# Patient Record
Sex: Male | Born: 1980 | Race: White | Hispanic: No | Smoking: Never smoker
Health system: Southern US, Community
[De-identification: ages and names within clinical notes are randomized; demographics above are authoritative.]

## PROBLEM LIST (undated history)

## (undated) DIAGNOSIS — F319 Bipolar disorder, unspecified: Secondary | ICD-10-CM

## (undated) DIAGNOSIS — C9000 Multiple myeloma not having achieved remission: Secondary | ICD-10-CM

## (undated) DIAGNOSIS — F32A Depression, unspecified: Secondary | ICD-10-CM

## (undated) DIAGNOSIS — I10 Essential (primary) hypertension: Secondary | ICD-10-CM

## (undated) DIAGNOSIS — R7989 Other specified abnormal findings of blood chemistry: Secondary | ICD-10-CM

## (undated) DIAGNOSIS — N289 Disorder of kidney and ureter, unspecified: Secondary | ICD-10-CM

## (undated) DIAGNOSIS — I639 Cerebral infarction, unspecified: Secondary | ICD-10-CM

## (undated) DIAGNOSIS — G8929 Other chronic pain: Secondary | ICD-10-CM

## (undated) DIAGNOSIS — F4310 Post-traumatic stress disorder, unspecified: Secondary | ICD-10-CM

## (undated) DIAGNOSIS — F329 Major depressive disorder, single episode, unspecified: Secondary | ICD-10-CM

## (undated) DIAGNOSIS — I38 Endocarditis, valve unspecified: Secondary | ICD-10-CM

## (undated) HISTORY — PX: LEG SURGERY: SHX1003

## (undated) HISTORY — PX: HERNIA REPAIR: SHX51

## (undated) HISTORY — PX: OTHER SURGICAL HISTORY: SHX169

---

## 2005-03-24 ENCOUNTER — Emergency Department (HOSPITAL_COMMUNITY): Admission: EM | Admit: 2005-03-24 | Discharge: 2005-03-25 | Payer: Self-pay | Admitting: Emergency Medicine

## 2006-01-28 ENCOUNTER — Emergency Department (HOSPITAL_COMMUNITY): Admission: EM | Admit: 2006-01-28 | Discharge: 2006-01-29 | Payer: Self-pay | Admitting: Emergency Medicine

## 2008-02-07 ENCOUNTER — Observation Stay (HOSPITAL_COMMUNITY): Admission: EM | Admit: 2008-02-07 | Discharge: 2008-02-07 | Payer: Self-pay | Admitting: Emergency Medicine

## 2008-04-04 ENCOUNTER — Emergency Department (HOSPITAL_COMMUNITY): Admission: EM | Admit: 2008-04-04 | Discharge: 2008-04-05 | Payer: Self-pay | Admitting: Emergency Medicine

## 2009-07-30 IMAGING — CT CT HEAD W/O CM
1 of 3 series · 13 of 30 positions shown, 17 images · non-contrast
Comparison: None

CLINICAL DATA: Headache, hypertension

CT HEAD WITHOUT CONTRAST
TECHNIQUE: Contiguous axial images were obtained from the base of
the skull through the vertex without contrast.

[Series 2: head_seq 4.5 h37s st · axial · 0.47mm/px · z∈[+933,+1068]mm · 13 of 36 slices shown, 17 images]
[im 3/36  brain]
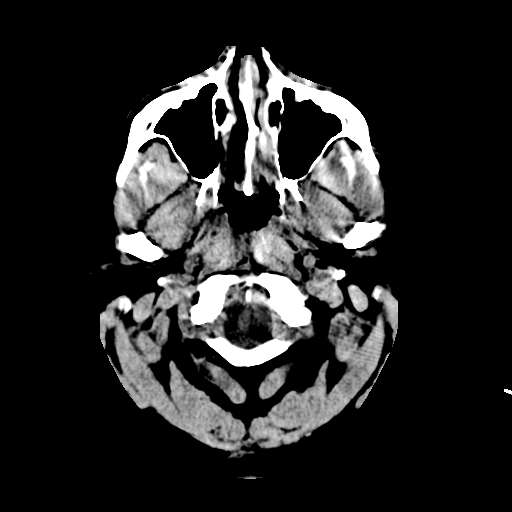
[im 3/36  bone]
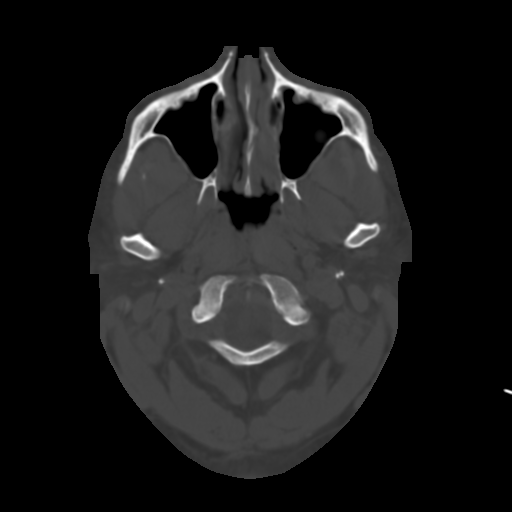
[im 6/36  brain]
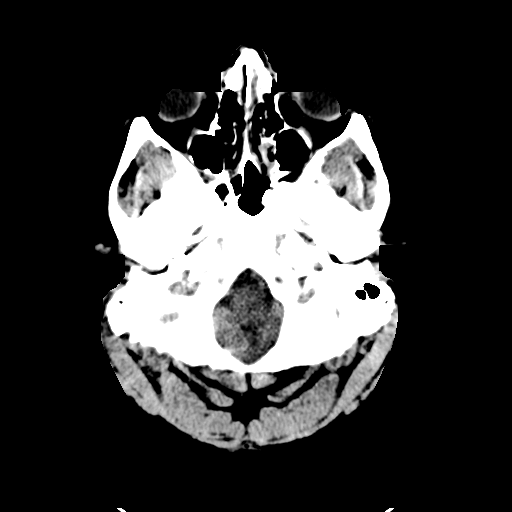
[im 8/36  brain]
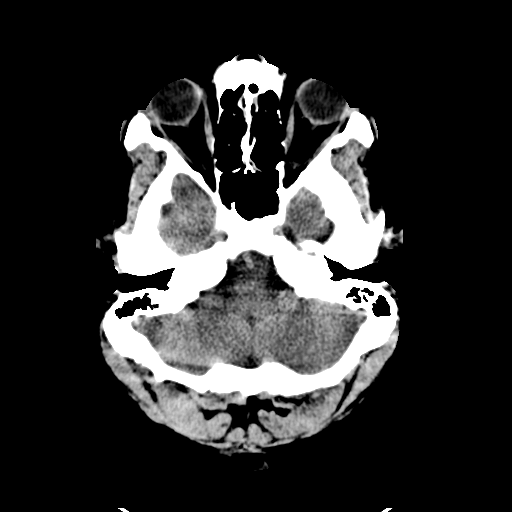
[im 11/36  brain]
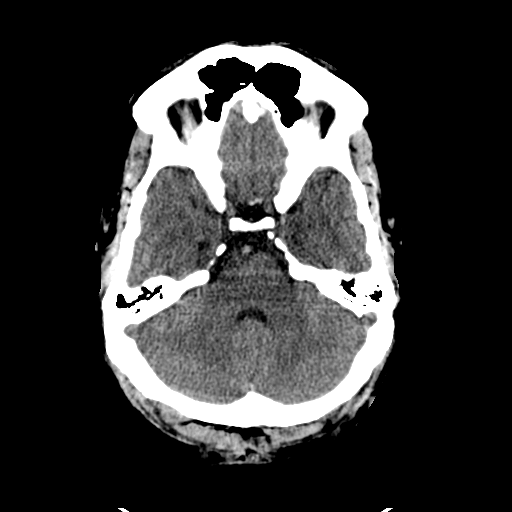
[im 13/36  brain]
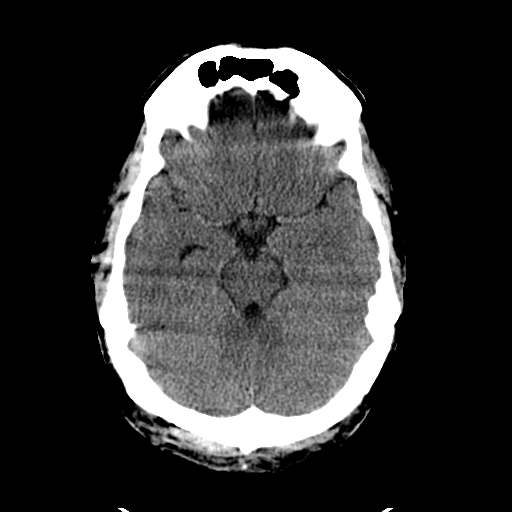
[im 13/36  bone]
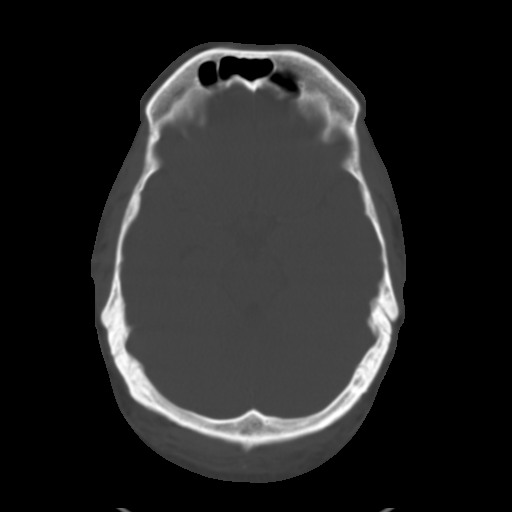
[im 16/36  brain]
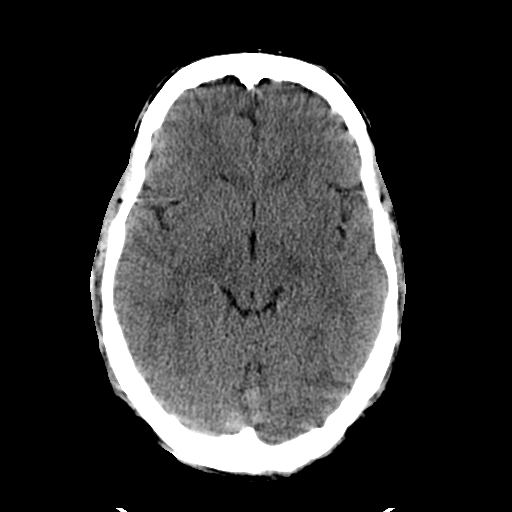
[im 18/36  brain]
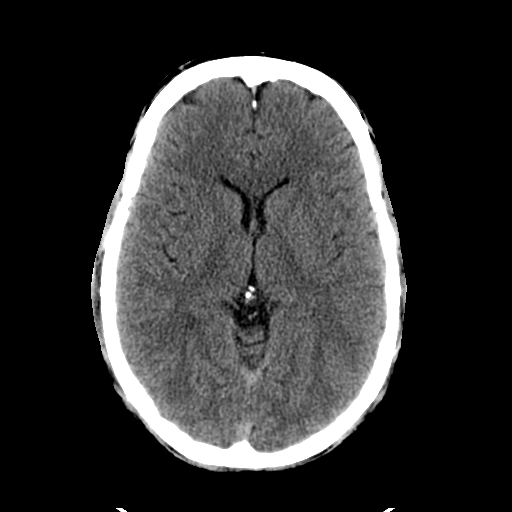
[im 21/36  brain]
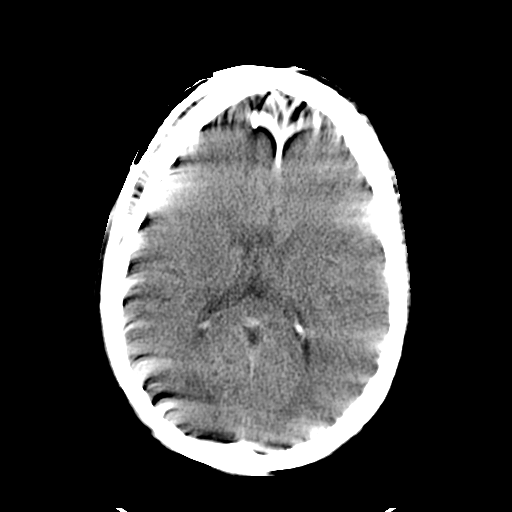
[im 23/36  brain]
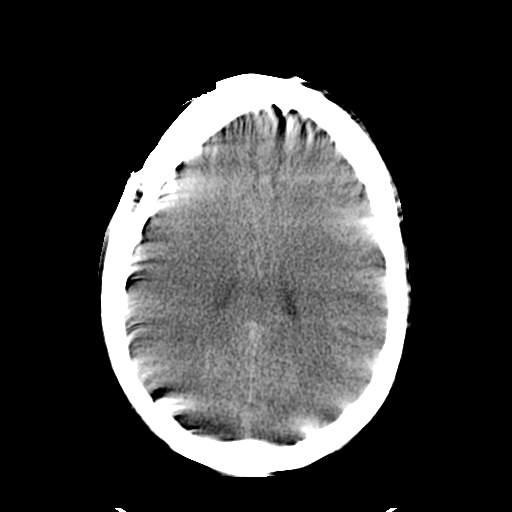
[im 23/36  bone]
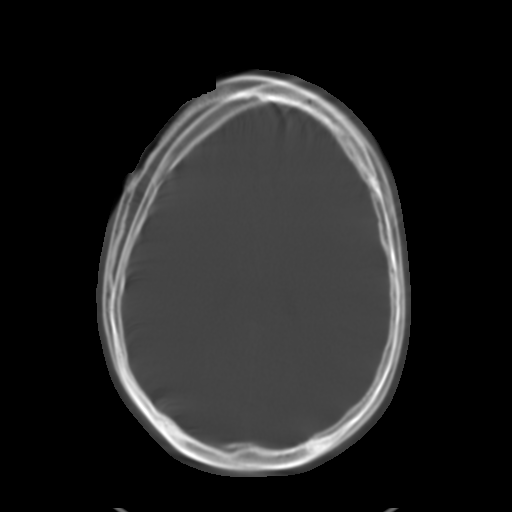
[im 26/36  brain]
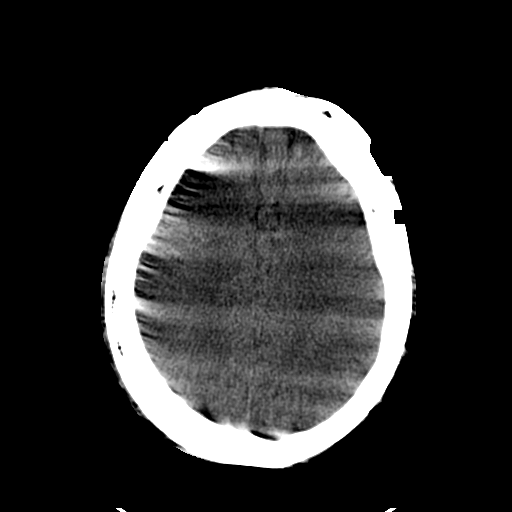
[im 28/36  brain]
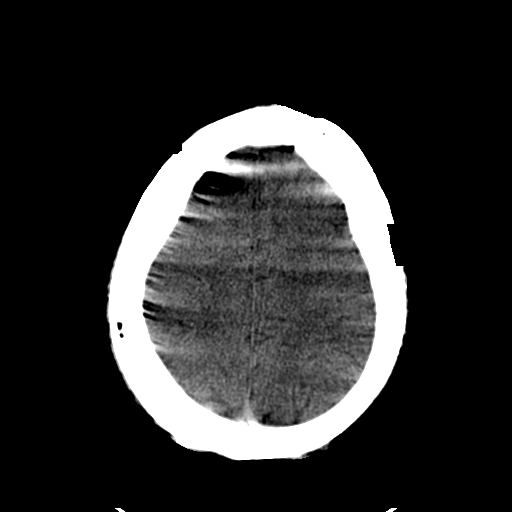
[im 31/36  brain]
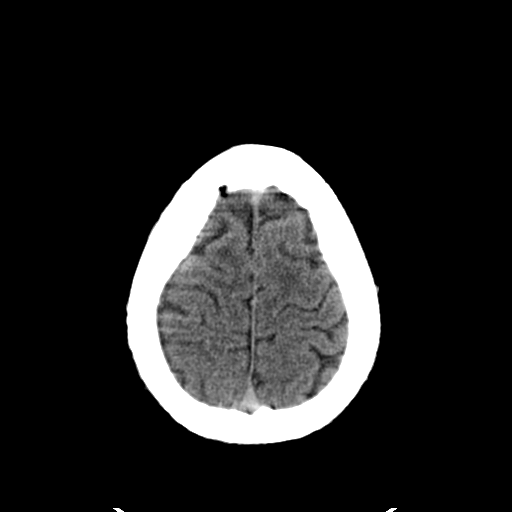
[im 33/36  brain]
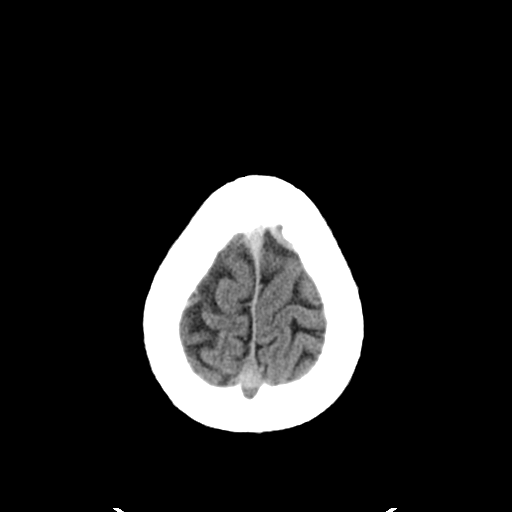
[im 33/36  bone]
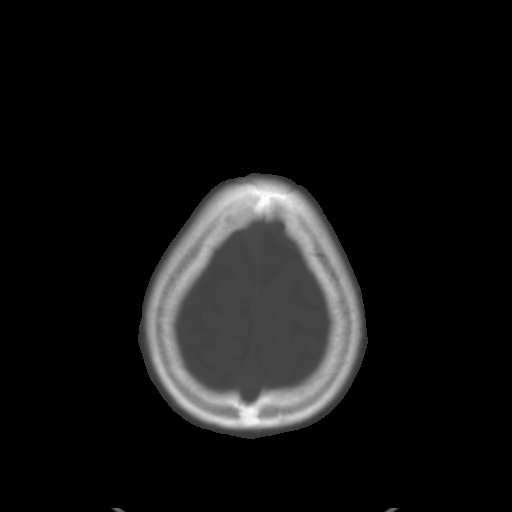

[13 of 30 positions shown; findings below may reference images not displayed]

FINDINGS: Motion artifacts, for which repeat imaging was performed.
Normal ventricular morphology.
No midline shift or mass effect.
Normal appearance of brain parenchyma.
No intracranial hemorrhage, mass lesion, or acute infarct.
Mucosal thickening throughout ethmoid air cells.
Small mucosal retention cyst in left maxillary sinus.
Bones unremarkable.
IMPRESSION: No acute intracranial abnormalities.

## 2009-11-08 ENCOUNTER — Encounter: Admission: RE | Admit: 2009-11-08 | Discharge: 2009-11-08 | Payer: Self-pay | Admitting: Psychiatry

## 2010-05-23 ENCOUNTER — Emergency Department (HOSPITAL_COMMUNITY): Admission: EM | Admit: 2010-05-23 | Discharge: 2010-05-23 | Payer: Self-pay | Admitting: Emergency Medicine

## 2010-06-24 ENCOUNTER — Emergency Department (HOSPITAL_COMMUNITY)
Admission: EM | Admit: 2010-06-24 | Discharge: 2010-06-24 | Payer: Self-pay | Source: Home / Self Care | Admitting: Emergency Medicine

## 2010-09-26 LAB — DIFFERENTIAL
Basophils Absolute: 0.1 10*3/uL (ref 0.0–0.1)
Basophils Absolute: 0.1 10*3/uL (ref 0.0–0.1)
Basophils Relative: 1 % (ref 0–1)
Basophils Relative: 1 % (ref 0–1)
Eosinophils Absolute: 0.3 10*3/uL (ref 0.0–0.7)
Eosinophils Absolute: 0.4 10*3/uL (ref 0.0–0.7)
Eosinophils Relative: 3 % (ref 0–5)
Eosinophils Relative: 4 % (ref 0–5)
Lymphocytes Relative: 29 % (ref 12–46)
Lymphocytes Relative: 34 % (ref 12–46)
Lymphs Abs: 2.8 10*3/uL (ref 0.7–4.0)
Lymphs Abs: 3.5 10*3/uL (ref 0.7–4.0)
Monocytes Absolute: 0.7 10*3/uL (ref 0.1–1.0)
Monocytes Absolute: 1.3 10*3/uL — ABNORMAL HIGH (ref 0.1–1.0)
Monocytes Relative: 10 % (ref 3–12)
Monocytes Relative: 9 % (ref 3–12)
Neutro Abs: 4.3 10*3/uL (ref 1.7–7.7)
Neutro Abs: 6.7 10*3/uL (ref 1.7–7.7)
Neutrophils Relative %: 53 % (ref 43–77)
Neutrophils Relative %: 56 % (ref 43–77)

## 2010-09-26 LAB — COMPREHENSIVE METABOLIC PANEL
ALT: 20 U/L (ref 0–53)
ALT: 56 U/L — ABNORMAL HIGH (ref 0–53)
AST: 14 U/L (ref 0–37)
AST: 33 U/L (ref 0–37)
Albumin: 3.5 g/dL (ref 3.5–5.2)
Albumin: 4.2 g/dL (ref 3.5–5.2)
Alkaline Phosphatase: 49 U/L (ref 39–117)
Alkaline Phosphatase: 67 U/L (ref 39–117)
BUN: 10 mg/dL (ref 6–23)
BUN: 9 mg/dL (ref 6–23)
CO2: 29 meq/L (ref 19–32)
CO2: 31 mEq/L (ref 19–32)
Calcium: 10 mg/dL (ref 8.4–10.5)
Calcium: 9.5 mg/dL (ref 8.4–10.5)
Chloride: 101 meq/L (ref 96–112)
Chloride: 105 meq/L (ref 96–112)
Creatinine, Ser: 0.87 mg/dL (ref 0.4–1.5)
Creatinine, Ser: 0.89 mg/dL (ref 0.4–1.5)
GFR calc Af Amer: >60 mL/min (ref >60)
GFR calc Af Amer: >60 mL/min (ref >60)
GFR calc non Af Amer: >60 mL/min (ref >60)
GFR calc non Af Amer: >60 mL/min (ref >60)
Glucose, Bld: 107 mg/dL — ABNORMAL HIGH (ref 70–99)
Glucose, Bld: 82 mg/dL (ref 70–99)
Potassium: 3.6 meq/L (ref 3.5–5.1)
Potassium: 3.7 meq/L (ref 3.5–5.1)
Sodium: 141 mEq/L (ref 135–145)
Sodium: 142 mEq/L (ref 135–145)
Total Bilirubin: 0.8 mg/dL (ref 0.3–1.2)
Total Bilirubin: 1 mg/dL (ref 0.3–1.2)
Total Protein: 6.4 g/dL (ref 6.0–8.3)
Total Protein: 7.4 g/dL (ref 6.0–8.3)

## 2010-09-26 LAB — CBC
HCT: 39.7 % (ref 39.0–52.0)
HCT: 41 % (ref 39.0–52.0)
Hemoglobin: 13.9 g/dL (ref 13.0–17.0)
Hemoglobin: 14.5 g/dL (ref 13.0–17.0)
MCH: 30.4 pg (ref 26.0–34.0)
MCH: 30.5 pg (ref 26.0–34.0)
MCHC: 35.1 g/dL (ref 30.0–36.0)
MCHC: 35.4 g/dL (ref 30.0–36.0)
MCV: 86 fL (ref 78.0–100.0)
MCV: 86.9 fL (ref 78.0–100.0)
Platelets: 285 10*3/uL (ref 150–400)
Platelets: 288 10*3/uL (ref 150–400)
RBC: 4.57 MIL/uL (ref 4.22–5.81)
RBC: 4.77 MIL/uL (ref 4.22–5.81)
RDW: 13.5 % (ref 11.5–15.5)
RDW: 14.1 % (ref 11.5–15.5)
WBC: 12 10*3/uL — ABNORMAL HIGH (ref 4.0–10.5)
WBC: 8.2 10*3/uL (ref 4.0–10.5)

## 2010-09-26 LAB — URINALYSIS, ROUTINE W REFLEX MICROSCOPIC
Bilirubin Urine: NEGATIVE
Glucose, UA: NEGATIVE mg/dL
Hgb urine dipstick: NEGATIVE
Ketones, ur: NEGATIVE mg/dL
Nitrite: NEGATIVE
Protein, ur: NEGATIVE mg/dL
Specific Gravity, Urine: 1.02 (ref 1.005–1.030)
Urobilinogen, UA: 0.2 mg/dL (ref 0.0–1.0)
pH: 5.5 (ref 5.0–8.0)

## 2010-09-26 LAB — RAPID URINE DRUG SCREEN, HOSP PERFORMED
Amphetamines: NOT DETECTED
Barbiturates: NOT DETECTED
Benzodiazepines: POSITIVE — AB
Cocaine: NOT DETECTED
Opiates: POSITIVE — AB
Tetrahydrocannabinol: NOT DETECTED

## 2010-09-26 LAB — URINE CULTURE
Colony Count: 15000
Culture  Setup Time: 201111090114

## 2010-09-26 LAB — ETHANOL: Alcohol, Ethyl (B): <5 mg/dL (ref 0–10)

## 2010-09-26 LAB — LIPASE, BLOOD: Lipase: 20 U/L (ref 11–59)

## 2010-09-26 LAB — ACETAMINOPHEN LEVEL: Acetaminophen (Tylenol), Serum: <10 ug/mL — ABNORMAL LOW (ref 10–30)

## 2010-11-28 NOTE — H&P (Signed)
Ronald Hampton, Ronald Hampton NO.:  [id number]   MEDICAL RECORD NO.:  [id number]          PATIENT TYPE:  EMS   LOCATION:  MAJO                         FACILITY:  [facility]   PHYSICIAN:  Lucita Ferrara, MD         DATE OF BIRTH:  19-Mar-1981   DATE OF ADMISSION:  02/06/2008  DATE OF DISCHARGE:                              HISTORY & PHYSICAL   HISTORY OF PRESENT ILLNESS:  The patient is a 30 year old unassigned  patient with severe abdominal pain.  Abdominal pain started suddenly and  acutely located in his left lower abdomen inguinal area, worse upon  cough and increase in intrathoracic pressure, 8/10 in intensity.  The  condition is aggravated by palpation, relieved by rest and sitting down  still.  The patient has had associated diarrhea, nausea and vomiting.  The patient has had no dysuria, flank pain, hematuria or testicular  pain.  There is no known trauma.   PAST MEDICAL HISTORY:  1. Hypertension.  2. Depression.   PAST SURGICAL HISTORY:  None.   SOCIAL HISTORY:  Denies drugs alcohol or tobacco.   ALLERGIES:  NO KNOWN DRUG ALLERGIES.   MEDICATIONS:  1. Lamictal.  2. Ambien.   PHYSICAL EXAMINATION:  GENERAL:  The patient is currently in better  comfort secondary to pain medications for the severe pain.  VITAL SIGNS:  Blood pressure is 134/92, pulse Ranging from 99-125,  respirations 20, temperature 97.40, pulse ox 97%.  CARDIOVASCULAR:  S1,  S2, regular rate and rhythm.  LUNGS:  Clear to auscultation bilaterally.  No rhonchi, rales or  wheezes.  Abdomen is soft.  There is left lower quadrant tenderness.  Tenderness is severe.  There is no mass.  There is no obvious herniation  that is felt.  Normal bowel sounds.  There is no guarding.  GENITOURINARY:  The scrotum is normal bilaterally.  There is no obvious  masses that are felt.   EMERGENCY ROOM COURSE:  A CT scan of the abdomen and testicular  ultrasound were ordered and results discussed with Dr. Lindie Spruce from  Surgery who actually officially came and saw the CT scan.  There is no  evidence of hernia per him, and the patient did not feel he had any  surgical issues at this time.  In review of the results, ultrasound of  the scrotum shows a small cyst in the right epididymis, questionable  epididymal cyst versus spermatocele.  CT scan without contrast media  shows no acute upper abdominal abnormalities.  There is fatty  infiltration.  CT scan of the pelvis shows bilateral spondylolysis.  No  acute intrapelvic abnormality.  Urinalysis is negative.  CBC is  pending..   ASSESSMENT/PLAN:  A 30 year old otherwise healthy male with severe left  lower quadrant inguinal abdominal pain, nausea and vomiting that is now  subsided.  Symptoms are none-surgical per Dr. Lindie Spruce in the emergency  room.  CT scan of the abdomen and pelvis essentially negative.  Ultrasound of the test shows no testicular torsion.  There is positive  spermatocele versus epididymal cyst.   Other stable medical problems including hypertension  and depression.   PLAN:  Will go ahead and admit the patient to medical surgical floor.  Will get a CBC.  Pain control with IV Dilaudid.  Urology consultation in  the morning.  The patient will likely be able to get discharged home on  pain medications in the next 1-2 days.      Lucita Ferrara, MD  Electronically Signed     RR/MEDQ  D:  02/07/2008  T:  02/07/2008  Job:  [id number]

## 2010-11-28 NOTE — Discharge Summary (Signed)
Ronald Hampton, Ronald Hampton NO.:  [id number]   MEDICAL RECORD NO.:  [id number]          PATIENT TYPE:  INP   LOCATION:  5120                         FACILITY:  [facility]   PHYSICIAN:  Altha Harm, MDDATE OF BIRTH:  [birth year]   DATE OF ADMISSION:  02/06/2008  DATE OF DISCHARGE:  02/07/2008                               DISCHARGE SUMMARY   DISCHARGE DISPOSITION:  Home.   FINAL DISCHARGE DIAGNOSES:  1. Right inguinal pain, query hernia.  2. Right epididymal cyst.  3. Chronic right lower quadrant pain.  4. Insomnia.  5. History of uncontrolled hypertension.   DISCHARGE MEDICATIONS:  1. Vicodin 1 tab p.o. q 6hrs as needed.  2. Lamictal 200 mg p.o. b.i.d.  3. Lexapro 20 mg p.o. daily.  4. Aspirin CR 1 tab p.o. nightly p.r.n.   CONSULTANTS:  1. Dr. Lynne Logan.  2. Dr. Nesi--urology.   PROCEDURES:  None.   DIAGNOSTIC STUDIES:  1. CT of the abdomen and pelvis without contrast, which shows the      following:  No acute abdominal abnormalities, fatty infiltrates to      the liver.  2. Bilateral spondylosis of L5, no acute intrapelvic abnormalities.  3. Ultrasound of the scrotum which shows small cyst in the right      epididymis.  Question epididymal cyst versus dermatocele.      Otherwise unremarkable scrotal ultrasound.   CODE STATUS:  Full code.   ALLERGIES:  NO KNOWN DRUG ALLERGIES.   CHIEF COMPLAINT:  Left lower quadrant inguinal pain.   HISTORY OF PRESENT ILLNESS:  Please see the H and P dictated by Dr.  Flonnie Overman for details of the HPI.   HOSPITAL COURSE:  This a patient who presented to the emergency room for  the second time in 1 week with complaints with left inguinal pain.  The  patient was evaluated in the emergency room by Dr. Lindie Spruce, surgery, and  per Dr. Flonnie Overman who admitted the patient last night.  Dr. Dixon Boos opinion  was that the patient did not require surgery at this time.   He, however, did recommend the patient be seen by urology for  evaluation  of the cyst found on ultrasound.  The patient apparently was having  significant pain and was admitted for pain control.  During this  hospitalization, the patient was seen and evaluated by Dr. Brunilda Payor,  urology, who felt that there was no intervention needed at this time and  I recommended that the patient follow back up with the surgeon.  The  patient had had no abnormalities in his labs.  He has had no problems  with urination.  He has had no problems with bowel movement.  He have  been functioning and able to ambulate without any difficulty.  The  patient has had no difficulty with eating, with drinking, and was  tolerating his food well.  Currently the patient has no acute issue  requiring hospitalization.  The patient has been advised of this primary  care physician and to go back to the surgeon for his hernia for clinical  follow up and possibility of arranging  his surgery electively.   CONDITION ON DISCHARGE:  The patient is afebrile.   VITAL SIGNS:  Temperature 98.8.  Heart rate 71.  Blood pressure 131/78.  Respiratory rate 50.  O2 sats 100% on room air.  LUNGS:  Clear to auscultation.  He has got no wheezes or rhonchi.  He  has got a normal S1 and S2, no murmurs, rubs, or gallops noted.  ABDOMEN:  Soft, nontender, nondistended.  He has no left inguinal  tenderness.  NEUROLOGIC:  He has no focal neurological deficits.  MUSCULOSKELETAL:  From a musculoskeletal stand point the patient is  doing apparently functionally.   DIETARY RESTRICTION:  None.   PHYSICAL RESTRICTIONS:  The patient has been advised against lifting.   FOLLOWUP:  The patient is to follow up with his primary care physician  for 3-5 days and specifically for referral back to his surgeon to follow  him up with his hernia.      Altha Harm, MD  Electronically Signed     MAM/MEDQ  D:  02/07/2008  T:  02/07/2008  Job:  [id number]

## 2010-11-28 NOTE — Consult Note (Signed)
Ronald Hampton, Ronald Hampton             ACCOUNT NO.:  [id number]   MEDICAL RECORD NO.:  [id number]          PATIENT TYPE:  INP   LOCATION:  5120                         FACILITY:  [facility]   PHYSICIAN:  Lindaann Slough, M.D.  DATE OF BIRTH:  07-Nov-1980   DATE OF CONSULTATION:  02/07/2008  DATE OF DISCHARGE:  02/07/2008                                 CONSULTATION   REASON FOR CONSULTATION:  Left groin pain and right testicular cysts.   The patient is a 30 year old male who was admitted last night with  history of severe left lower quadrant pain associated with nausea and  vomiting.  The patient states that he has been having left groin pain on  and off for several months.  After cutting grass yesterday, he had  severe left lower quadrant pain associated with nausea and vomiting.  He  came to the emergency room.  A CT scan showed no acute abdominal or  pelvic abnormalities.  Scrotal ultrasound showed no evidence of torsion,  normal testes, and right epididymal cysts, and the largest one measures  8 x 7 x 6 mm.  He was treated with analgesics; however, he still  complains of pain, and he now has pain in both testicles and left lower  quadrant, and he is requesting more pain medication.  He also complains  of decreased stream, straining on urination, and nocturia x1, although  at first, he had stated that he did not have any voiding symptoms.  He  has been having diarrhea for 3 weeks.  He had bilateral inguinal hernia  repairs in the past.   PAST MEDICAL HISTORY:  Positive for depression.   PAST SURGICAL HISTORY:  Bilateral inguinal hernia repairs.   MEDICATIONS:  Lamictal and Lexapro.   SOCIAL HISTORY:  Single, does not have any children.  Does not smoke.  Drinks occasionally.   ALLERGIES:  No known drug allergies.   FAMILY HISTORY:  His paternal grandfather and his father have history of  prostate cancer.  Maternal grandfather has history of heart disease  and  died of a heart  attack.   REVIEW OF SYSTEMS:  As noted in the HPI and it is otherwise negative.   PHYSICAL EXAMINATION:  This is a well-developed 30 year old male who is  complaining of severe left lower quadrant pain.  Temperature is 97.7,  blood pressure is 143/84, pulse 101, respirations 19.  He is alert and  oriented.  Skin is warm and dry.  Abdomen is soft and nondistended, but  tender in the left lower quadrant.  There is no rebound tenderness.  There is no hepatomegaly and no splenomegaly.  Kidneys are not palpable.  Bladder is not distended.  He has no inguinal adenopathy.   Penis is circumcised.  The meatus is normal.  Scrotum is normal in  appearance.  Both testicles feel normal.  There is no testicular mass.  I cannot feel the epididymal cyst on the right side, and there is no  evidence of epididymitis on the right, nor on the left side, and both  cords are normal.   Scrotal ultrasound shows normal testes,  good flow to both testicles, and  right epididymal cyst.   IMPRESSION:  Right epididymal cyst by ultrasound, left groin pain.   SUGGESTION:  I doubt that his symptoms are related to the epididymal  cyst.  He does not need surgical management of this cyst at this time.  We can follow him as an outpatient.      Lindaann Slough, M.D.  Electronically Signed     MN/MEDQ  D:  02/07/2008  T:  02/07/2008  Job:  [id number]

## 2011-04-13 LAB — COMPREHENSIVE METABOLIC PANEL
ALT: 49
AST: 28
Albumin: 4.5
Alkaline Phosphatase: 57
BUN: 7
CO2: 23
Calcium: 9.3
Chloride: 106
Creatinine, Ser: 0.91
GFR calc Af Amer: >60
GFR calc non Af Amer: >60
Glucose, Bld: 110 — ABNORMAL HIGH
Potassium: 3.7
Sodium: 138
Total Bilirubin: 1.2
Total Protein: 6.9

## 2011-04-13 LAB — CBC
HCT: 43.6
Hemoglobin: 14.9
MCHC: 34.2
MCV: 88.8
Platelets: 267
RBC: 4.91
RDW: 14.5
WBC: 9.8

## 2011-04-13 LAB — URINALYSIS, ROUTINE W REFLEX MICROSCOPIC
Bilirubin Urine: NEGATIVE
Glucose, UA: NEGATIVE
Hgb urine dipstick: NEGATIVE
Ketones, ur: NEGATIVE
Nitrite: NEGATIVE
Protein, ur: NEGATIVE
Specific Gravity, Urine: 1.026
Urobilinogen, UA: 0.2
pH: 5

## 2011-04-13 LAB — DIFFERENTIAL
Basophils Absolute: 0.1
Basophils Relative: 1
Eosinophils Absolute: 0.2
Eosinophils Relative: 2
Lymphocytes Relative: 30
Lymphs Abs: 2.9
Monocytes Absolute: 0.8
Monocytes Relative: 9
Neutro Abs: 5.7
Neutrophils Relative %: 59

## 2011-04-13 LAB — LACTIC ACID, PLASMA: Lactic Acid, Venous: 1.4

## 2011-04-16 LAB — URINALYSIS, ROUTINE W REFLEX MICROSCOPIC
Bilirubin Urine: NEGATIVE
Glucose, UA: 100 — AB
Hgb urine dipstick: NEGATIVE
Leukocytes, UA: NEGATIVE
Nitrite: NEGATIVE
Protein, ur: 100 — AB
Specific Gravity, Urine: 1.035 — ABNORMAL HIGH
Urobilinogen, UA: 1
pH: 5.5

## 2011-04-16 LAB — POCT I-STAT, CHEM 8
BUN: 13
Calcium, Ion: 1.17
Chloride: 102
Creatinine, Ser: 0.9
Glucose, Bld: 203 — ABNORMAL HIGH
HCT: 49
Hemoglobin: 16.7
Potassium: 3.9
Sodium: 135
TCO2: 25

## 2011-04-16 LAB — CBC
HCT: 47
Hemoglobin: 16.3
MCHC: 34.6
MCV: 88.5
Platelets: 291
RBC: 5.31
RDW: 13.7
WBC: 13.3 — ABNORMAL HIGH

## 2011-04-16 LAB — DIFFERENTIAL
Basophils Absolute: 0.1
Basophils Relative: 1
Eosinophils Absolute: 0
Eosinophils Relative: 0
Lymphocytes Relative: 13
Lymphs Abs: 1.7
Monocytes Absolute: 1
Monocytes Relative: 7
Neutro Abs: 10.5 — ABNORMAL HIGH
Neutrophils Relative %: 79 — ABNORMAL HIGH

## 2011-04-16 LAB — URINE MICROSCOPIC-ADD ON

## 2012-11-16 ENCOUNTER — Encounter (HOSPITAL_COMMUNITY): Payer: Self-pay | Admitting: *Deleted

## 2012-11-16 ENCOUNTER — Emergency Department (HOSPITAL_COMMUNITY)
Admission: EM | Admit: 2012-11-16 | Discharge: 2012-11-17 | Disposition: A | Discharge status: Other Acute Inpatient Hospital | Payer: Self-pay | Attending: Emergency Medicine | Admitting: Emergency Medicine

## 2012-11-16 ENCOUNTER — Emergency Department (HOSPITAL_COMMUNITY): Payer: Federal, State, Local not specified - Other

## 2012-11-16 DIAGNOSIS — M79609 Pain in unspecified limb: Secondary | ICD-10-CM | POA: Insufficient documentation

## 2012-11-16 DIAGNOSIS — R45851 Suicidal ideations: Secondary | ICD-10-CM

## 2012-11-16 DIAGNOSIS — F411 Generalized anxiety disorder: Secondary | ICD-10-CM | POA: Insufficient documentation

## 2012-11-16 DIAGNOSIS — F3289 Other specified depressive episodes: Secondary | ICD-10-CM | POA: Insufficient documentation

## 2012-11-16 DIAGNOSIS — M79672 Pain in left foot: Secondary | ICD-10-CM

## 2012-11-16 DIAGNOSIS — R7401 Elevation of levels of liver transaminase levels: Secondary | ICD-10-CM | POA: Insufficient documentation

## 2012-11-16 DIAGNOSIS — Z008 Encounter for other general examination: Secondary | ICD-10-CM | POA: Insufficient documentation

## 2012-11-16 DIAGNOSIS — R7402 Elevation of levels of lactic acid dehydrogenase (LDH): Secondary | ICD-10-CM | POA: Insufficient documentation

## 2012-11-16 DIAGNOSIS — F32A Depression, unspecified: Secondary | ICD-10-CM

## 2012-11-16 LAB — RAPID URINE DRUG SCREEN, HOSP PERFORMED
Amphetamines: NOT DETECTED
Barbiturates: NOT DETECTED
Benzodiazepines: NOT DETECTED
Cocaine: NOT DETECTED
Opiates: POSITIVE — AB
Tetrahydrocannabinol: NOT DETECTED

## 2012-11-16 LAB — CBC WITH DIFFERENTIAL/PLATELET
Basophils Absolute: 0.1 10*3/uL (ref 0.0–0.1)
Basophils Relative: 1 % (ref 0–1)
Eosinophils Absolute: 0.2 10*3/uL (ref 0.0–0.7)
Eosinophils Relative: 2 % (ref 0–5)
HCT: 41.2 % (ref 39.0–52.0)
Hemoglobin: 14.9 g/dL (ref 13.0–17.0)
Lymphocytes Relative: 27 % (ref 12–46)
Lymphs Abs: 2.8 10*3/uL (ref 0.7–4.0)
MCH: 29 pg (ref 26.0–34.0)
MCHC: 36.2 g/dL — ABNORMAL HIGH (ref 30.0–36.0)
MCV: 80.2 fL (ref 78.0–100.0)
Monocytes Absolute: 0.8 10*3/uL (ref 0.1–1.0)
Monocytes Relative: 7 % (ref 3–12)
Neutro Abs: 6.6 10*3/uL (ref 1.7–7.7)
Neutrophils Relative %: 64 % (ref 43–77)
Platelets: 238 10*3/uL (ref 150–400)
RBC: 5.14 MIL/uL (ref 4.22–5.81)
RDW: 14.3 % (ref 11.5–15.5)
WBC: 10.5 10*3/uL (ref 4.0–10.5)

## 2012-11-16 LAB — COMPREHENSIVE METABOLIC PANEL
ALT: 249 U/L — ABNORMAL HIGH (ref 0–53)
AST: 118 U/L — ABNORMAL HIGH (ref 0–37)
Albumin: 4 g/dL (ref 3.5–5.2)
Alkaline Phosphatase: 94 U/L (ref 39–117)
BUN: 9 mg/dL (ref 6–23)
CO2: 25 meq/L (ref 19–32)
Calcium: 9.7 mg/dL (ref 8.4–10.5)
Chloride: 103 meq/L (ref 96–112)
Creatinine, Ser: 0.7 mg/dL (ref 0.50–1.35)
GFR calc Af Amer: >90 mL/min (ref >90)
GFR calc non Af Amer: >90 mL/min (ref >90)
Glucose, Bld: 156 mg/dL — ABNORMAL HIGH (ref 70–99)
Potassium: 3.4 meq/L — ABNORMAL LOW (ref 3.5–5.1)
Sodium: 138 meq/L (ref 135–145)
Total Bilirubin: 0.9 mg/dL (ref 0.3–1.2)
Total Protein: 7.7 g/dL (ref 6.0–8.3)

## 2012-11-16 LAB — ETHANOL: Alcohol, Ethyl (B): <11 mg/dL (ref 0–11)

## 2012-11-16 MED ADMIN — Hydrocodone-Acetaminophen Tab 5-325 MG: 1 | ORAL | NDC 00406036523

## 2012-11-16 MED FILL — Hydrocodone-Acetaminophen Tab 5-325 MG: 1.0000 | ORAL | Qty: 1 | Status: AC

## 2012-11-17 ENCOUNTER — Encounter (HOSPITAL_COMMUNITY): Payer: Self-pay | Admitting: *Deleted

## 2012-11-17 ENCOUNTER — Inpatient Hospital Stay (HOSPITAL_COMMUNITY)
Admission: AD | Admit: 2012-11-17 | Discharge: 2012-11-27 | DRG: 885 | Disposition: A | Discharge status: Home / Self Care | Payer: Federal, State, Local not specified - Other | Source: Ambulatory Visit | Attending: Psychiatry | Admitting: Psychiatry

## 2012-11-17 ENCOUNTER — Encounter (HOSPITAL_COMMUNITY): Payer: Self-pay

## 2012-11-17 DIAGNOSIS — F329 Major depressive disorder, single episode, unspecified: Secondary | ICD-10-CM

## 2012-11-17 DIAGNOSIS — R45851 Suicidal ideations: Secondary | ICD-10-CM

## 2012-11-17 DIAGNOSIS — F333 Major depressive disorder, recurrent, severe with psychotic symptoms: Principal | ICD-10-CM

## 2012-11-17 DIAGNOSIS — Z79899 Other long term (current) drug therapy: Secondary | ICD-10-CM

## 2012-11-17 MED ADMIN — Ibuprofen Tab 200 MG: 400 mg | ORAL | NDC 63739013401

## 2012-11-17 MED ADMIN — Zolpidem Tartrate Tab 10 MG: 10 mg | ORAL | NDC 51079072401

## 2012-11-17 MED ADMIN — Ondansetron HCl Tab 4 MG: 4 mg | ORAL | NDC 51079052401

## 2012-11-17 MED ADMIN — Oxycodone HCl Tab 5 MG: 5 mg | ORAL | NDC 68084035411

## 2012-11-17 MED ADMIN — Alprazolam Tab 0.5 MG: 0.5 mg | ORAL | NDC 00904585961

## 2012-11-17 MED ADMIN — Nicotine TD Patch 24HR 21 MG/24HR: 21 mg | TRANSDERMAL | NDC 00067512607

## 2012-11-17 MED FILL — Clonidine HCl Tab 0.1 MG: 0.1000 mg | ORAL | Qty: 1 | Status: AC

## 2012-11-17 MED FILL — Quetiapine Fumarate Tab 50 MG: 50.0000 mg | ORAL | Qty: 1 | Status: AC

## 2012-11-17 MED FILL — Quetiapine Fumarate Tab 50 MG: 50.0000 mg | ORAL | Qty: 1 | Status: CN

## 2012-11-17 MED FILL — Naproxen Tab 500 MG: 500.0000 mg | ORAL | Qty: 1 | Status: AC

## 2012-11-17 MED FILL — Ibuprofen Tab 200 MG: 400.0000 mg | ORAL | Qty: 2 | Status: AC

## 2012-11-17 MED FILL — Methocarbamol Tab 500 MG: 500.0000 mg | ORAL | Qty: 1 | Status: AC

## 2012-11-17 MED FILL — Methocarbamol Tab 500 MG: 500.0000 mg | ORAL | Qty: 1 | Status: CN

## 2012-11-17 MED FILL — Oxycodone HCl Tab 5 MG: 5.0000 mg | ORAL | Qty: 1 | Status: AC

## 2012-11-17 MED FILL — Nicotine TD Patch 24HR 14 MG/24HR: 14.0000 mg | TRANSDERMAL | Qty: 1 | Status: AC

## 2012-11-17 MED FILL — Nicotine TD Patch 24HR 14 MG/24HR: 14.0000 mg | TRANSDERMAL | Qty: 1 | Status: CN

## 2012-11-17 MED FILL — Alprazolam Tab 0.5 MG: 0.5000 mg | ORAL | Qty: 1 | Status: AC

## 2012-11-17 MED FILL — Zolpidem Tartrate Tab 5 MG: 10.0000 mg | ORAL | Qty: 1 | Status: AC

## 2012-11-17 MED FILL — Clonidine TD Patch Weekly 0.1 MG/24HR: 0.1000 mg | TRANSDERMAL | Qty: 1 | Status: AC

## 2012-11-17 MED FILL — Ondansetron HCl Tab 4 MG: 4.0000 mg | ORAL | Qty: 1 | Status: AC

## 2012-11-17 MED FILL — Trazodone HCl Tab 50 MG: 50.0000 mg | ORAL | Qty: 1 | Status: AC

## 2012-11-17 MED FILL — Oxycodone w/ Acetaminophen Tab 5-325 MG: 1.0000 | ORAL | Qty: 2 | Status: AC

## 2012-11-18 DIAGNOSIS — F4310 Post-traumatic stress disorder, unspecified: Secondary | ICD-10-CM

## 2012-11-18 MED ADMIN — Naproxen Tab 500 MG: 500 mg | ORAL | NDC 68084012711

## 2012-11-18 MED ADMIN — Naproxen Tab 500 MG: 500 mg | ORAL | NDC 53746019001

## 2012-11-18 MED ADMIN — Clonidine HCl Tab 0.1 MG: 0.1 mg | ORAL | NDC 51079029901

## 2012-11-18 MED ADMIN — Sertraline HCl Tab 25 MG: 25 mg | ORAL | NDC 51079014920

## 2012-11-18 MED ADMIN — Oxycodone w/ Acetaminophen Tab 5-325 MG: 2 | ORAL | NDC 00406051223

## 2012-11-18 MED ADMIN — Hydroxyzine HCl Tab 25 MG: 25 mg | ORAL | NDC 63739048610

## 2012-11-18 MED ADMIN — Quetiapine Fumarate Tab 50 MG: 50 mg | ORAL | NDC 68084053111

## 2012-11-18 MED ADMIN — Prazosin HCl Cap 1 MG: 1 mg | ORAL | NDC 51079063001

## 2012-11-18 MED ADMIN — Clonidine HCl TD Patch Weekly 0.1 MG/24HR: 0.1 mg | TRANSDERMAL | NDC 00555100916

## 2012-11-18 MED FILL — Sertraline HCl Tab 25 MG: 25.0000 mg | ORAL | Qty: 1 | Status: AC

## 2012-11-18 MED FILL — Prazosin HCl Cap 1 MG: 1.0000 mg | ORAL | Qty: 1 | Status: AC

## 2012-11-18 MED FILL — Prazosin HCl Cap 1 MG: 1.0000 mg | ORAL | Qty: 1 | Status: CN

## 2012-11-18 MED FILL — Clonidine HCl Tab 0.1 MG: 0.1000 mg | ORAL | Qty: 1 | Status: AC

## 2012-11-18 MED FILL — Clonidine HCl Tab 0.1 MG: 0.1000 mg | ORAL | Qty: 1 | Status: CN

## 2012-11-18 MED FILL — Oxycodone w/ Acetaminophen Tab 5-325 MG: 1.0000 | ORAL | Qty: 2 | Status: AC

## 2012-11-19 DIAGNOSIS — F332 Major depressive disorder, recurrent severe without psychotic features: Secondary | ICD-10-CM

## 2012-11-19 DIAGNOSIS — F333 Major depressive disorder, recurrent, severe with psychotic symptoms: Secondary | ICD-10-CM | POA: Diagnosis present

## 2012-11-19 DIAGNOSIS — F411 Generalized anxiety disorder: Secondary | ICD-10-CM

## 2012-11-19 MED ADMIN — Prazosin HCl Cap 1 MG: 1 mg | ORAL | NDC 51079063001

## 2012-11-19 MED ADMIN — Oxycodone w/ Acetaminophen Tab 5-325 MG: 1 | ORAL | NDC 00054865024

## 2012-11-19 MED ADMIN — Naproxen Tab 500 MG: 500 mg | ORAL | NDC 68084012711

## 2012-11-19 MED ADMIN — Naproxen Tab 500 MG: 500 mg | ORAL | NDC 53746019001

## 2012-11-19 MED ADMIN — Clonidine HCl Tab 0.1 MG: 0.1 mg | ORAL | NDC 00904565661

## 2012-11-19 MED ADMIN — Clonidine HCl Tab 0.1 MG: 0.1 mg | ORAL | NDC 51079029901

## 2012-11-19 MED ADMIN — Quetiapine Fumarate Tab 100 MG: 100 mg | ORAL | NDC 68084053111

## 2012-11-19 MED ADMIN — Quetiapine Fumarate Tab 100 MG: 100 mg | ORAL | NDC 68084053211

## 2012-11-19 MED ADMIN — Sertraline HCl Tab 25 MG: 25 mg | ORAL | NDC 51079014920

## 2012-11-19 MED ADMIN — Oxycodone w/ Acetaminophen Tab 5-325 MG: 2 | ORAL | NDC 00054865024

## 2012-11-19 MED ADMIN — Hydroxyzine HCl Tab 25 MG: 25 mg | ORAL | NDC 63739048610

## 2012-11-19 MED ADMIN — Methocarbamol Tab 500 MG: 500 mg | ORAL | NDC 63739016610

## 2012-11-19 MED FILL — Prazosin HCl Cap 1 MG: 1.0000 mg | ORAL | Qty: 1 | Status: AC

## 2012-11-19 MED FILL — Prazosin HCl Cap 1 MG: 1.0000 mg | ORAL | Qty: 1 | Status: CN

## 2012-11-19 MED FILL — Multiple Vitamin Tab: 1.0000 | ORAL | Qty: 1 | Status: AC

## 2012-11-19 MED FILL — Quetiapine Fumarate Tab 100 MG: 100.0000 mg | ORAL | Qty: 1 | Status: AC

## 2012-11-19 MED FILL — Quetiapine Fumarate Tab 100 MG: 100.0000 mg | ORAL | Qty: 1 | Status: CN

## 2012-11-19 MED FILL — Oxycodone w/ Acetaminophen Tab 5-325 MG: 1.0000 | ORAL | Qty: 1 | Status: AC

## 2012-11-19 NOTE — Progress Notes (Signed)
[facility] MD Progress Note  11/19/2012 4:12 PM Ronald Hampton  MRN:  [id number] Subjective:  10/10 depression with no suicidal thoughts, 8/10 anxiety.  Patient requested an increase in his Seroquel for sleep since he could not have his Ambien.  He also asked for his ten o'clock medications to be moved to 8:30 to help him sleep better.  Frequently asking for PRN medications, he did state the Naprosyn is helping his pain in his left foot, xray did not identify any issues.  Marvie is going outside at this time and socializing on the unit appropriately.  Diagnosis:  Axis I: Anxiety Disorder NOS and Major Depression, Recurrent severe Axis II: Deferred Axis III: History reviewed. No pertinent past medical history. Axis IV: other psychosocial or environmental problems, problems related to social environment and problems with primary support group Axis V: 41-50 serious symptoms  ADL's:  Intact  Sleep: Fair  Appetite:  Fair  Suicidal Ideation:  Denies Homicidal Ideation:  Denies  Psychiatric Specialty Exam: Review of Systems  Constitutional: Negative.   HENT: Negative.   Eyes: Negative.   Respiratory: Negative.   Cardiovascular: Negative.   Gastrointestinal: Negative.   Genitourinary: Negative.   Musculoskeletal:       Left foot pain  Skin: Negative.   Neurological: Negative.   Endo/Heme/Allergies: Negative.   Psychiatric/Behavioral: Positive for depression. The patient is nervous/anxious.     Blood pressure 135/89, pulse 80, temperature 97.4 F (36.3 C), temperature source Oral, resp. rate 18, height 5\' 11"  (1.803 m).There is no weight on file to calculate BMI.  General Appearance: Casual  Eye Contact::  Fair  Speech:  Normal Rate  Volume:  Normal  Mood:  Anxious and Depressed  Affect:  Congruent  Thought Process:  Coherent  Orientation:  Full (Time, Place, and Person)  Thought Content:  WDL  Suicidal Thoughts:  No  Homicidal Thoughts:  No  Memory:  Immediate;   Fair Recent;    Fair Remote;   Fair  Judgement:  Fair  Insight:  Fair  Psychomotor Activity:  Decreased  Concentration:  Fair  Recall:  Fair  Akathisia:  No  Handed:  Right  AIMS (if indicated):     Assets:  Communication Skills Resilience  Sleep:  Number of Hours: 6.5   Current Medications: Current Facility-Administered Medications  Medication Dose Route Frequency Provider Last Rate Last Dose  . alum & mag hydroxide-simeth (MAALOX/MYLANTA) 200-200-20 MG/5ML suspension 30 mL  30 mL Oral Q4H PRN Kerry Hough, PA-C      . cloNIDine (CATAPRES) tablet 0.1 mg  0.1 mg Oral TID Kerry Hough, PA-C   0.1 mg at 11/19/12 1151  . dicyclomine (BENTYL) tablet 20 mg  20 mg Oral Q6H PRN Kerry Hough, PA-C      . hydrOXYzine (ATARAX/VISTARIL) tablet 25 mg  25 mg Oral Q6H PRN Kerry Hough, PA-C   25 mg at 11/19/12 0829  . loperamide (IMODIUM) capsule 2-4 mg  2-4 mg Oral PRN Kerry Hough, PA-C      . magnesium hydroxide (MILK OF MAGNESIA) suspension 30 mL  30 mL Oral Daily PRN Kerry Hough, PA-C      . methocarbamol (ROBAXIN) tablet 500 mg  500 mg Oral Q8H PRN Kerry Hough, PA-C   500 mg at 11/19/12 1549  . multivitamin with minerals tablet 1 tablet  1 tablet Oral Daily Jeoffrey Massed, RD      . naproxen (NAPROSYN) tablet 500 mg  500 mg Oral BID  PRN Kerry Hough, PA-C   500 mg at 11/19/12 1318  . ondansetron (ZOFRAN-ODT) disintegrating tablet 4 mg  4 mg Oral Q6H PRN Kerry Hough, PA-C      . oxyCODONE-acetaminophen (PERCOCET/ROXICET) 5-325 MG per tablet 1 tablet  1 tablet Oral BID PRN Nanine Means, NP      . prazosin (MINIPRESS) capsule 1 mg  1 mg Oral QHS Nanine Means, NP      . QUEtiapine (SEROQUEL) tablet 100 mg  100 mg Oral QHS,MR X 1 Nanine Means, NP      . sertraline (ZOLOFT) tablet 25 mg  25 mg Oral Daily Fransisca Kaufmann, NP   25 mg at 11/19/12 0750    Lab Results: No results found for this or any previous visit (from the past 48 hour(s)).  Physical Findings: AIMS: Facial and Oral  Movements Muscles of Facial Expression: None, normal Lips and Perioral Area: None, normal Jaw: None, normal Tongue: None, normal,Extremity Movements Upper (arms, wrists, hands, fingers): None, normal Lower (legs, knees, ankles, toes): None, normal, Trunk Movements Neck, shoulders, hips: None, normal, Overall Severity Severity of abnormal movements (highest score from questions above): None, normal Incapacitation due to abnormal movements: None, normal Patient's awareness of abnormal movements (rate only patient's report): No Awareness, Dental Status Current problems with teeth and/or dentures?: No Does patient usually wear dentures?: No  CIWA:  CIWA-Ar Total: 0 COWS:     Treatment Plan Summary: Daily contact with patient to assess and evaluate symptoms and progress in treatment Medication management  Plan:  Review of chart, vital signs, medications, and notes. 1-Individual and group therapy 2-Medication management for depression and anxiety:  Medications reviewed with the patient and his Seroquel at bedtime increased for sleep and his other bedtime medications moved to 8:30 per his request. 3-Coping skills for depression, anxiety, and pain 4-Continue crisis stabilization and management 5-Address health issues--monitoring vital signs, elevated blood pressures at times--clonidine TID in place, will continue to monitor 6-Treatment plan in progress to prevent relapse of depression and anxiety  Medical Decision Making Problem Points:  Established problem, stable/improving (1) and Review of psycho-social stressors (1) Data Points:  Review of new medications or change in dosage (2)  I certify that inpatient services furnished can reasonably be expected to improve the patient's condition.   Nanine Means, PMH-NP 11/19/2012, 4:12 PM

## 2012-11-20 MED ADMIN — Prazosin HCl Cap 1 MG: 1 mg | ORAL | NDC 51079063001

## 2012-11-20 MED ADMIN — Oxycodone w/ Acetaminophen Tab 5-325 MG: 1 | ORAL | NDC 00054865024

## 2012-11-20 MED ADMIN — Naproxen Tab 500 MG: 500 mg | ORAL | NDC 68084012711

## 2012-11-20 MED ADMIN — Naproxen Tab 500 MG: 500 mg | ORAL | NDC 53746019001

## 2012-11-20 MED ADMIN — Clonidine HCl Tab 0.1 MG: 0.1 mg | ORAL | NDC 00904565661

## 2012-11-20 MED ADMIN — Quetiapine Fumarate Tab 100 MG: 100 mg | ORAL | NDC 00904627961

## 2012-11-20 MED ADMIN — Sertraline HCl Tab 25 MG: 25 mg | ORAL | NDC 68084018011

## 2012-11-20 MED ADMIN — Hydroxyzine HCl Tab 25 MG: 25 mg | ORAL | NDC 63739048610

## 2012-11-20 MED ADMIN — Ondansetron Orally Disintegrating Tab 4 MG: 4 mg | ORAL | NDC 68462015713

## 2012-11-20 MED ADMIN — Methocarbamol Tab 500 MG: 500 mg | ORAL | NDC 63739016610

## 2012-11-20 MED FILL — Clonidine HCl Tab 0.1 MG: 0.1000 mg | ORAL | Qty: 1 | Status: AC

## 2012-11-20 MED FILL — Quetiapine Fumarate Tab 100 MG: 100.0000 mg | ORAL | Qty: 1 | Status: AC

## 2012-11-20 MED FILL — Quetiapine Fumarate Tab 100 MG: 100.0000 mg | ORAL | Qty: 1 | Status: CN

## 2012-11-20 MED FILL — Sertraline HCl Tab 50 MG: 50.0000 mg | ORAL | Qty: 1 | Status: AC

## 2012-11-20 MED FILL — Sertraline HCl Tab 50 MG: 50.0000 mg | ORAL | Qty: 1 | Status: CN

## 2012-11-21 MED ADMIN — Potassium Chloride Microencapsulated Crys ER Tab 10 mEq: 10 meq | ORAL | NDC 00245004189

## 2012-11-21 MED ADMIN — Prazosin HCl Cap 1 MG: 1 mg | ORAL | NDC 51079063001

## 2012-11-21 MED ADMIN — Oxycodone w/ Acetaminophen Tab 5-325 MG: 1 | ORAL | NDC 00054865024

## 2012-11-21 MED ADMIN — Lorazepam Tab 1 MG: 1 mg | ORAL | NDC 00904598161

## 2012-11-21 MED ADMIN — Naproxen Tab 500 MG: 500 mg | ORAL | NDC 68084012711

## 2012-11-21 MED ADMIN — Clonidine HCl Tab 0.1 MG: 0.1 mg | ORAL | NDC 00904565661

## 2012-11-21 MED ADMIN — Quetiapine Fumarate Tab 100 MG: 100 mg | ORAL | NDC 00904627961

## 2012-11-21 MED ADMIN — Sertraline HCl Tab 50 MG: 50 mg | ORAL | NDC 68084018111

## 2012-11-21 MED ADMIN — Hydroxyzine HCl Tab 25 MG: 25 mg | ORAL | NDC 63739048610

## 2012-11-21 MED ADMIN — Hydrochlorothiazide Tab 25 MG: 25 mg | ORAL | NDC 63739012810

## 2012-11-21 MED ADMIN — Methocarbamol Tab 500 MG: 500 mg | ORAL | NDC 63739016610

## 2012-11-21 MED FILL — Lorazepam Tab 1 MG: 1.0000 mg | ORAL | Qty: 1 | Status: AC

## 2012-11-21 MED FILL — Hydrochlorothiazide Tab 25 MG: 25.0000 mg | ORAL | Qty: 1 | Status: AC

## 2012-11-21 MED FILL — Potassium Chloride Microencapsulated Crys ER Tab 10 mEq: 10.0000 meq | ORAL | Qty: 1 | Status: AC

## 2012-11-21 MED FILL — Clonidine HCl Tab 0.1 MG: 0.1000 mg | ORAL | Qty: 1 | Status: AC

## 2012-11-22 MED ADMIN — Potassium Chloride Microencapsulated Crys ER Tab 10 mEq: 10 meq | ORAL | NDC 00245004189

## 2012-11-22 MED ADMIN — THERA M PLUS PO TABS: 1 | ORAL | NDC 00904549261

## 2012-11-22 MED ADMIN — Prazosin HCl Cap 1 MG: 1 mg | ORAL | NDC 51079063001

## 2012-11-22 MED ADMIN — Clonidine HCl Tab 0.2 MG: 0.2 mg | ORAL | NDC 62584033911

## 2012-11-22 MED ADMIN — Lorazepam Tab 1 MG: 1 mg | ORAL | NDC 00904598161

## 2012-11-22 MED ADMIN — Naproxen Tab 500 MG: 500 mg | ORAL | NDC 68084012711

## 2012-11-22 MED ADMIN — Clonidine HCl Tab 0.1 MG: 0.1 mg | ORAL | NDC 00904565661

## 2012-11-22 MED ADMIN — Sertraline HCl Tab 50 MG: 50 mg | ORAL | NDC 68084018111

## 2012-11-22 MED ADMIN — Hydroxyzine HCl Tab 25 MG: 25 mg | ORAL | NDC 63739048610

## 2012-11-22 MED ADMIN — Hydrochlorothiazide Tab 25 MG: 25 mg | ORAL | NDC 63739012810

## 2012-11-22 MED ADMIN — Quetiapine Fumarate Tab 25 MG: 150 mg | ORAL | NDC 68084053211

## 2012-11-22 MED ADMIN — Methocarbamol Tab 500 MG: 500 mg | ORAL | NDC 63739016610

## 2012-11-22 MED ADMIN — Quetiapine Fumarate Tab 100 MG: 100 mg | ORAL | NDC 00904627961

## 2012-11-22 MED FILL — Quetiapine Fumarate Tab 50 MG: 150.0000 mg | ORAL | Qty: 1 | Status: AC

## 2012-11-22 MED FILL — Clonidine HCl Tab 0.1 MG: 0.2000 mg | ORAL | Qty: 2 | Status: AC

## 2012-11-23 MED ADMIN — Potassium Chloride Microencapsulated Crys ER Tab 10 mEq: 10 meq | ORAL | NDC 00245004189

## 2012-11-23 MED ADMIN — THERA M PLUS PO TABS: 1 | ORAL | NDC 00904549213

## 2012-11-23 MED ADMIN — Prazosin HCl Cap 1 MG: 1 mg | ORAL | NDC 51079063001

## 2012-11-23 MED ADMIN — Clonidine HCl Tab 0.2 MG: 0.2 mg | ORAL | NDC 62584033911

## 2012-11-23 MED ADMIN — Clonidine HCl Tab 0.2 MG: 0.2 mg | ORAL | NDC 51079029901

## 2012-11-23 MED ADMIN — Methocarbamol Tab 500 MG: 500 mg | ORAL | NDC 63739016610

## 2012-11-23 MED ADMIN — Sertraline HCl Tab 50 MG: 50 mg | ORAL | NDC 68084018111

## 2012-11-23 MED ADMIN — Hydrochlorothiazide Tab 25 MG: 25 mg | ORAL | NDC 63739012810

## 2012-11-23 MED ADMIN — Lisinopril Tab 5 MG: 5 mg | ORAL | NDC 00904581161

## 2012-11-23 MED ADMIN — Quetiapine Fumarate Tab 25 MG: 150 mg | ORAL | NDC 00904627961

## 2012-11-23 MED ADMIN — Quetiapine Fumarate Tab 25 MG: 150 mg | ORAL | NDC 68084053111

## 2012-11-23 MED ADMIN — Hydroxyzine HCl Tab 25 MG: 25 mg | ORAL | NDC 63739048610

## 2012-11-23 MED ADMIN — Naproxen Tab 500 MG: 500 mg | ORAL | NDC 68084012711

## 2012-11-23 MED ADMIN — Quetiapine Fumarate Tab 100 MG: 100 mg | ORAL | NDC 00904627961

## 2012-11-23 MED FILL — Lisinopril Tab 5 MG: 5.0000 mg | ORAL | Qty: 1 | Status: AC

## 2012-11-23 MED FILL — Methocarbamol Tab 500 MG: 500.0000 mg | ORAL | Qty: 1 | Status: AC

## 2012-11-23 MED FILL — Sertraline HCl Tab 100 MG: 100.0000 mg | ORAL | Qty: 1 | Status: AC

## 2012-11-23 MED FILL — Naproxen Tab 500 MG: 500.0000 mg | ORAL | Qty: 1 | Status: AC

## 2012-11-23 MED FILL — Naproxen Tab 500 MG: 500.0000 mg | ORAL | Qty: 1 | Status: CN

## 2012-11-23 MED FILL — Quetiapine Fumarate Tab 100 MG: 100.0000 mg | ORAL | Qty: 1 | Status: AC

## 2012-11-23 NOTE — Progress Notes (Signed)
[facility] MD Progress Note  11/23/2012 9:10 AM Ronald Hampton  MRN:  [id number] Subjective:  Sleep is fair but still feels tired, hearing voices that are telling him to hurt himself and other people, starting to have ideations of hurting other people especially his dad--has not spoken to him in fourteen years until a recent funeral.  His mother is very supportive and is his "best friend."  Ronald Hampton is very focused on staying here at [facility].  Diagnosis:  Axis I: Anxiety Disorder NOS and Major Depression, Recurrent severe Axis II: Deferred Axis III: History reviewed. No pertinent past medical history. Axis IV: occupational problems, other psychosocial or environmental problems, problems related to social environment and problems with primary support group Axis V: 41-50 serious symptoms  ADL's:  Intact  Sleep: Fair  Appetite:  Fair  Suicidal Ideation:  Plan:  None Intent:  None Means:  None Homicidal Ideation:  Plan:  None Intent:  None Means:  None AEB (as evidenced by):  Psychiatric Specialty Exam: Review of Systems  Constitutional: Negative.   HENT: Negative.   Eyes: Negative.   Respiratory: Negative.   Cardiovascular: Negative.   Gastrointestinal: Negative.   Genitourinary: Negative.   Musculoskeletal: Negative.   Skin: Negative.   Neurological: Negative.   Endo/Heme/Allergies: Negative.   Psychiatric/Behavioral: Positive for depression and hallucinations. The patient is nervous/anxious.     Blood pressure 142/96, pulse 100, temperature 98.1 F (36.7 C), temperature source Oral, resp. rate 16, height 5\' 11"  (1.803 m).There is no weight on file to calculate BMI.  General Appearance: Disheveled  Eye Solicitor::  Fair  Speech:  Normal Rate  Volume:  Normal  Mood:  Anxious and Depressed  Affect:  Congruent  Thought Process:  Coherent  Orientation:  Full (Time, Place, and Person)  Thought Content:  WDL  Suicidal Thoughts:  Yes.  without intent/plan  Homicidal Thoughts:  Yes.  without  intent/plan  Memory:  Immediate;   Fair Recent;   Fair Remote;   Fair  Judgement:  Fair  Insight:  Fair  Psychomotor Activity:  Decreased  Concentration:  Fair  Recall:  Fair  Akathisia:  No  Handed:  Right  AIMS (if indicated):     Assets:  Communication Skills Resilience Social Support  Sleep:  Number of Hours: 6.75   Current Medications: Current Facility-Administered Medications  Medication Dose Route Frequency Provider Last Rate Last Dose  . alum & mag hydroxide-simeth (MAALOX/MYLANTA) 200-200-20 MG/5ML suspension 30 mL  30 mL Oral Q4H PRN Kerry Hough, PA-C      . cloNIDine (CATAPRES) tablet 0.2 mg  0.2 mg Oral TID Fransisca Kaufmann, NP   0.2 mg at 11/23/12 0826  . hydrochlorothiazide (HYDRODIURIL) tablet 25 mg  25 mg Oral Daily Nanine Means, NP   25 mg at 11/23/12 0826  . hydrOXYzine (ATARAX/VISTARIL) tablet 25 mg  25 mg Oral Q6H PRN Mike Craze, MD   25 mg at 11/23/12 0830  . lisinopril (PRINIVIL,ZESTRIL) tablet 5 mg  5 mg Oral Daily Nanine Means, NP      . magnesium hydroxide (MILK OF MAGNESIA) suspension 30 mL  30 mL Oral Daily PRN Kerry Hough, PA-C      . multivitamin with minerals tablet 1 tablet  1 tablet Oral Daily Jeoffrey Massed, RD   1 tablet at 11/23/12 0827  . naproxen (NAPROSYN) tablet 500 mg  500 mg Oral BID WC Nanine Means, NP      . potassium chloride (K-DUR,KLOR-CON) CR tablet 10 mEq  10 mEq Oral Daily Nanine Means, NP   10 mEq at 11/23/12 0827  . prazosin (MINIPRESS) capsule 1 mg  1 mg Oral QHS Nanine Means, NP   1 mg at 11/22/12 2019  . QUEtiapine (SEROQUEL) tablet 150 mg  150 mg Oral BID Fransisca Kaufmann, NP   150 mg at 11/23/12 1610  . sertraline (ZOLOFT) tablet 50 mg  50 mg Oral Daily Fransisca Kaufmann, NP   50 mg at 11/23/12 9604    Lab Results: No results found for this or any previous visit (from the past 48 hour(s)).  Physical Findings: AIMS: Facial and Oral Movements Muscles of Facial Expression: None, normal Lips and Perioral Area: None, normal Jaw:  None, normal Tongue: None, normal,Extremity Movements Upper (arms, wrists, hands, fingers): None, normal Lower (legs, knees, ankles, toes): None, normal, Trunk Movements Neck, shoulders, hips: None, normal, Overall Severity Severity of abnormal movements (highest score from questions above): None, normal Incapacitation due to abnormal movements: None, normal Patient's awareness of abnormal movements (rate only patient's report): No Awareness, Dental Status Current problems with teeth and/or dentures?: No Does patient usually wear dentures?: No  CIWA:  CIWA-Ar Total: 0 COWS:     Treatment Plan Summary: Daily contact with patient to assess and evaluate symptoms and progress in treatment Medication management  Plan:  Review of chart, vital signs, medications, and notes. 1-Individual and group therapy 2-Medication management for depression and anxiety:  Medications reviewed with the patient and he stated pain in his legs, naprosyn reordered.  Zoloft increased to assist with his anxiety and depression 3-Coping skills for depression, anxiety, and pain 4-Continue crisis stabilization and management 5-Address health issues--monitoring blood pressures, lisinopril added to his regiment  6-Treatment plan in progress to prevent relapse of depression and anxiety  Medical Decision Making Problem Points:  Established problem, stable/improving (1) and Review of psycho-social stressors (1) Data Points:  Review of new medications or change in dosage (2)  I certify that inpatient services furnished can reasonably be expected to improve the patient's condition.   Nanine Means, PMH-NP 11/23/2012, 9:10 AM

## 2012-11-24 MED ADMIN — Potassium Chloride Microencapsulated Crys ER Tab 10 mEq: 10 meq | ORAL | NDC 00245004189

## 2012-11-24 MED ADMIN — THERA M PLUS PO TABS: 1 | ORAL | NDC 00904549261

## 2012-11-24 MED ADMIN — Prazosin HCl Cap 1 MG: 1 mg | ORAL | NDC 51079063001

## 2012-11-24 MED ADMIN — Clonidine HCl Tab 0.2 MG: 0.2 mg | ORAL | NDC 62584033911

## 2012-11-24 MED ADMIN — Methocarbamol Tab 500 MG: 500 mg | ORAL | NDC 63739016610

## 2012-11-24 MED ADMIN — Hydrochlorothiazide Tab 25 MG: 25 mg | ORAL | NDC 68084008611

## 2012-11-24 MED ADMIN — Lisinopril Tab 5 MG: 5 mg | ORAL | NDC 00904581161

## 2012-11-24 MED ADMIN — Quetiapine Fumarate Tab 25 MG: 150 mg | ORAL | NDC 68084053111

## 2012-11-24 MED ADMIN — Quetiapine Fumarate Tab 25 MG: 150 mg | ORAL | NDC 00904627961

## 2012-11-24 MED ADMIN — Hydroxyzine HCl Tab 25 MG: 25 mg | ORAL | NDC 63739048610

## 2012-11-24 MED ADMIN — Sertraline HCl Tab 100 MG: 100 mg | ORAL | NDC 68084018211

## 2012-11-24 MED ADMIN — Naproxen Tab 500 MG: 500 mg | ORAL | NDC 00904559161

## 2012-11-24 MED ADMIN — Naproxen Tab 500 MG: 500 mg | ORAL | NDC 68084012711
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
## 2012-11-25 MED ADMIN — Potassium Chloride Microencapsulated Crys ER Tab 10 mEq: 10 meq | ORAL | NDC 00245004189

## 2012-11-25 MED ADMIN — THERA M PLUS PO TABS: 1 | ORAL | NDC 00904549261

## 2012-11-25 MED ADMIN — Prazosin HCl Cap 1 MG: 1 mg | ORAL | NDC 51079063001

## 2012-11-25 MED ADMIN — Clonidine HCl Tab 0.2 MG: 0.2 mg | ORAL | NDC 62584033911

## 2012-11-25 MED ADMIN — Methocarbamol Tab 500 MG: 500 mg | ORAL | NDC 63739016610

## 2012-11-25 MED ADMIN — Hydrochlorothiazide Tab 25 MG: 25 mg | ORAL | NDC 68084008611

## 2012-11-25 MED ADMIN — Lisinopril Tab 5 MG: 5 mg | ORAL | NDC 00904581161

## 2012-11-25 MED ADMIN — Quetiapine Fumarate Tab 25 MG: 150 mg | ORAL | NDC 68084053111

## 2012-11-25 MED ADMIN — Quetiapine Fumarate Tab 25 MG: 150 mg | ORAL | NDC 00904627861

## 2012-11-25 MED ADMIN — Hydroxyzine HCl Tab 25 MG: 25 mg | ORAL | NDC 63739048610

## 2012-11-25 MED ADMIN — Sertraline HCl Tab 100 MG: 100 mg | ORAL | NDC 68084018211

## 2012-11-25 MED ADMIN — Naproxen Tab 500 MG: 500 mg | ORAL | NDC 53746019001

## 2012-11-25 MED ADMIN — Naproxen Tab 500 MG: 500 mg | ORAL | NDC 68084012711
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
## 2012-11-26 MED ADMIN — Potassium Chloride Microencapsulated Crys ER Tab 10 mEq: 10 meq | ORAL | NDC 00245004189

## 2012-11-26 MED ADMIN — THERA M PLUS PO TABS: 1 | ORAL | NDC 00904549261

## 2012-11-26 MED ADMIN — Clonidine HCl Tab 0.2 MG: 0.2 mg | ORAL | NDC 62584033911

## 2012-11-26 MED ADMIN — Methocarbamol Tab 500 MG: 500 mg | ORAL | NDC 63739016610

## 2012-11-26 MED ADMIN — Hydrochlorothiazide Tab 25 MG: 25 mg | ORAL | NDC 68084008611

## 2012-11-26 MED ADMIN — Lisinopril Tab 5 MG: 5 mg | ORAL | NDC 00904581161

## 2012-11-26 MED ADMIN — Quetiapine Fumarate Tab 25 MG: 150 mg | ORAL | NDC 00904627861

## 2012-11-26 MED ADMIN — Quetiapine Fumarate Tab 25 MG: 150 mg | ORAL | NDC 00904627961

## 2012-11-26 MED ADMIN — Hydroxyzine HCl Tab 25 MG: 25 mg | ORAL | NDC 63739048610

## 2012-11-26 MED ADMIN — Sertraline HCl Tab 100 MG: 100 mg | ORAL | NDC 59762491003

## 2012-11-26 MED ADMIN — Naproxen Tab 500 MG: 500 mg | ORAL | NDC 68084012711

## 2012-11-26 MED ADMIN — Meloxicam Tab 7.5 MG: 7.5 mg | ORAL | NDC 51079045701

## 2012-11-26 MED ADMIN — Prazosin HCl Cap 2 MG: 2 mg | ORAL | NDC 51079063001

## 2012-11-26 MED FILL — Meloxicam Tab 7.5 MG: 7.5000 mg | ORAL | Qty: 1 | Status: AC

## 2012-11-26 MED FILL — Prazosin HCl Cap 1 MG: 2.0000 mg | ORAL | Qty: 2 | Status: AC

## 2012-11-26 MED FILL — Prazosin HCl Cap 2 MG: 2.0000 mg | ORAL | Qty: 1 | Status: AC
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
## 2012-11-27 ENCOUNTER — Other Ambulatory Visit: Payer: Self-pay

## 2012-11-27 ENCOUNTER — Emergency Department (HOSPITAL_COMMUNITY)
Admission: EM | Admit: 2012-11-27 | Discharge: 2012-11-28 | Disposition: A | Discharge status: Home / Self Care | Payer: Self-pay | Attending: Emergency Medicine | Admitting: Emergency Medicine

## 2012-11-27 ENCOUNTER — Encounter (HOSPITAL_COMMUNITY): Payer: Self-pay | Admitting: Emergency Medicine

## 2012-11-27 DIAGNOSIS — R079 Chest pain, unspecified: Secondary | ICD-10-CM | POA: Insufficient documentation

## 2012-11-27 DIAGNOSIS — I10 Essential (primary) hypertension: Secondary | ICD-10-CM | POA: Insufficient documentation

## 2012-11-27 DIAGNOSIS — F411 Generalized anxiety disorder: Secondary | ICD-10-CM | POA: Insufficient documentation

## 2012-11-27 DIAGNOSIS — F319 Bipolar disorder, unspecified: Secondary | ICD-10-CM | POA: Insufficient documentation

## 2012-11-27 DIAGNOSIS — R209 Unspecified disturbances of skin sensation: Secondary | ICD-10-CM | POA: Insufficient documentation

## 2012-11-27 DIAGNOSIS — F419 Anxiety disorder, unspecified: Secondary | ICD-10-CM

## 2012-11-27 DIAGNOSIS — Z79899 Other long term (current) drug therapy: Secondary | ICD-10-CM | POA: Insufficient documentation

## 2012-11-27 HISTORY — DX: Essential (primary) hypertension: I10

## 2012-11-27 HISTORY — DX: Bipolar disorder, unspecified: F31.9

## 2012-11-27 HISTORY — DX: Depression, unspecified: F32.A

## 2012-11-27 LAB — CBC WITH DIFFERENTIAL/PLATELET
Basophils Absolute: 0.1 10*3/uL (ref 0.0–0.1)
Basophils Relative: 1 % (ref 0–1)
Eosinophils Absolute: 0.2 10*3/uL (ref 0.0–0.7)
Eosinophils Relative: 1 % (ref 0–5)
HCT: 43.3 % (ref 39.0–52.0)
Hemoglobin: 15.5 g/dL (ref 13.0–17.0)
Lymphocytes Relative: 23 % (ref 12–46)
Lymphs Abs: 3.1 10*3/uL (ref 0.7–4.0)
MCH: 29.6 pg (ref 26.0–34.0)
MCHC: 35.8 g/dL (ref 30.0–36.0)
MCV: 82.6 fL (ref 78.0–100.0)
Monocytes Absolute: 1.2 10*3/uL — ABNORMAL HIGH (ref 0.1–1.0)
Monocytes Relative: 9 % (ref 3–12)
Neutro Abs: 9.1 10*3/uL — ABNORMAL HIGH (ref 1.7–7.7)
Neutrophils Relative %: 67 % (ref 43–77)
Platelets: 318 10*3/uL (ref 150–400)
RBC: 5.24 MIL/uL (ref 4.22–5.81)
RDW: 14.9 % (ref 11.5–15.5)
WBC: 13.6 10*3/uL — ABNORMAL HIGH (ref 4.0–10.5)

## 2012-11-27 LAB — BASIC METABOLIC PANEL
BUN: 13 mg/dL (ref 6–23)
CO2: 27 meq/L (ref 19–32)
Calcium: 10.2 mg/dL (ref 8.4–10.5)
Chloride: 97 meq/L (ref 96–112)
Creatinine, Ser: 0.94 mg/dL (ref 0.50–1.35)
GFR calc Af Amer: >90 mL/min (ref >90)
GFR calc non Af Amer: >90 mL/min (ref >90)
Glucose, Bld: 159 mg/dL — ABNORMAL HIGH (ref 70–99)
Potassium: 5.1 meq/L (ref 3.5–5.1)
Sodium: 135 meq/L (ref 135–145)

## 2012-11-27 LAB — ETHANOL: Alcohol, Ethyl (B): <11 mg/dL (ref 0–11)

## 2012-11-27 LAB — RAPID URINE DRUG SCREEN, HOSP PERFORMED
Amphetamines: NOT DETECTED
Barbiturates: NOT DETECTED
Benzodiazepines: NOT DETECTED
Cocaine: NOT DETECTED
Opiates: POSITIVE — AB
Tetrahydrocannabinol: NOT DETECTED

## 2012-11-27 LAB — POCT I-STAT TROPONIN I: Troponin i, poc: 0 ng/mL (ref 0.00–0.08)

## 2012-11-27 MED ADMIN — Potassium Chloride Microencapsulated Crys ER Tab 10 mEq: 10 meq | ORAL | NDC 00245004189

## 2012-11-27 MED ADMIN — THERA M PLUS PO TABS: 1 | ORAL | NDC 00904549261

## 2012-11-27 MED ADMIN — Clonidine HCl Tab 0.2 MG: 0.2 mg | ORAL | NDC 62584033911

## 2012-11-27 MED ADMIN — Methocarbamol Tab 500 MG: 500 mg | ORAL | NDC 63739016610

## 2012-11-27 MED ADMIN — Hydrochlorothiazide Tab 25 MG: 25 mg | ORAL | NDC 68084008611

## 2012-11-27 MED ADMIN — Lisinopril Tab 5 MG: 5 mg | ORAL | NDC 00904581161

## 2012-11-27 MED ADMIN — Quetiapine Fumarate Tab 25 MG: 150 mg | ORAL | NDC 00904627961

## 2012-11-27 MED ADMIN — Hydroxyzine HCl Tab 25 MG: 25 mg | ORAL | NDC 63739048610

## 2012-11-27 MED ADMIN — Sertraline HCl Tab 100 MG: 100 mg | ORAL | NDC 68084018211

## 2012-11-27 MED ADMIN — Lorazepam Tab 1 MG: 1 mg | ORAL | NDC 00904598161

## 2012-11-27 MED ADMIN — Meloxicam Tab 7.5 MG: 7.5 mg | ORAL | NDC 51079045701

## 2012-11-27 MED FILL — Lorazepam Tab 1 MG: 1.0000 mg | ORAL | Qty: 1 | Status: AC

## 2012-11-27 NOTE — Discharge Instructions (Signed)
Depression  Depression is a strong emotion of feeling unhappy that can last for weeks, months, or even longer. Depression causes problems with the ability to function in life. It upsets your:   Relationships.  Sleep.  Eating habits.  Work habits. HOME CARE  Take all medicine as told by your doctor.  Talk with a therapist, counselor, or friend.  Eat a healthy diet.  Exercise regularly.  Do not drink alcohol or use drugs. GET HELP RIGHT AWAY IF: You start to have thoughts about hurting yourself or others. MAKE SURE YOU:  Understand these instructions.  Will watch your condition.  Will get help right away if you are not doing well or get worse. Document Released: 08/04/2010 Document Revised: 09/24/2011 Document Reviewed: 08/04/2010 [facility] Patient Information 2013 [place], [place].  Manic Depression (Bipolar Disorder) Bipolar disorder is also known as manic depressive illness. It is when the brain does not function properly and causes shifts in a person's moods, energy and ability to function in everyday life. These shifts are different from the normal ups and downs that everyone experiences. Instead the shifts are severe. If this goes untreated, the person's life becomes more and more disorderly. People with this disorder can be treated can lead full and productive lives. This disorder must be managed throughout life.  SYMPTOMS   Bipolar disorder causes dramatic mood swings. These mood swings go in cycles. They cycle from extreme "highs" and irritable to deep "lows" of sadness and hopelessness.  Between the extreme moods, there are usually periods of normal mood.  Along with the mood shifts, the person will have severe changes in energy and behavior. The periods of "highs" and "lows" are called episodes of mania and depression. Signs of mania:  Lots of energy, activity and restlessness.  Extreme "high" or good mood.  Extreme irritability.  Racing thoughts and talking  very fast.  Jumping from one idea to another.  Not able to focus, easily distracted.  Little need to sleep.  Grand beliefs in one's abilities and powers.  Spending sprees.  Increased sexual drive. This can result in many sexual partners.  Poor judgment.  Abuse of drugs, particularly cocaine, alcohol, and sleeping medication.  Aggressive or provocative behavior.  A lasting period of behavior that is different from usual.  Denial that anything is wrong. *A manic episode is identified if a "high" mood happens with three or more of the other symptoms lasting most of the day, nearly everyday for a week or longer. If the mood is more irritable in nature, four additional symptoms must be present. Signs of depression:  Lasting feelings of sadness, anxiety, or empty mood.  Feelings of hopelessness with negative thoughts.  Feelings of guilt, worthlessness, or helplessness.  Loss of interest or pleasure in activities once enjoyed, including sex.  Feelings of fatigue or having less energy.  Trouble focusing, making decisions, remembering.  Feeling restless or irritable.  Sleeping too little or too much.  Change in eating with possible weight gain or loss.  Feeling ongoing pain that is not caused by physical illness or injury.  Thoughts of death or suicide or suicide attempts. *A depressive episode is identified as having five or more of the above symptoms that last most of the day, nearly everyday for two weeks or longer. CAUSES   Research shows that there is no single cause for the disorder. Many factors act together to produce the illness.  This can be passed down from family (hereditary).  Environment may play a  part. TREATMENT   Long-term treatment is strongly recommended because bipolar disorder is a repeated illness. This disorder is better controlled if treatment is ongoing than if it is off and on.  A combination of medication and talk therapy is best for  managing the disorder over time.  Medication.  Medication can be prescribed by a doctor that is an expert in treating mental disorders (psychiatrists). Medications known as "mood stabilizers" are usually prescribed to help control the illness. Other medications can be added when needed. These medicines usually treat episodes of mania or depression that break through despite the mood stabilizer.  Talk Therapy.  Along with medication, some forms of talk therapy are helpful in providing support, education and guidance to people with the illness and their families. Studies show that this type of treatment increases mood stability, decreases need for hospitalization and improves how they function society.  Electroconvulsive Therapy (ECT).  In extreme situations where the above treatments do not work or work too slowly to relieve severe symptoms, ECT may be considered. Document Released: 10/08/2000 Document Revised: 09/24/2011 Document Reviewed: 05/30/2007 [facility] Patient Information 2013 [place], [place].
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
## 2012-11-28 ENCOUNTER — Other Ambulatory Visit: Payer: Self-pay

## 2012-11-28 ENCOUNTER — Encounter (HOSPITAL_COMMUNITY): Payer: Self-pay | Admitting: Emergency Medicine

## 2012-11-28 ENCOUNTER — Emergency Department (HOSPITAL_COMMUNITY): Payer: Self-pay

## 2012-11-28 ENCOUNTER — Observation Stay (HOSPITAL_COMMUNITY)
Admission: EM | Admit: 2012-11-28 | Discharge: 2012-11-28 | Discharge status: Against Medical Advice | Payer: Self-pay | Attending: Internal Medicine | Admitting: Internal Medicine

## 2012-11-28 DIAGNOSIS — G8929 Other chronic pain: Secondary | ICD-10-CM

## 2012-11-28 DIAGNOSIS — R7989 Other specified abnormal findings of blood chemistry: Secondary | ICD-10-CM | POA: Insufficient documentation

## 2012-11-28 DIAGNOSIS — M79609 Pain in unspecified limb: Secondary | ICD-10-CM | POA: Insufficient documentation

## 2012-11-28 DIAGNOSIS — F411 Generalized anxiety disorder: Secondary | ICD-10-CM | POA: Insufficient documentation

## 2012-11-28 DIAGNOSIS — F333 Major depressive disorder, recurrent, severe with psychotic symptoms: Secondary | ICD-10-CM

## 2012-11-28 DIAGNOSIS — I10 Essential (primary) hypertension: Secondary | ICD-10-CM | POA: Insufficient documentation

## 2012-11-28 DIAGNOSIS — R55 Syncope and collapse: Secondary | ICD-10-CM | POA: Insufficient documentation

## 2012-11-28 DIAGNOSIS — F319 Bipolar disorder, unspecified: Secondary | ICD-10-CM | POA: Insufficient documentation

## 2012-11-28 DIAGNOSIS — R4182 Altered mental status, unspecified: Principal | ICD-10-CM

## 2012-11-28 HISTORY — DX: Other specified abnormal findings of blood chemistry: R79.89

## 2012-11-28 HISTORY — DX: Other chronic pain: G89.29

## 2012-11-28 LAB — COMPREHENSIVE METABOLIC PANEL
ALT: 53 U/L (ref 0–53)
AST: 21 U/L (ref 0–37)
Albumin: 4.6 g/dL (ref 3.5–5.2)
Alkaline Phosphatase: 84 U/L (ref 39–117)
BUN: 17 mg/dL (ref 6–23)
CO2: 29 meq/L (ref 19–32)
Calcium: 10 mg/dL (ref 8.4–10.5)
Chloride: 97 meq/L (ref 96–112)
Creatinine, Ser: 0.82 mg/dL (ref 0.50–1.35)
GFR calc Af Amer: >90 mL/min (ref >90)
GFR calc non Af Amer: >90 mL/min (ref >90)
Glucose, Bld: 107 mg/dL — ABNORMAL HIGH (ref 70–99)
Potassium: 4.1 meq/L (ref 3.5–5.1)
Sodium: 136 meq/L (ref 135–145)
Total Bilirubin: 0.8 mg/dL (ref 0.3–1.2)
Total Protein: 7.9 g/dL (ref 6.0–8.3)

## 2012-11-28 LAB — RAPID URINE DRUG SCREEN, HOSP PERFORMED
Amphetamines: NOT DETECTED
Barbiturates: NOT DETECTED
Benzodiazepines: NOT DETECTED
Cocaine: NOT DETECTED
Opiates: POSITIVE — AB
Tetrahydrocannabinol: NOT DETECTED

## 2012-11-28 LAB — CBC WITH DIFFERENTIAL/PLATELET
Basophils Absolute: 0.1 10*3/uL (ref 0.0–0.1)
Basophils Relative: 1 % (ref 0–1)
Eosinophils Absolute: 0.2 10*3/uL (ref 0.0–0.7)
Eosinophils Relative: 2 % (ref 0–5)
HCT: 42.7 % (ref 39.0–52.0)
Hemoglobin: 15.7 g/dL (ref 13.0–17.0)
Lymphocytes Relative: 25 % (ref 12–46)
Lymphs Abs: 3 10*3/uL (ref 0.7–4.0)
MCH: 30.3 pg (ref 26.0–34.0)
MCHC: 36.8 g/dL — ABNORMAL HIGH (ref 30.0–36.0)
MCV: 82.4 fL (ref 78.0–100.0)
Monocytes Absolute: 1 10*3/uL (ref 0.1–1.0)
Monocytes Relative: 8 % (ref 3–12)
Neutro Abs: 7.6 10*3/uL (ref 1.7–7.7)
Neutrophils Relative %: 64 % (ref 43–77)
Platelets: 300 10*3/uL (ref 150–400)
RBC: 5.18 MIL/uL (ref 4.22–5.81)
RDW: 14.8 % (ref 11.5–15.5)
WBC: 11.9 10*3/uL — ABNORMAL HIGH (ref 4.0–10.5)

## 2012-11-28 LAB — SALICYLATE LEVEL: Salicylate Lvl: <2 mg/dL — ABNORMAL LOW (ref 2.8–20.0)

## 2012-11-28 LAB — URINALYSIS, ROUTINE W REFLEX MICROSCOPIC
Bilirubin Urine: NEGATIVE
Glucose, UA: NEGATIVE mg/dL
Hgb urine dipstick: NEGATIVE
Ketones, ur: NEGATIVE mg/dL
Leukocytes, UA: NEGATIVE
Nitrite: NEGATIVE
Protein, ur: NEGATIVE mg/dL
Specific Gravity, Urine: 1.021 (ref 1.005–1.030)
Urobilinogen, UA: 0.2 mg/dL (ref 0.0–1.0)
pH: 5 (ref 5.0–8.0)

## 2012-11-28 LAB — AMMONIA: Ammonia: 37 umol/L (ref 11–60)

## 2012-11-28 LAB — ACETAMINOPHEN LEVEL: Acetaminophen (Tylenol), Serum: <15 ug/mL (ref 10–30)

## 2012-11-28 LAB — ETHANOL: Alcohol, Ethyl (B): <11 mg/dL (ref 0–11)

## 2012-11-28 MED ADMIN — Acetaminophen Tab 325 MG: 650 mg | ORAL | NDC 00904198261

## 2012-11-28 MED ADMIN — Ibuprofen Tab 200 MG: 400 mg | ORAL | NDC 00904791461

## 2012-11-28 MED FILL — Ketorolac Tromethamine IM Inj 60 MG/2ML (30 MG/ML): 60.0000 mg | INTRAMUSCULAR | Qty: 2 | Status: AC

## 2012-11-28 MED FILL — Acetaminophen Tab 325 MG: 650.0000 mg | ORAL | Qty: 2 | Status: AC

## 2012-11-28 MED FILL — Ibuprofen Tab 200 MG: 400.0000 mg | ORAL | Qty: 2 | Status: AC

## 2012-11-28 NOTE — Discharge Instructions (Signed)
Return here as needed. Follow up with your doctor and your psychiatrist. Your tests here tonight were normal

## 2012-11-28 NOTE — Discharge Instructions (Signed)
RESOURCE GUIDE  Chronic Pain Problems: Contact [facility]  [phone] Patients need to be referred by their primary care doctor.  Insufficient Money for Medicine: Contact United Way:  call "211."   No Primary Care Doctor: - Call Health Connect  [phone] - can help you locate a primary care doctor that  accepts your insurance, provides certain services, etc. - Physician Referral Service- [phone]  Agencies that provide inexpensive medical care: - [facility]  [phone] - [facility]  [phone] - [facility]  [phone] - Women's Clinic  [phone] - Planned Parenthood  [phone] [facility]  [phone]  Medicaid-accepting [facility] Acute Care Providers: - [facility]- [address]  [phone], Mon-Fri 9am-7pm, Sat 9am-1pm - [facility]- [address] [place], [address] [place]  [phone] - [facility]- [address], Suite [place]  [phone] [facility]- [address]  [phone] - Renaye Rakers- [address] [place], [address], [phone]  Only accepts [place] patients after they have their name  applied to their card  Self Pay (no insurance) in [place]: - Sickle Cell Patients - [facility]  [address] [place], [phone] - [facility]- [address] [place]  [phone]       [facility] [place]- [address]       -     [facility]- see information above (Speak to [employer] if you do not have insurance)       -  [facility]- [address],  [phone]       -  [facility]- [address] [phone]       -  Dr Julio Sicks-  [address], [place], [phone]       -  [facility] and [facility] - [address], [phone]       -  [facility]- [address] [phone], also [address] [phone]       -     [facility]- [address] [place], [phone], 1st & 3rd Saturday         every month, 10am-1pm  -     [facility]   [address] [place], [place].   Phone:  [phone], Fax:  [phone]. Hours of Operation:  9 am - 6 pm, M-F.  -     [facility]   [address], [place]   Phone: [phone], Fax: [phone]. Hours of Operation:  8:30 am - 5:30 pm, M-F.  [facility] [address] [place], [place] [address] [phone]  [facility] [address] [place], [place] [address] [phone]  1) Find a Doctor and Pay Out of Pocket Although you won't have to find out who is covered by your insurance plan, it is a good idea to ask around and get recommendations. You will then need to call the office and see if the doctor you have chosen will accept you as a new patient and what types of options they offer for patients who are self-pay. Some doctors offer discounts or will set up payment plans for their patients who do not have insurance, but  you will need to ask so you aren't surprised when you get to your appointment.  2) Contact Your Local Health Department Not all health departments have doctors that can see patients for sick visits, but many do, so it is worth a call to see if yours does. If you don't know where your local health department is, you can check in your phone book. The CDC also has a tool to help you locate your state's health department, and many state websites also have listings of all of their local health departments.  3) Find a Walk-in Clinic If your illness is not likely to be very severe or complicated, you may want to try a walk in clinic. These are popping up all over the country in pharmacies, drugstores, and shopping centers. They're usually staffed by nurse practitioners or physician assistants that have been trained to  treat common illnesses and complaints. They're usually fairly quick and inexpensive. However, if you have serious medical issues or chronic medical problems, these are probably not your best option  STD Testing - [facility] Department of [facility] [place], STD Clinic, [address], [place], phone [phone] or [phone].  Monday - Friday, call for an appointment. [facility] Department of [employer], STD Clinic, [place] [address] [place], phone [phone] or [phone].  Monday - Friday, call for an appointment.  Abuse/Neglect: [facility] Child Abuse Hotline (470)328-1303 [facility] Child Abuse Hotline 6208315591 (After Hours)  Emergency Shelter:  [facility] [phone]  Maternity Homes: - Room at the [place] [facility] [phone] - [facility] [phone]  MRSA Hotline #:   769-662-6284  Dental Assistance If unable to pay or uninsured, contact:  [facility]. to become qualified for the adult dental clinic.  Patients with Medicaid: [facility] [phone] [address] Qu[address], [phone] [address], [phone]  If unable to pay, or uninsured, contact [facility] [phone] in [place], [phone] in [facility]) to become qualified for the adult dental clinic  [facility] [address] [place], [place] [address] [phone] [email or web address]  Other Proofreader Services: - [facility]- [address] [place], [place], [place], [address], [phone], Ext. 123, 2nd and 4th Thursday of the month at 6:30am.  10 clients each day by appointment, can sometimes see walk-in patients if someone does not show for an appointment. [facility]- [address] Ether Griffins [place], [place], [address], [phone] - [facility] [address] [place], [place], [address], [phone] - [place]  Health Department- [phone] [facility] Health Department- [phone] [facility] Health [phone]       Behavioral Health Resources in the [facility]  Intensive Outpatient Programs: [facility]      [address] [place], [place] [phone] Both a day and evening program       [facility] Outpatient     [address]        [place], [place] [address] [phone]         ADS: [facility] [address] [place] [phone]  [facility] Mental Health ACCESS LINE: 3655802597 or [phone] [address] [place], [place] [address] [email or web address]   Substance Abuse Resources: - [facility] Drug Services  [phone] - [facility] [phone] - [facility] [place] [phone] [facility] [phone] -  Residential & Outpatient Substance Abuse Program  [phone]  Psychological Services: [facility]  8596305166 Services  [phone]) [phone] - [facility], [phone] [place]. [address] [place], ACCESS LINE: (907)214-8827 or (478) [phone], [email or web address]  Mobile Crisis Teams:                                        Therapeutic Alternatives         Mobile Crisis Care Unit [phone]) [phone]             [facility] [address] Ginette Otto [phone]                                         Interventionist [address] [place] [address] [place] [phone]  Self-Help/Support Groups: Mental Health Assoc. of [employer] of support groups 435-236-4776 (call for more info)  Narcotics Anonymous (NA) [facility] [address] [place] - 2 meetings at this location  Residential Treatment Programs:  [facility]      [address]        [place]       [phone]         [facility] [address], [place] [address] [place], [place]  [address] [phone]  [facility]  [address] [place], [place] [address] [phone] Admissions: 8am-3pm M-F  [facility]     [address]        [place], [place] [address]       [phone]         [facility] [address] [place], [place] [phone]  [facility] [address] [place], [place] [phone]  [facility] - Intensive Outpatient      [address]     [place], [place]       [phone]         [facility] ([facility]     [address] [place], [place] [phone] or [phone]  Residential Treatment Services (RTS), Medicaid [address] [place], [place] [phone]  [facility] [address]                                               [address] [place] [phone]  [facility] Community Resources: [phone]               General Therapy                                                Angie Fava, PhD        [address] [place], [place] [address]         [phone]   Insurance  [facility]  Behavioral   [address] [place], [place] [address] [phone]  [facility] Recovery [address] [place], [place] [address] [phone] Insurance/Medicaid/sponsorship through [facility] and Families                                              [address]                                        [place], [place] [address]    Therapy/tele-psych/case         [phone]          [facility] [address], [place]  [address]  Adolescent/group home/case management [phone]                                           Creola Corn PhD       General therapy       Insurance   [phone]         Dr. Lolly Mustache, [place], M-F [phone]  [facility]  [employer] [facility] Dept. [address]                 [address]         [address] [place] [address]  Blondell Reveal Phone:  [phone]                                  Phone:  [phone]                   Phone:  [phone]  [facility], [phone] - [facility] - [facility]- [phone]       -     [facility] in [place], [address]             [phone], Insurance  [facility] Child Abuse Hotline 250-300-6637 or [phone])[phone] (After Hours)

## 2012-11-29 LAB — URINE CULTURE
Colony Count: NO GROWTH
Culture: NO GROWTH

## 2012-12-08 ENCOUNTER — Inpatient Hospital Stay (HOSPITAL_COMMUNITY)
Admission: RE | Admit: 2012-12-08 | Discharge: 2012-12-22 | DRG: 885 | Disposition: A | Discharge status: Home / Self Care | Payer: No Typology Code available for payment source | Attending: Psychiatry | Admitting: Psychiatry

## 2012-12-08 DIAGNOSIS — F3130 Bipolar disorder, current episode depressed, mild or moderate severity, unspecified: Secondary | ICD-10-CM

## 2012-12-08 DIAGNOSIS — R45851 Suicidal ideations: Secondary | ICD-10-CM

## 2012-12-08 DIAGNOSIS — Z79899 Other long term (current) drug therapy: Secondary | ICD-10-CM

## 2012-12-08 DIAGNOSIS — F333 Major depressive disorder, recurrent, severe with psychotic symptoms: Principal | ICD-10-CM | POA: Diagnosis present

## 2012-12-08 DIAGNOSIS — I10 Essential (primary) hypertension: Secondary | ICD-10-CM | POA: Diagnosis present

## 2012-12-08 DIAGNOSIS — F319 Bipolar disorder, unspecified: Secondary | ICD-10-CM

## 2012-12-08 LAB — RAPID URINE DRUG SCREEN, HOSP PERFORMED
Amphetamines: NOT DETECTED
Barbiturates: NOT DETECTED
Benzodiazepines: NOT DETECTED
Cocaine: NOT DETECTED
Opiates: POSITIVE — AB
Tetrahydrocannabinol: NOT DETECTED

## 2012-12-08 LAB — ETHANOL: Alcohol, Ethyl (B): <11 mg/dL (ref 0–11)

## 2012-12-08 MED ADMIN — Clonidine HCl Tab 0.2 MG: 0.2 mg | ORAL | NDC 00904565661

## 2012-12-08 MED ADMIN — Acetaminophen Tab 325 MG: 650 mg | ORAL | NDC 00904198261

## 2012-12-08 MED ADMIN — Quetiapine Fumarate Tab 100 MG: 100 mg | ORAL | NDC 00904627961

## 2012-12-08 MED ADMIN — Ketorolac Tromethamine Inj 30 MG/ML: 30 mg | INTRAMUSCULAR | NDC 00409379549

## 2012-12-08 MED FILL — Ketorolac Tromethamine Inj 30 MG/ML: 30.0000 mg | INTRAMUSCULAR | Qty: 1 | Status: AC

## 2012-12-08 MED FILL — Clonidine HCl Tab 0.2 MG: 0.2000 mg | ORAL | Qty: 1 | Status: AC

## 2012-12-08 MED FILL — Clonidine HCl Tab 0.2 MG: 0.2000 mg | ORAL | Qty: 1 | Status: CN

## 2012-12-08 MED FILL — Clonidine HCl Tab 0.1 MG: 0.2000 mg | ORAL | Qty: 2 | Status: AC

## 2012-12-08 MED FILL — Quetiapine Fumarate Tab 100 MG: 100.0000 mg | ORAL | Qty: 1 | Status: AC

## 2012-12-08 MED FILL — Sertraline HCl Tab 100 MG: 100.0000 mg | ORAL | Qty: 1 | Status: AC

## 2012-12-08 MED FILL — Sertraline HCl Tab 100 MG: 100.0000 mg | ORAL | Qty: 1 | Status: CN

## 2012-12-09 DIAGNOSIS — F4310 Post-traumatic stress disorder, unspecified: Secondary | ICD-10-CM

## 2012-12-09 MED ADMIN — Clonidine HCl Tab 0.2 MG: 0.2 mg | ORAL | NDC 62584033911

## 2012-12-09 MED ADMIN — Hydroxyzine HCl Tab 25 MG: 25 mg | ORAL | NDC 63739048610

## 2012-12-09 MED ADMIN — Lisinopril Tab 5 MG: 5 mg | ORAL | NDC 00904581161

## 2012-12-09 MED ADMIN — Hydrochlorothiazide Tab 25 MG: 25 mg | ORAL | NDC 68084008611

## 2012-12-09 MED ADMIN — Acetaminophen Tab 325 MG: 650 mg | ORAL | NDC 00904198261

## 2012-12-09 MED ADMIN — Quetiapine Fumarate Tab 100 MG: 100 mg | ORAL | NDC 00904627961

## 2012-12-09 MED ADMIN — Pseudoephedrine HCl Tab 30 MG: 30 mg | ORAL | NDC 00904505360

## 2012-12-09 MED ADMIN — Sertraline HCl Tab 100 MG: 100 mg | ORAL | NDC 68084018211

## 2012-12-09 MED ADMIN — Prazosin HCl Cap 2 MG: 2 mg | ORAL | NDC 51079063001

## 2012-12-09 MED ADMIN — Meloxicam Tab 7.5 MG: 7.5 mg | ORAL | NDC 61442012601

## 2012-12-09 MED FILL — Meloxicam Tab 7.5 MG: 7.5000 mg | ORAL | Qty: 1 | Status: AC

## 2012-12-09 MED FILL — Meloxicam Tab 7.5 MG: 7.5000 mg | ORAL | Qty: 1 | Status: CN

## 2012-12-09 MED FILL — Pseudoephedrine HCl Tab 30 MG: 30.0000 mg | ORAL | Qty: 1 | Status: AC

## 2012-12-09 MED FILL — Quetiapine Fumarate Tab 100 MG: 100.0000 mg | ORAL | Qty: 1 | Status: AC

## 2012-12-09 MED FILL — Quetiapine Fumarate Tab 100 MG: 100.0000 mg | ORAL | Qty: 1 | Status: CN

## 2012-12-09 MED FILL — Lisinopril Tab 5 MG: 5.0000 mg | ORAL | Qty: 1 | Status: CN

## 2012-12-09 MED FILL — Lisinopril Tab 5 MG: 5.0000 mg | ORAL | Qty: 1 | Status: AC

## 2012-12-09 MED FILL — Hydrochlorothiazide Tab 25 MG: 25.0000 mg | ORAL | Qty: 1 | Status: AC

## 2012-12-09 MED FILL — Hydrochlorothiazide Tab 25 MG: 25.0000 mg | ORAL | Qty: 1 | Status: CN

## 2012-12-09 MED FILL — Prazosin HCl Cap 1 MG: 2.0000 mg | ORAL | Qty: 2 | Status: AC

## 2012-12-09 MED FILL — Prazosin HCl Cap 2 MG: 2.0000 mg | ORAL | Qty: 1 | Status: AC

## 2012-12-09 MED FILL — Prazosin HCl Cap 2 MG: 2.0000 mg | ORAL | Qty: 1 | Status: CN

## 2012-12-09 MED FILL — Prazosin HCl Cap 1 MG: 2.0000 mg | ORAL | Qty: 28 | Status: AC

## 2012-12-09 MED FILL — Prazosin HCl Cap 1 MG: 2.0000 mg | ORAL | Qty: 28 | Status: CN
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
## 2012-12-09 NOTE — Progress Notes (Signed)
Grief and Loss Group   Group members discussed significant losses in their lives. Members processed feelings associated with grief and loss, and the various coping strategies to deal with these losses.   Pt was present during group and participated in group. Pt described feelings of isolation due to a sense of abandonment by his friends attributed to the intensity of his mental health issues. Pt was supportive to the other group members.   Sherol Dade  Counselor Intern  Haroldine Laws

## 2012-12-10 DIAGNOSIS — F332 Major depressive disorder, recurrent severe without psychotic features: Secondary | ICD-10-CM

## 2012-12-10 MED ADMIN — Clonidine HCl Tab 0.2 MG: 0.2 mg | ORAL | NDC 62584033911

## 2012-12-10 MED ADMIN — Clonidine HCl Tab 0.2 MG: 0.2 mg | ORAL | NDC 00904565661

## 2012-12-10 MED ADMIN — Hydroxyzine HCl Tab 25 MG: 25 mg | ORAL | NDC 63739048610

## 2012-12-10 MED ADMIN — Lisinopril Tab 5 MG: 5 mg | ORAL | NDC 00904581161

## 2012-12-10 MED ADMIN — Hydrochlorothiazide Tab 25 MG: 25 mg | ORAL | NDC 68084008611

## 2012-12-10 MED ADMIN — Acetaminophen Tab 325 MG: 650 mg | ORAL | NDC 00904198261

## 2012-12-10 MED ADMIN — Quetiapine Fumarate Tab 100 MG: 100 mg | ORAL | NDC 00904627961

## 2012-12-10 MED ADMIN — Quetiapine Fumarate Tab 100 MG: 100 mg | ORAL | NDC 00904627861

## 2012-12-10 MED ADMIN — Sertraline HCl Tab 100 MG: 100 mg | ORAL | NDC 68084018211

## 2012-12-10 MED ADMIN — Fluoxetine HCl Cap 10 MG: 10 mg | ORAL | NDC 65862019201

## 2012-12-10 MED ADMIN — Prazosin HCl Cap 2 MG: 2 mg | ORAL | NDC 51079063001

## 2012-12-10 MED ADMIN — Meloxicam Tab 7.5 MG: 7.5 mg | ORAL | NDC 61442012601

## 2012-12-10 MED FILL — Fluoxetine HCl Cap 10 MG: 10.0000 mg | ORAL | Qty: 1 | Status: CN

## 2012-12-10 MED FILL — Fluoxetine HCl Cap 10 MG: 10.0000 mg | ORAL | Qty: 1 | Status: AC

## 2012-12-10 MED FILL — Sertraline HCl Tab 50 MG: 50.0000 mg | ORAL | Qty: 1 | Status: AC

## 2012-12-10 MED FILL — Sertraline HCl Tab 50 MG: 50.0000 mg | ORAL | Qty: 1 | Status: CN

## 2012-12-11 MED ADMIN — Clonidine HCl Tab 0.2 MG: 0.2 mg | ORAL | NDC 62584033911

## 2012-12-11 MED ADMIN — Lisinopril Tab 5 MG: 5 mg | ORAL | NDC 00904581161

## 2012-12-11 MED ADMIN — Hydrochlorothiazide Tab 25 MG: 25 mg | ORAL | NDC 68084008611

## 2012-12-11 MED ADMIN — Acetaminophen Tab 325 MG: 650 mg | ORAL | NDC 00904198261

## 2012-12-11 MED ADMIN — Quetiapine Fumarate Tab 100 MG: 100 mg | ORAL | NDC 00904627961

## 2012-12-11 MED ADMIN — Pseudoephedrine HCl Tab 30 MG: 30 mg | ORAL | NDC 00904505360

## 2012-12-11 MED ADMIN — Fluoxetine HCl Cap 10 MG: 10 mg | ORAL | NDC 65862019201

## 2012-12-11 MED ADMIN — Sertraline HCl Tab 50 MG: 50 mg | ORAL | NDC 68084018111

## 2012-12-11 MED ADMIN — Mirtazapine Tab 15 MG: 15 mg | ORAL | NDC 63739035510

## 2012-12-11 MED ADMIN — Prazosin HCl Cap 2 MG: 2 mg | ORAL | NDC 51079063001

## 2012-12-11 MED ADMIN — Meloxicam Tab 7.5 MG: 7.5 mg | ORAL | NDC 61442012601

## 2012-12-11 MED FILL — Mirtazapine Tab 15 MG: 15.0000 mg | ORAL | Qty: 1 | Status: AC

## 2012-12-12 MED ADMIN — Clonidine HCl Tab 0.2 MG: 0.2 mg | ORAL | NDC 62584033911

## 2012-12-12 MED ADMIN — Clonidine HCl Tab 0.2 MG: 0.2 mg | ORAL | NDC 00904565661

## 2012-12-12 MED ADMIN — Hydroxyzine HCl Tab 25 MG: 25 mg | ORAL | NDC 63739048610

## 2012-12-12 MED ADMIN — Lisinopril Tab 5 MG: 5 mg | ORAL | NDC 00904581161

## 2012-12-12 MED ADMIN — Hydrochlorothiazide Tab 25 MG: 25 mg | ORAL | NDC 68084008611

## 2012-12-12 MED ADMIN — Naproxen Tab 250 MG: 250 mg | ORAL | NDC 00904606961

## 2012-12-12 MED ADMIN — Acetaminophen Tab 325 MG: 650 mg | ORAL | NDC 00904198261

## 2012-12-12 MED ADMIN — Quetiapine Fumarate Tab 100 MG: 100 mg | ORAL | NDC 00904627961

## 2012-12-12 MED ADMIN — Fluoxetine HCl Cap 10 MG: 10 mg | ORAL | NDC 65862019201

## 2012-12-12 MED ADMIN — Sertraline HCl Tab 50 MG: 50 mg | ORAL | NDC 68084018111

## 2012-12-12 MED ADMIN — Mirtazapine Tab 15 MG: 15 mg | ORAL | NDC 63739035510

## 2012-12-12 MED ADMIN — Prazosin HCl Cap 2 MG: 2 mg | ORAL | NDC 51079063001

## 2012-12-12 MED ADMIN — Meloxicam Tab 7.5 MG: 7.5 mg | ORAL | NDC 61442012601

## 2012-12-12 MED FILL — Quetiapine Fumarate Tab 100 MG: 100.0000 mg | ORAL | Qty: 1 | Status: CN

## 2012-12-12 MED FILL — Quetiapine Fumarate Tab 100 MG: 100.0000 mg | ORAL | Qty: 1 | Status: AC

## 2012-12-12 MED FILL — Naproxen Tab 250 MG: 250.0000 mg | ORAL | Qty: 1 | Status: AC

## 2012-12-12 MED FILL — Naproxen Tab 250 MG: 250.0000 mg | ORAL | Qty: 1 | Status: CN

## 2012-12-12 MED FILL — Fluoxetine HCl Cap 20 MG: 20.0000 mg | ORAL | Qty: 1 | Status: AC

## 2012-12-12 MED FILL — Fluoxetine HCl Cap 20 MG: 20.0000 mg | ORAL | Qty: 1 | Status: CN

## 2012-12-12 NOTE — Progress Notes (Signed)
Patient ID: Ronald Hampton, male   DOB: 1980-11-10, 32 y.o.   MRN: [id number] Patient ID: Ronald Hampton, male   DOB: [birth year], 32 y.o.   MRN: [id number] [facility] MD Progress Note  12/12/2012 2:32 PM Ronald Hampton  MRN:  [id number] Subjective:  Ronald Hampton reports that some of his symptoms are starting to decrease. He states "I only had one nightmare last night. Usually I have many more. I'm not hearing voices. I think the medications are starting to work again." He reports having accepted that he will need to stay in therapy and on medications. Patient stated "It's hard opening up to new people. I'm always wondering whether they are judging me privately." Rates depression at seven. Passive SI but reports thoughts are fading.   Diagnosis:   Axis I: Major Depression, Recurrent severe Axis II: Deferred Axis III:  Past Medical History  Diagnosis Date  . Hypertension   . Depression   . Bipolar 1 disorder   . Chronic foot pain   . Elevated LFTs    Axis IV: occupational problems and other psychosocial or environmental problems Axis V: 41-50 serious symptoms  ADL's:  Improved  Sleep: Poor  Appetite:  Fair   Psychiatric Specialty Exam: Review of Systems  Constitutional: Negative for malaise/fatigue.  Eyes: Negative.   Respiratory: Negative.   Cardiovascular: Negative.   Gastrointestinal: Negative for nausea (Reports the symptom is decreasing in severity. ).  Genitourinary: Negative.   Musculoskeletal: Negative.   Skin: Negative.   Neurological: Headaches: Requested tylenol.  Endo/Heme/Allergies: Negative.   Psychiatric/Behavioral: Positive for depression and suicidal ideas. Negative for hallucinations (Hears voices off an on), memory loss and substance abuse. The patient is nervous/anxious and has insomnia.     Blood pressure 111/67, pulse 98, temperature 97.7 F (36.5 C), temperature source Oral, resp. rate 16, height 5\' 11"  (1.803 m), weight 110.224 kg (243 lb).Body mass index is 33.91 kg/(m^2).   General Appearance: Disheveled  Eye Solicitor::  Fair  Speech:  Slow  Volume:  Decreased  Mood:  Anxious, Depressed and Dysphoric  Affect:  Constricted and Depressed  Thought Process:  Circumstantial  Orientation:  Full (Time, Place, and Person)  Thought Content:  Rumination  Suicidal Thoughts:  Passive SI no plan  Homicidal Thoughts:  Passive HI no plan  Memory:  Immediate;   Fair Recent;   Fair Remote;   Fair  Judgement:  Fair  Insight:  Present  Psychomotor Activity:  Decreased  Concentration:  Fair  Recall:  Fair  Akathisia:  No  Handed:  Right  AIMS (if indicated):     Assets:  Communication Skills Desire for Improvement  Sleep:  Number of Hours: 5.75   Current Medications: Current Facility-Administered Medications  Medication Dose Route Frequency Provider Last Rate Last Dose  . acetaminophen (TYLENOL) tablet 650 mg  650 mg Oral Q6H PRN Earney Navy, NP   650 mg at 12/12/12 1207  . alum & mag hydroxide-simeth (MAALOX/MYLANTA) 200-200-20 MG/5ML suspension 30 mL  30 mL Oral Q4H PRN Earney Navy, NP      . cloNIDine (CATAPRES) tablet 0.2 mg  0.2 mg Oral TID Earney Navy, NP   0.2 mg at 12/12/12 1206  . [START ON 12/13/2012] FLUoxetine (PROZAC) capsule 20 mg  20 mg Oral Daily Fransisca Kaufmann, NP      . hydrochlorothiazide (HYDRODIURIL) tablet 25 mg  25 mg Oral Daily Fransisca Kaufmann, NP   25 mg at 12/12/12 0725  . hydrOXYzine (ATARAX/VISTARIL) tablet 25 mg  25  mg Oral Q6H PRN Fransisca Kaufmann, NP   25 mg at 12/12/12 1206  . lisinopril (PRINIVIL,ZESTRIL) tablet 5 mg  5 mg Oral Daily Fransisca Kaufmann, NP   5 mg at 12/12/12 0725  . magnesium hydroxide (MILK OF MAGNESIA) suspension 30 mL  30 mL Oral Daily PRN Earney Navy, NP      . mirtazapine (REMERON) tablet 15 mg  15 mg Oral QHS PRN Jorje Guild, PA-C   15 mg at 12/11/12 2247  . naproxen (NAPROSYN) tablet 250 mg  250 mg Oral BID WC Fransisca Kaufmann, NP      . prazosin (MINIPRESS) capsule 2 mg  2 mg Oral QHS Fransisca Kaufmann, NP   2 mg  at 12/11/12 2246  . pseudoephedrine (SUDAFED) tablet 30 mg  30 mg Oral Q6H PRN Kerry Hough, PA-C   30 mg at 12/11/12 0730  . QUEtiapine (SEROQUEL) tablet 100 mg  100 mg Oral BID Fransisca Kaufmann, NP        Lab Results:  No results found for this or any previous visit (from the past 48 hour(s)).  Physical Findings: AIMS: Facial and Oral Movements Muscles of Facial Expression: None, normal Lips and Perioral Area: None, normal Jaw: None, normal Tongue: None, normal,Extremity Movements Upper (arms, wrists, hands, fingers): None, normal Lower (legs, knees, ankles, toes): None, normal, Trunk Movements Neck, shoulders, hips: None, normal, Overall Severity Severity of abnormal movements (highest score from questions above): None, normal Incapacitation due to abnormal movements: None, normal Patient's awareness of abnormal movements (rate only patient's report): No Awareness, Dental Status Current problems with teeth and/or dentures?: No Does patient usually wear dentures?: No  CIWA:    COWS:     Treatment Plan Summary: Daily contact with patient to assess and evaluate symptoms and progress in treatment Medication management  Plan: Continue crisis management and stabilization.  Medication management: Continue current regimen. Increase Prozac to 20 mg and d/c zoloft. Patient request Seroquel 100 mg bid times to changed include a dose close to bedtime. Patient also reported that naproxen worked better for his pain.  Encouraged patient to attend groups and participate in group counseling sessions and activities.  Discharge plan in progress. Anticipate d/c early next week.  Address health issues: Vitals reviewed and stable.  Continue current treatment plan.   Medical Decision Making Problem Points:  Established problem, stable/improving (1), Review of last therapy session (1) and Review of psycho-social stressors (1) Data Points:  Review of medication regiment & side effects (2) Review of  new medications or change in dosage (2)  I certify that inpatient services furnished can reasonably be expected to improve the patient's condition.   Maddalyn Lutze NP-C 12/12/2012, 2:32 PM

## 2012-12-13 MED ADMIN — Methocarbamol Tab 500 MG: 500 mg | ORAL | NDC 63739016610

## 2012-12-13 MED ADMIN — Clonidine HCl Tab 0.2 MG: 0.2 mg | ORAL | NDC 62584033911

## 2012-12-13 MED ADMIN — Nabumetone Tab 500 MG: 500 mg | ORAL | NDC 68084005111

## 2012-12-13 MED ADMIN — Hydroxyzine HCl Tab 25 MG: 25 mg | ORAL | NDC 63739048610

## 2012-12-13 MED ADMIN — Lisinopril Tab 5 MG: 5 mg | ORAL | NDC 00904581161

## 2012-12-13 MED ADMIN — Hydrochlorothiazide Tab 25 MG: 25 mg | ORAL | NDC 68084008611

## 2012-12-13 MED ADMIN — Naproxen Tab 250 MG: 250 mg | ORAL | NDC 00093014719

## 2012-12-13 MED ADMIN — Acetaminophen Tab 325 MG: 650 mg | ORAL | NDC 00904198261

## 2012-12-13 MED ADMIN — Gabapentin Cap 100 MG: 100 mg | ORAL | NDC 68084007911

## 2012-12-13 MED ADMIN — Quetiapine Fumarate Tab 200 MG: 200 mg | ORAL | NDC 00904627961

## 2012-12-13 MED ADMIN — Lamotrigine Tab 25 MG: 25 mg | ORAL | NDC 51079049801

## 2012-12-13 MED ADMIN — Fluoxetine HCl Cap 20 MG: 20 mg | ORAL | NDC 00904578561

## 2012-12-13 MED ADMIN — Prazosin HCl Cap 2 MG: 2 mg | ORAL | NDC 51079063001

## 2012-12-13 MED ADMIN — Quetiapine Fumarate Tab 100 MG: 100 mg | ORAL | NDC 00904627961

## 2012-12-13 MED FILL — Methocarbamol Tab 500 MG: 500.0000 mg | ORAL | Qty: 1 | Status: AC

## 2012-12-13 MED FILL — Methocarbamol Tab 500 MG: 500.0000 mg | ORAL | Qty: 1 | Status: CN

## 2012-12-13 MED FILL — Nabumetone Tab 500 MG: 500.0000 mg | ORAL | Qty: 1 | Status: AC

## 2012-12-13 MED FILL — Nabumetone Tab 500 MG: 500.0000 mg | ORAL | Qty: 1 | Status: CN

## 2012-12-13 MED FILL — Lamotrigine Tab 25 MG: 25.0000 mg | ORAL | Qty: 1 | Status: CN

## 2012-12-13 MED FILL — Lamotrigine Tab 25 MG: 25.0000 mg | ORAL | Qty: 1 | Status: AC

## 2012-12-13 MED FILL — Gabapentin Cap 100 MG: 100.0000 mg | ORAL | Qty: 1 | Status: AC

## 2012-12-13 MED FILL — Gabapentin Cap 100 MG: 100.0000 mg | ORAL | Qty: 1 | Status: CN

## 2012-12-13 MED FILL — Quetiapine Fumarate Tab 200 MG: 200.0000 mg | ORAL | Qty: 1 | Status: CN

## 2012-12-13 MED FILL — Quetiapine Fumarate Tab 200 MG: 200.0000 mg | ORAL | Qty: 1 | Status: AC

## 2012-12-13 MED FILL — Quetiapine Fumarate Tab 100 MG: 100.0000 mg | ORAL | Qty: 1 | Status: AC

## 2012-12-13 MED FILL — Quetiapine Fumarate Tab 100 MG: 100.0000 mg | ORAL | Qty: 1 | Status: CN

## 2012-12-14 MED ADMIN — Methocarbamol Tab 500 MG: 500 mg | ORAL | NDC 63739016610

## 2012-12-14 MED ADMIN — Clonidine HCl Tab 0.2 MG: 0.2 mg | ORAL | NDC 62584033911

## 2012-12-14 MED ADMIN — Nabumetone Tab 500 MG: 500 mg | ORAL | NDC 68084005111

## 2012-12-14 MED ADMIN — Hydroxyzine HCl Tab 25 MG: 25 mg | ORAL | NDC 63739048610

## 2012-12-14 MED ADMIN — Lisinopril Tab 5 MG: 5 mg | ORAL | NDC 68084006011

## 2012-12-14 MED ADMIN — Hydrochlorothiazide Tab 25 MG: 25 mg | ORAL | NDC 68084008611

## 2012-12-14 MED ADMIN — Gabapentin Cap 100 MG: 100 mg | ORAL | NDC 68084007911

## 2012-12-14 MED ADMIN — Quetiapine Fumarate Tab 200 MG: 200 mg | ORAL | NDC 00904628061

## 2012-12-14 MED ADMIN — Lamotrigine Tab 25 MG: 25 mg | ORAL | NDC 51079049801

## 2012-12-14 MED ADMIN — Quetiapine Fumarate Tab 100 MG: 100 mg | ORAL | NDC 00904627961

## 2012-12-14 MED ADMIN — Prazosin HCl Cap 2 MG: 2 mg | ORAL | NDC 51079063001

## 2012-12-15 ENCOUNTER — Encounter (HOSPITAL_COMMUNITY): Payer: Self-pay

## 2012-12-15 DIAGNOSIS — F1910 Other psychoactive substance abuse, uncomplicated: Secondary | ICD-10-CM

## 2012-12-15 DIAGNOSIS — F3130 Bipolar disorder, current episode depressed, mild or moderate severity, unspecified: Secondary | ICD-10-CM

## 2012-12-15 MED ADMIN — Methocarbamol Tab 500 MG: 500 mg | ORAL | NDC 63739016610

## 2012-12-15 MED ADMIN — Clindamycin HCl Cap 300 MG: 300 mg | ORAL | NDC 63304069201

## 2012-12-15 MED ADMIN — Lamotrigine Tab 25 MG: 50 mg | ORAL | NDC 51079049801

## 2012-12-15 MED ADMIN — Clonidine HCl Tab 0.2 MG: 0.2 mg | ORAL | NDC 62584033911

## 2012-12-15 MED ADMIN — Nabumetone Tab 500 MG: 500 mg | ORAL | NDC 68084005111

## 2012-12-15 MED ADMIN — Hydroxyzine HCl Tab 25 MG: 25 mg | ORAL | NDC 63739048610

## 2012-12-15 MED ADMIN — Lisinopril Tab 5 MG: 5 mg | ORAL | NDC 00904581161

## 2012-12-15 MED ADMIN — Hydrochlorothiazide Tab 25 MG: 25 mg | ORAL | NDC 63739012810

## 2012-12-15 MED ADMIN — Gabapentin Cap 100 MG: 100 mg | ORAL | NDC 68084007911

## 2012-12-15 MED ADMIN — Quetiapine Fumarate Tab 200 MG: 200 mg | ORAL | NDC 00904628061

## 2012-12-15 MED ADMIN — Pseudoephedrine HCl Tab 30 MG: 30 mg | ORAL | NDC 00904505360

## 2012-12-15 MED ADMIN — Lamotrigine Tab 25 MG: 25 mg | ORAL | NDC 51079049801

## 2012-12-15 MED ADMIN — Quetiapine Fumarate Tab 100 MG: 100 mg | ORAL | NDC 00904627961

## 2012-12-15 MED ADMIN — Prazosin HCl Cap 2 MG: 2 mg | ORAL | NDC 51079063001

## 2012-12-15 MED FILL — Clindamycin HCl Cap 300 MG: 300.0000 mg | ORAL | Qty: 28 | Status: CN

## 2012-12-15 MED FILL — Clindamycin HCl Cap 300 MG: 300.0000 mg | ORAL | Qty: 1 | Status: AC

## 2012-12-15 MED FILL — Clindamycin HCl Cap 300 MG: 300.0000 mg | ORAL | Qty: 28 | Status: AC

## 2012-12-15 MED FILL — Clindamycin HCl Cap 150 MG: 300.0000 mg | ORAL | Qty: 2 | Status: AC

## 2012-12-15 MED FILL — Lamotrigine Tab 25 MG: 50.0000 mg | ORAL | Qty: 2 | Status: CN

## 2012-12-15 MED FILL — Lamotrigine Tab 25 MG: 50.0000 mg | ORAL | Qty: 2 | Status: AC

## 2012-12-15 MED FILL — Clindamycin HCl Cap 300 MG: 300.0000 mg | ORAL | Qty: 1 | Status: CN

## 2012-12-15 NOTE — Progress Notes (Signed)
Patient ID: Ronald Hampton, male   DOB: [birth year], 32 y.o.   MRN: [id number] Patient ID: Ronald Hampton, male   DOB: April 16, 1981, 32 y.o.   MRN: [id number] [facility] MD Progress Note  12/14/2012 Ronald Hampton  MRN:  [id number] Subjective:  Patient endorsing multiple symptoms today. Rates depression at eight, anxiety at five, hearing voices, increased mood swings, and passive SI to shoot self. His symptoms appear not to be improving despite multiple medication changes recently. He began to have more nightmares last night than previous nights.   Diagnosis:   Axis I: Bipolar, Depressed and Substance Abuse Axis II: Deferred Axis III:  Past Medical History  Diagnosis Date  . Hypertension   . Depression   . Bipolar 1 disorder   . Chronic foot pain   . Elevated LFTs    Axis IV: economic problems, housing problems, other psychosocial or environmental problems, problems related to social environment and problems with primary support group Axis V: 41-50 serious symptoms  ADL's:  Intact  Sleep: Poor  Appetite:  Fair  Suicidal Ideation:  Plan:  "If not in here, shoot myself" Intent:  Yes Means:  No Homicidal Ideation:  Plan:  None Intent:  None Means:  No AEB (as evidenced by):  Psychiatric Specialty Exam: Review of Systems  Constitutional: Negative.   HENT: Negative.   Eyes: Negative.   Respiratory: Negative.   Cardiovascular: Negative.   Gastrointestinal: Negative.   Genitourinary: Negative.   Musculoskeletal: Negative.        Right leg pain  Skin: Negative.   Neurological: Negative.   Endo/Heme/Allergies: Negative.   Psychiatric/Behavioral: Positive for depression, suicidal ideas, hallucinations and substance abuse. Negative for memory loss. The patient is nervous/anxious. The patient does not have insomnia.     Blood pressure 90/58, pulse 118, temperature 98.3 F (36.8 C), temperature source Oral, resp. rate 16, height 5\' 11"  (1.803 m), weight 110.224 kg (243 lb).Body mass index is  33.91 kg/(m^2).  General Appearance: Casual  Eye Contact::  Fair  Speech:  Normal Rate  Volume:  Normal  Mood:  Anxious and Depressed  Affect:  Congruent  Thought Process:  Coherent  Orientation:  Full (Time, Place, and Person)  Thought Content:  Hallucinations: Auditory  Suicidal Thoughts:  Yes.  with intent/plan  Homicidal Thoughts:  Yes.  without intent/plan  Memory:  Immediate;   Fair Recent;   Fair Remote;   Fair  Judgement:  Fair  Insight:  Fair  Psychomotor Activity:  Normal  Concentration:  Fair  Recall:  Fair  Akathisia:  No  Handed:  Right  AIMS (if indicated):     Assets:  Communication Skills Resilience  Sleep:  Number of Hours: 6   Current Medications: Current Facility-Administered Medications  Medication Dose Route Frequency Provider Last Rate Last Dose  . acetaminophen (TYLENOL) tablet 650 mg  650 mg Oral Q6H PRN Earney Navy, NP   650 mg at 12/13/12 2222  . alum & mag hydroxide-simeth (MAALOX/MYLANTA) 200-200-20 MG/5ML suspension 30 mL  30 mL Oral Q4H PRN Earney Navy, NP      . cloNIDine (CATAPRES) tablet 0.2 mg  0.2 mg Oral TID Earney Navy, NP   0.2 mg at 12/15/12 1150  . gabapentin (NEURONTIN) capsule 100 mg  100 mg Oral TID Nanine Means, NP   100 mg at 12/15/12 1150  . hydrochlorothiazide (HYDRODIURIL) tablet 25 mg  25 mg Oral Daily Fransisca Kaufmann, NP   25 mg at 12/15/12 0829  . hydrOXYzine (ATARAX/VISTARIL) tablet  25 mg  25 mg Oral Q6H PRN Fransisca Kaufmann, NP   25 mg at 12/14/12 0810  . lamoTRIgine (LAMICTAL) tablet 50 mg  50 mg Oral BID Fransisca Kaufmann, NP      . lisinopril (PRINIVIL,ZESTRIL) tablet 5 mg  5 mg Oral Daily Fransisca Kaufmann, NP   5 mg at 12/15/12 0829  . magnesium hydroxide (MILK OF MAGNESIA) suspension 30 mL  30 mL Oral Daily PRN Earney Navy, NP      . methocarbamol (ROBAXIN) tablet 500 mg  500 mg Oral TID Nanine Means, NP   500 mg at 12/15/12 1150  . mirtazapine (REMERON) tablet 15 mg  15 mg Oral QHS PRN Jorje Guild, PA-C   15 mg  at 12/12/12 2135  . nabumetone (RELAFEN) tablet 500 mg  500 mg Oral BID Nanine Means, NP   500 mg at 12/15/12 0829  . prazosin (MINIPRESS) capsule 2 mg  2 mg Oral QHS Fransisca Kaufmann, NP   2 mg at 12/14/12 2148  . pseudoephedrine (SUDAFED) tablet 30 mg  30 mg Oral Q6H PRN Kerry Hough, PA-C   30 mg at 12/15/12 0830  . QUEtiapine (SEROQUEL) tablet 100 mg  100 mg Oral QPC breakfast Nanine Means, NP   100 mg at 12/15/12 0829  . QUEtiapine (SEROQUEL) tablet 200 mg  200 mg Oral QHS Nanine Means, NP   200 mg at 12/14/12 2148    Lab Results: No results found for this or any previous visit (from the past 48 hour(s)).  Physical Findings: AIMS: Facial and Oral Movements Muscles of Facial Expression: None, normal Lips and Perioral Area: None, normal Jaw: None, normal Tongue: None, normal,Extremity Movements Upper (arms, wrists, hands, fingers): None, normal Lower (legs, knees, ankles, toes): None, normal, Trunk Movements Neck, shoulders, hips: None, normal, Overall Severity Severity of abnormal movements (highest score from questions above): None, normal Incapacitation due to abnormal movements: None, normal Patient's awareness of abnormal movements (rate only patient's report): No Awareness, Dental Status Current problems with teeth and/or dentures?: No Does patient usually wear dentures?: No  CIWA:    COWS:     Treatment Plan Summary: Daily contact with patient to assess and evaluate symptoms and progress in treatment Medication management  Plan:  Review of chart, vital signs, medications, and notes. 1-Individual and group therapy 2-Medication management for depression and anxiety:  Medications reviewed with the patient and his pain medication was not effective--Patient concerned about not being on an antidepressant since prozac d/ced. Educated about the benefits of Lamictal which was increased to 50 mg bid as patient complains of increased mood swings.  3-Coping skills for depression,  anxiety, and mood stability 4-Continue crisis stabilization and management 5-Address health issues--monitoring vital signs, stable 6-Treatment plan in progress to prevent relapse of depression, mood stability, and anxiety  Medical Decision Making Problem Points:  Established problem, stable/improving (1) and Review of psycho-social stressors (1) Data Points:  Review of new medications or change in dosage (2)  I certify that inpatient services furnished can reasonably be expected to improve the patient's condition.  Fransisca Kaufmann NP-C 2:56 PM 12/15/2012

## 2012-12-16 MED ADMIN — Methocarbamol Tab 500 MG: 500 mg | ORAL | NDC 63739016610

## 2012-12-16 MED ADMIN — Clindamycin HCl Cap 300 MG: 300 mg | ORAL | NDC 68084024411

## 2012-12-16 MED ADMIN — Lamotrigine Tab 25 MG: 50 mg | ORAL | NDC 51079049801

## 2012-12-16 MED ADMIN — Clonidine HCl Tab 0.2 MG: 0.2 mg | ORAL | NDC 62584033911

## 2012-12-16 MED ADMIN — Nabumetone Tab 500 MG: 500 mg | ORAL | NDC 68084005111

## 2012-12-16 MED ADMIN — Hydroxyzine HCl Tab 25 MG: 25 mg | ORAL | NDC 63739048610

## 2012-12-16 MED ADMIN — Lisinopril Tab 5 MG: 5 mg | ORAL | NDC 00904581161

## 2012-12-16 MED ADMIN — Hydrochlorothiazide Tab 25 MG: 25 mg | ORAL | NDC 63739012810

## 2012-12-16 MED ADMIN — Gabapentin Cap 100 MG: 100 mg | ORAL | NDC 68084007911

## 2012-12-16 MED ADMIN — Quetiapine Fumarate Tab 200 MG: 200 mg | ORAL | NDC 00904628061

## 2012-12-16 MED ADMIN — Quetiapine Fumarate Tab 100 MG: 100 mg | ORAL | NDC 00904627961

## 2012-12-16 MED ADMIN — Lamotrigine Tab 100 MG: 100 mg | ORAL | NDC 68084031911

## 2012-12-16 MED ADMIN — Prazosin HCl Cap 2 MG: 2 mg | ORAL | NDC 51079063001

## 2012-12-16 MED ADMIN — Clindamycin HCl Cap 150 MG: 300 mg | ORAL | NDC 63304069201

## 2012-12-16 MED FILL — Quetiapine Fumarate Tab 200 MG: 200.0000 mg | ORAL | Qty: 1 | Status: AC

## 2012-12-16 MED FILL — Lamotrigine Tab 100 MG: 100.0000 mg | ORAL | Qty: 1 | Status: AC

## 2012-12-17 MED ADMIN — Methocarbamol Tab 500 MG: 500 mg | ORAL | NDC 63739016610

## 2012-12-17 MED ADMIN — Clindamycin HCl Cap 300 MG: 300 mg | ORAL | NDC 68084024411

## 2012-12-17 MED ADMIN — Clonidine HCl Tab 0.2 MG: 0.2 mg | ORAL | NDC 62584033911

## 2012-12-17 MED ADMIN — Nabumetone Tab 500 MG: 500 mg | ORAL | NDC 68084005111

## 2012-12-17 MED ADMIN — Lisinopril Tab 5 MG: 5 mg | ORAL | NDC 00904581161

## 2012-12-17 MED ADMIN — Hydrochlorothiazide Tab 25 MG: 25 mg | ORAL | NDC 63739012810

## 2012-12-17 MED ADMIN — Pseudoephedrine HCl Tab 30 MG: 30 mg | ORAL | NDC 00904505360

## 2012-12-17 MED ADMIN — Quetiapine Fumarate Tab 200 MG: 200 mg | ORAL | NDC 00904628061

## 2012-12-17 MED ADMIN — Lamotrigine Tab 100 MG: 100 mg | ORAL | NDC 68084031911

## 2012-12-17 MED ADMIN — Prazosin HCl Cap 2 MG: 2 mg | ORAL | NDC 51079063001

## 2012-12-17 MED ADMIN — Diphenhydramine HCl Cap 25 MG: 25 mg | ORAL | NDC 00904530661

## 2012-12-17 MED FILL — Diphenhydramine HCl Cap 25 MG: 25.0000 mg | ORAL | Qty: 1 | Status: AC

## 2012-12-18 MED ADMIN — Methocarbamol Tab 500 MG: 500 mg | ORAL | NDC 63739016610

## 2012-12-18 MED ADMIN — Clindamycin HCl Cap 300 MG: 300 mg | ORAL | NDC 68084024411

## 2012-12-18 MED ADMIN — Clonidine HCl Tab 0.2 MG: 0.2 mg | ORAL | NDC 62584033911

## 2012-12-18 MED ADMIN — Lithium Carbonate Tab ER 450 MG: 450 mg | ORAL | NDC 68462022401

## 2012-12-18 MED ADMIN — Nabumetone Tab 500 MG: 500 mg | ORAL | NDC 68084005111

## 2012-12-18 MED ADMIN — Lisinopril Tab 5 MG: 5 mg | ORAL | NDC 00904581161

## 2012-12-18 MED ADMIN — Hydrochlorothiazide Tab 25 MG: 25 mg | ORAL | NDC 63739012810

## 2012-12-18 MED ADMIN — Quetiapine Fumarate Tab 200 MG: 200 mg | ORAL | NDC 67877024633

## 2012-12-18 MED ADMIN — Lamotrigine Tab 100 MG: 100 mg | ORAL | NDC 68084031911

## 2012-12-18 MED ADMIN — Prazosin HCl Cap 2 MG: 2 mg | ORAL | NDC 51079063001

## 2012-12-18 MED ADMIN — Diphenhydramine HCl Cap 25 MG: 25 mg | ORAL | NDC 00904530661

## 2012-12-18 MED FILL — Lithium Carbonate Tab ER 450 MG: 450.0000 mg | ORAL | Qty: 1 | Status: AC

## 2012-12-18 MED FILL — Lithium Carbonate Tab ER 450 MG: 450.0000 mg | ORAL | Qty: 1 | Status: CN

## 2012-12-18 MED FILL — Lithium Carbonate Tab ER 450 MG: 450.0000 mg | ORAL | Qty: 28 | Status: CN

## 2012-12-18 MED FILL — Lithium Carbonate Tab ER 450 MG: 450.0000 mg | ORAL | Qty: 28 | Status: AC

## 2012-12-18 MED FILL — Quetiapine Fumarate Tab 200 MG: 200.0000 mg | ORAL | Qty: 14 | Status: CN

## 2012-12-18 MED FILL — Quetiapine Fumarate Tab 200 MG: 200.0000 mg | ORAL | Qty: 14 | Status: AC

## 2012-12-18 MED FILL — Quetiapine Fumarate Tab 200 MG: 200.0000 mg | ORAL | Qty: 1 | Status: CN
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
## 2012-12-19 DIAGNOSIS — F411 Generalized anxiety disorder: Secondary | ICD-10-CM

## 2012-12-19 DIAGNOSIS — F339 Major depressive disorder, recurrent, unspecified: Secondary | ICD-10-CM

## 2012-12-19 MED ADMIN — Methocarbamol Tab 500 MG: 500 mg | ORAL | NDC 63739016610

## 2012-12-19 MED ADMIN — Clindamycin HCl Cap 300 MG: 300 mg | ORAL | NDC 68084024411

## 2012-12-19 MED ADMIN — Clonidine HCl Tab 0.2 MG: 0.2 mg | ORAL | NDC 62584033911

## 2012-12-19 MED ADMIN — Lithium Carbonate Tab ER 450 MG: 450 mg | ORAL | NDC 68462022401

## 2012-12-19 MED ADMIN — Nabumetone Tab 500 MG: 500 mg | ORAL | NDC 68084005111

## 2012-12-19 MED ADMIN — Lisinopril Tab 5 MG: 5 mg | ORAL | NDC 68084006011

## 2012-12-19 MED ADMIN — Hydrochlorothiazide Tab 25 MG: 25 mg | ORAL | NDC 68084008611

## 2012-12-19 MED ADMIN — Quetiapine Fumarate Tab 200 MG: 200 mg | ORAL | NDC 00904628061

## 2012-12-19 MED ADMIN — Prazosin HCl Cap 2 MG: 2 mg | ORAL | NDC 51079063001
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
## 2012-12-19 NOTE — Discharge Instructions (Signed)
Manic Depression (Bipolar Disorder) Bipolar disorder is also known as manic depressive illness. It is when the brain does not function properly and causes shifts in a person's moods, energy and ability to function in everyday life. These shifts are different from the normal ups and downs that everyone experiences. Instead the shifts are severe. If this goes untreated, the person's life becomes more and more disorderly. People with this disorder can be treated can lead full and productive lives. This disorder must be managed throughout life.  SYMPTOMS   Bipolar disorder causes dramatic mood swings. These mood swings go in cycles. They cycle from extreme "highs" and irritable to deep "lows" of sadness and hopelessness.  Between the extreme moods, there are usually periods of normal mood.  Along with the mood shifts, the person will have severe changes in energy and behavior. The periods of "highs" and "lows" are called episodes of mania and depression. Signs of mania:  Lots of energy, activity and restlessness.  Extreme "high" or good mood.  Extreme irritability.  Racing thoughts and talking very fast.  Jumping from one idea to another.  Not able to focus, easily distracted.  Little need to sleep.  Grand beliefs in one's abilities and powers.  Spending sprees.  Increased sexual drive. This can result in many sexual partners.  Poor judgment.  Abuse of drugs, particularly cocaine, alcohol, and sleeping medication.  Aggressive or provocative behavior.  A lasting period of behavior that is different from usual.  Denial that anything is wrong. *A manic episode is identified if a "high" mood happens with three or more of the other symptoms lasting most of the day, nearly everyday for a week or longer. If the mood is more irritable in nature, four additional symptoms must be present. Signs of depression:  Lasting feelings of sadness, anxiety, or empty mood.  Feelings of  hopelessness with negative thoughts.  Feelings of guilt, worthlessness, or helplessness.  Loss of interest or pleasure in activities once enjoyed, including sex.  Feelings of fatigue or having less energy.  Trouble focusing, making decisions, remembering.  Feeling restless or irritable.  Sleeping too little or too much.  Change in eating with possible weight gain or loss.  Feeling ongoing pain that is not caused by physical illness or injury.  Thoughts of death or suicide or suicide attempts. *A depressive episode is identified as having five or more of the above symptoms that last most of the day, nearly everyday for two weeks or longer. CAUSES   Research shows that there is no single cause for the disorder. Many factors act together to produce the illness.  This can be passed down from family (hereditary).  Environment may play a part. TREATMENT   Long-term treatment is strongly recommended because bipolar disorder is a repeated illness. This disorder is better controlled if treatment is ongoing than if it is off and on.  A combination of medication and talk therapy is best for managing the disorder over time.  Medication.  Medication can be prescribed by a doctor that is an expert in treating mental disorders (psychiatrists). Medications known as "mood stabilizers" are usually prescribed to help control the illness. Other medications can be added when needed. These medicines usually treat episodes of mania or depression that break through despite the mood stabilizer.  Talk Therapy.  Along with medication, some forms of talk therapy are helpful in providing support, education and guidance to people with the illness and their families. Studies show that this type  of treatment increases mood stability, decreases need for hospitalization and improves how they function society.  Electroconvulsive Therapy (ECT).  In extreme situations where the above treatments do not work or  work too slowly to relieve severe symptoms, ECT may be considered. Document Released: 10/08/2000 Document Revised: 09/24/2011 Document Reviewed: 05/30/2007 [facility] Patient Information 2014 [place], [place].

## 2012-12-19 NOTE — Progress Notes (Signed)
[facility] MD Progress Note  12/19/2012 5:48 PM Ronald Hampton  MRN:  [id number] Subjective:  States that he does not feel safe to be discharged today. He is concerned that he was stsrted on Lithium yesterday and that he was told that it would take couple of days to get him sable on this mediation. He claims that he still has intermittent suicidal ideas  Diagnosis:  Major Depression Recurrent, Anxiety Disorder NOS  ADL's:  Intact  Sleep: Fair  Appetite:  Fair  Suicidal Ideation:  Plan:  denies Intent:  denies Means:  denies Homicidal Ideation:  Plan:  denies Intent:  denies Means:  denies AEB (as evidenced by):  Psychiatric Specialty Exam: Review of Systems  Constitutional: Negative.   HENT: Negative.   Eyes: Negative.   Respiratory: Negative.   Cardiovascular: Negative.   Gastrointestinal: Negative.   Genitourinary: Negative.   Musculoskeletal: Negative.   Skin: Negative.   Neurological: Positive for dizziness.  Endo/Heme/Allergies: Negative.   Psychiatric/Behavioral: Positive for depression and suicidal ideas. The patient is nervous/anxious.     Blood pressure 97/67, pulse 90, temperature 97.7 F (36.5 C), temperature source Oral, resp. rate 16, height 5\' 11"  (1.803 m), weight 110.224 kg (243 lb).Body mass index is 33.91 kg/(m^2).  General Appearance: Fairly Groomed  Patent attorney::  Fair  Speech:  Clear and Coherent  Volume:  Decreased  Mood:  Anxious, Depressed and worried  Affect:  Restricted  Thought Process:  Coherent and Goal Directed  Orientation:  Full (Time, Place, and Person)  Thought Content:  worries, concerns abouthis readuiness for discharge considering he was started on Lithium yesterday  Suicidal Thoughts:  Intermittent  Homicidal Thoughts:  No  Memory:  Immediate;   Fair Recent;   Fair Remote;   Fair  Judgement:  Fair  Insight:  Present and Shallow  Psychomotor Activity:  Restlessness  Concentration:  Fair  Recall:  Fair  Akathisia:  No  Handed:   Right  AIMS (if indicated):     Assets:  Desire for Improvement  Sleep:  Number of Hours: 6.5   Current Medications: Current Facility-Administered Medications  Medication Dose Route Frequency Provider Last Rate Last Dose  . acetaminophen (TYLENOL) tablet 650 mg  650 mg Oral Q6H PRN Earney Navy, NP   650 mg at 12/13/12 2222  . alum & mag hydroxide-simeth (MAALOX/MYLANTA) 200-200-20 MG/5ML suspension 30 mL  30 mL Oral Q4H PRN Earney Navy, NP      . clindamycin (CLEOCIN) capsule 300 mg  300 mg Oral Q6H Spencer E Simon, PA-C   300 mg at 12/19/12 1728  . cloNIDine (CATAPRES) tablet 0.2 mg  0.2 mg Oral TID Earney Navy, NP   0.2 mg at 12/19/12 1727  . hydrochlorothiazide (HYDRODIURIL) tablet 25 mg  25 mg Oral Daily Fransisca Kaufmann, NP   25 mg at 12/19/12 0759  . hydrOXYzine (ATARAX/VISTARIL) tablet 25 mg  25 mg Oral Q6H PRN Fransisca Kaufmann, NP      . lisinopril (PRINIVIL,ZESTRIL) tablet 5 mg  5 mg Oral Daily Fransisca Kaufmann, NP   5 mg at 12/19/12 0759  . lithium carbonate (ESKALITH) CR tablet 450 mg  450 mg Oral Q12H Nehemiah Settle, MD   450 mg at 12/19/12 0759  . magnesium hydroxide (MILK OF MAGNESIA) suspension 30 mL  30 mL Oral Daily PRN Earney Navy, NP      . methocarbamol (ROBAXIN) tablet 500 mg  500 mg Oral TID Nanine Means, NP   500 mg at  12/19/12 1727  . nabumetone (RELAFEN) tablet 500 mg  500 mg Oral BID Nanine Means, NP   500 mg at 12/19/12 1727  . prazosin (MINIPRESS) capsule 2 mg  2 mg Oral QHS Fransisca Kaufmann, NP   2 mg at 12/18/12 2130  . pseudoephedrine (SUDAFED) tablet 30 mg  30 mg Oral Q6H PRN Kerry Hough, PA-C   30 mg at 12/17/12 8657  . QUEtiapine (SEROQUEL) tablet 200 mg  200 mg Oral QHS Verne Spurr, PA-C   200 mg at 12/18/12 2155    Lab Results: No results found for this or any previous visit (from the past 48 hour(s)).  Physical Findings: AIMS: Facial and Oral Movements Muscles of Facial Expression: None, normal Lips and Perioral Area: None,  normal Jaw: None, normal Tongue: None, normal,Extremity Movements Upper (arms, wrists, hands, fingers): None, normal Lower (legs, knees, ankles, toes): None, normal, Trunk Movements Neck, shoulders, hips: None, normal, Overall Severity Severity of abnormal movements (highest score from questions above): None, normal Incapacitation due to abnormal movements: None, normal Patient's awareness of abnormal movements (rate only patient's report): No Awareness, Dental Status Current problems with teeth and/or dentures?: No Does patient usually wear dentures?: No  CIWA:    COWS:     Treatment Plan Summary: Daily contact with patient to assess and evaluate symptoms and progress in treatment Medication management  Plan: Supportive approach/coping skills           Will get a Lithium level. He is using diuretics what can increase the Lithium level           Will adjust the dosage accordingly and activate the D/C in the next 48-72 hours  Medical Decision Making Problem Points:  Review of psycho-social stressors (1) Data Points:  Review of medication regiment & side effects (2) Review of new medications or change in dosage (2)  I certify that inpatient services furnished can reasonably be expected to improve the patient's condition.   Tija Biss A 12/19/2012, 5:48 PM

## 2012-12-20 LAB — LITHIUM LEVEL: Lithium Lvl: 0.55 meq/L — ABNORMAL LOW (ref 0.80–1.40)

## 2012-12-20 MED ADMIN — Methocarbamol Tab 500 MG: 500 mg | ORAL | NDC 63739016610

## 2012-12-20 MED ADMIN — Clindamycin HCl Cap 300 MG: 300 mg | ORAL | NDC 68084024411

## 2012-12-20 MED ADMIN — Clonidine HCl Tab 0.2 MG: 0.2 mg | ORAL | NDC 62584033911

## 2012-12-20 MED ADMIN — Lithium Carbonate Tab ER 450 MG: 450 mg | ORAL | NDC 68462022401

## 2012-12-20 MED ADMIN — Nabumetone Tab 500 MG: 500 mg | ORAL | NDC 68084005111

## 2012-12-20 MED ADMIN — Lisinopril Tab 5 MG: 5 mg | ORAL | NDC 00904581161

## 2012-12-20 MED ADMIN — Hydrochlorothiazide Tab 25 MG: 25 mg | ORAL | NDC 63739012810

## 2012-12-20 MED ADMIN — Quetiapine Fumarate Tab 200 MG: 200 mg | ORAL | NDC 00904628061

## 2012-12-20 MED ADMIN — Prazosin HCl Cap 2 MG: 2 mg | ORAL | NDC 51079063001

## 2012-12-20 MED ADMIN — Ondansetron Orally Disintegrating Tab 4 MG: 4 mg | ORAL | NDC 68462015713

## 2012-12-20 NOTE — Progress Notes (Signed)
Patient ID: Ronald Hampton, male   DOB: [birth year], 32 y.o.   MRN: [id number] [facility] MD Progress Note  12/20/2012 11:11 AM Ronald Hampton  MRN:  [id number] Subjective:  Patient reports a significant improvement in his symptoms. Arther states "I feel better. I don't mind getting up in the morning. I have more energy." Also states "I don't need any more medications today. I'm fine with my current regimen." Denies hearing any voices. Rates depression at six and feeling anxious at five but states "I stay at a three or four." Patient would like to discharge Monday after having a another lithium level drawn. Patient is attending groups and appears to be engaged. His affect is much more expressive and brighter. [name] feels like the lithium is helping to improve his depressive symptoms.   Diagnosis:  Major Depression Recurrent, Anxiety Disorder NOS  ADL's:  Intact  Sleep: Fair  Appetite:  Fair  Suicidal Ideation:  Passive SI but reports it is "90% better."  Homicidal Ideation:  Plan:  denies Intent:  denies Means:  denies AEB (as evidenced by):  Psychiatric Specialty Exam: Review of Systems  Constitutional: Negative.   HENT: Negative.   Eyes: Negative.   Respiratory: Negative.   Cardiovascular: Negative.   Gastrointestinal: Positive for nausea.  Genitourinary: Negative.   Musculoskeletal: Negative.   Skin: Negative.   Neurological: Negative for dizziness.  Endo/Heme/Allergies: Negative.   Psychiatric/Behavioral: Positive for depression and suicidal ideas. The patient is nervous/anxious.     Blood pressure 112/73, pulse 96, temperature 96.8 F (36 C), temperature source Oral, resp. rate 16, height 5\' 11"  (1.803 m), weight 110.224 kg (243 lb).Body mass index is 33.91 kg/(m^2).  General Appearance: Fairly Groomed  Patent attorney::  Fair  Speech:  Clear and Coherent  Volume:  Decreased  Mood:  Depressed  Affect:  Restricted  Thought Process:  Coherent and Goal Directed  Orientation:  Full (Time,  Place, and Person)  Thought Content:  Ruminating about conflict with MD  Suicidal Thoughts:  Intermittent  Homicidal Thoughts:  No  Memory:  Immediate;   Fair Recent;   Fair Remote;   Fair  Judgement:  Fair  Insight:  Shallow  Psychomotor Activity:  WNL  Concentration:  Fair  Recall:  Fair  Akathisia:  No  Handed:  Right  AIMS (if indicated):     Assets:  Desire for Improvement  Sleep:  Number of Hours: 6.25   Current Medications: Current Facility-Administered Medications  Medication Dose Route Frequency Provider Last Rate Last Dose  . acetaminophen (TYLENOL) tablet 650 mg  650 mg Oral Q6H PRN Earney Navy, NP   650 mg at 12/13/12 2222  . alum & mag hydroxide-simeth (MAALOX/MYLANTA) 200-200-20 MG/5ML suspension 30 mL  30 mL Oral Q4H PRN Earney Navy, NP      . clindamycin (CLEOCIN) capsule 300 mg  300 mg Oral Q6H Kerry Hough, PA-C   300 mg at 12/20/12 1308  . cloNIDine (CATAPRES) tablet 0.2 mg  0.2 mg Oral TID Earney Navy, NP   0.2 mg at 12/20/12 0824  . hydrochlorothiazide (HYDRODIURIL) tablet 25 mg  25 mg Oral Daily Fransisca Kaufmann, NP   25 mg at 12/20/12 0824  . hydrOXYzine (ATARAX/VISTARIL) tablet 25 mg  25 mg Oral Q6H PRN Fransisca Kaufmann, NP      . lisinopril (PRINIVIL,ZESTRIL) tablet 5 mg  5 mg Oral Daily Fransisca Kaufmann, NP   5 mg at 12/20/12 0823  . lithium carbonate (ESKALITH) CR tablet 450 mg  450 mg Oral Q12H Nehemiah Settle, MD   450 mg at 12/20/12 0823  . magnesium hydroxide (MILK OF MAGNESIA) suspension 30 mL  30 mL Oral Daily PRN Earney Navy, NP      . methocarbamol (ROBAXIN) tablet 500 mg  500 mg Oral TID Nanine Means, NP   500 mg at 12/20/12 1610  . nabumetone (RELAFEN) tablet 500 mg  500 mg Oral BID Nanine Means, NP   500 mg at 12/20/12 9604  . ondansetron (ZOFRAN-ODT) disintegrating tablet 4 mg  4 mg Oral Q8H PRN Fransisca Kaufmann, NP      . prazosin (MINIPRESS) capsule 2 mg  2 mg Oral QHS Fransisca Kaufmann, NP   2 mg at 12/19/12 2129  .  pseudoephedrine (SUDAFED) tablet 30 mg  30 mg Oral Q6H PRN Kerry Hough, PA-C   30 mg at 12/17/12 5409  . QUEtiapine (SEROQUEL) tablet 200 mg  200 mg Oral QHS Verne Spurr, PA-C   200 mg at 12/19/12 2129    Lab Results:  Results for orders placed during the hospital encounter of 12/08/12 (from the past 48 hour(s))  LITHIUM LEVEL     Status: Abnormal   Collection Time    12/20/12  6:20 AM      Result Value Range   Lithium Lvl 0.55 (*) 0.80 - 1.40 mEq/L    Physical Findings: AIMS: Facial and Oral Movements Muscles of Facial Expression: None, normal Lips and Perioral Area: None, normal Jaw: None, normal Tongue: None, normal,Extremity Movements Upper (arms, wrists, hands, fingers): None, normal Lower (legs, knees, ankles, toes): None, normal, Trunk Movements Neck, shoulders, hips: None, normal, Overall Severity Severity of abnormal movements (highest score from questions above): None, normal Incapacitation due to abnormal movements: None, normal Patient's awareness of abnormal movements (rate only patient's report): No Awareness, Dental Status Current problems with teeth and/or dentures?: No Does patient usually wear dentures?: No  CIWA:    COWS:     Treatment Plan Summary: Daily contact with patient to assess and evaluate symptoms and progress in treatment Medication management  Plan:  Continue crisis management and stabilization.  Medication management: Continue current medication regimen. Patient is tolerating lithium. Last level is sub-therapeutic. Will recheck lithium level on Monday morning.  Encouraged patient to attend groups and participate in group counseling sessions and activities.  Discharge plan in progress. Anticipate d/c Monday.  Address health issues: Vitals reviewed and stable.  Continue current treatment plan.   Medical Decision Making Problem Points:  Review of psycho-social stressors (1) Data Points:  Review of medication regiment & side effects  (2) Review of new medications or change in dosage (2)  I certify that inpatient services furnished can reasonably be expected to improve the patient's condition.   Antavion Bartoszek NP-C 12/20/2012, 11:11 AM

## 2012-12-21 MED ADMIN — Methocarbamol Tab 500 MG: 500 mg | ORAL | NDC 63739016610

## 2012-12-21 MED ADMIN — Clindamycin HCl Cap 300 MG: 300 mg | ORAL | NDC 68084024411

## 2012-12-21 MED ADMIN — Clonidine HCl Tab 0.2 MG: 0.2 mg | ORAL | NDC 62584033911

## 2012-12-21 MED ADMIN — Lithium Carbonate Tab ER 450 MG: 450 mg | ORAL | NDC 68462022401

## 2012-12-21 MED ADMIN — Nabumetone Tab 500 MG: 500 mg | ORAL | NDC 68084005111

## 2012-12-21 MED ADMIN — Nabumetone Tab 500 MG: 500 mg | ORAL | NDC 68084024611

## 2012-12-21 MED ADMIN — Lisinopril Tab 5 MG: 5 mg | ORAL | NDC 00904581161

## 2012-12-21 MED ADMIN — Hydrochlorothiazide Tab 25 MG: 25 mg | ORAL | NDC 63739012810

## 2012-12-21 MED ADMIN — Quetiapine Fumarate Tab 200 MG: 200 mg | ORAL | NDC 00904628061

## 2012-12-21 MED ADMIN — Pseudoephedrine HCl Tab 30 MG: 30 mg | ORAL | NDC 00904505360

## 2012-12-21 MED ADMIN — Prazosin HCl Cap 2 MG: 2 mg | ORAL | NDC 51079063001

## 2012-12-22 DIAGNOSIS — F333 Major depressive disorder, recurrent, severe with psychotic symptoms: Principal | ICD-10-CM

## 2012-12-22 LAB — LITHIUM LEVEL: Lithium Lvl: 0.66 meq/L — ABNORMAL LOW (ref 0.80–1.40)

## 2012-12-22 MED ADMIN — Methocarbamol Tab 500 MG: 500 mg | ORAL | NDC 63739016610

## 2012-12-22 MED ADMIN — Clindamycin HCl Cap 300 MG: 300 mg | ORAL | NDC 68084024411

## 2012-12-22 MED ADMIN — Clonidine HCl Tab 0.2 MG: 0.2 mg | ORAL | NDC 62584033911

## 2012-12-22 MED ADMIN — Lithium Carbonate Tab ER 450 MG: 450 mg | ORAL | NDC 68462022401

## 2012-12-22 MED ADMIN — Nabumetone Tab 500 MG: 500 mg | ORAL | NDC 68084005111

## 2012-12-22 MED ADMIN — Lisinopril Tab 5 MG: 5 mg | ORAL | NDC 68084006011

## 2012-12-22 MED ADMIN — Hydrochlorothiazide Tab 25 MG: 25 mg | ORAL | NDC 63739012810

## 2012-12-22 MED ADMIN — Hydroxyzine HCl Tab 25 MG: 25 mg | ORAL | NDC 63739048610
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
## 2012-12-25 NOTE — Progress Notes (Signed)
Patient Discharge Instructions:  After Visit Summary (AVS):   Faxed to:  12/25/12 Discharge Summary Note:   Faxed to:  12/25/12 Psychiatric Admission Assessment Note:   Faxed to:  12/25/12 Suicide Risk Assessment - Discharge Assessment:   Faxed to:  12/25/12 Faxed/Sent to the Next Level Care provider:  12/25/12 Faxed to [facility] @ [phone] Faxed to [facility] @ [phone]  Jerelene Redden, 12/25/2012, 2:46 PM

## 2017-10-21 ENCOUNTER — Encounter (HOSPITAL_COMMUNITY): Payer: Self-pay | Admitting: Family Medicine

## 2017-10-21 ENCOUNTER — Emergency Department (HOSPITAL_COMMUNITY)
Admission: EM | Admit: 2017-10-21 | Discharge: 2017-10-22 | Disposition: A | Discharge status: Other Acute Inpatient Hospital | Payer: Self-pay | Attending: Emergency Medicine | Admitting: Emergency Medicine

## 2017-10-21 ENCOUNTER — Other Ambulatory Visit: Payer: Self-pay

## 2017-10-21 DIAGNOSIS — R45851 Suicidal ideations: Secondary | ICD-10-CM | POA: Insufficient documentation

## 2017-10-21 DIAGNOSIS — F315 Bipolar disorder, current episode depressed, severe, with psychotic features: Secondary | ICD-10-CM | POA: Diagnosis present

## 2017-10-21 DIAGNOSIS — F314 Bipolar disorder, current episode depressed, severe, without psychotic features: Secondary | ICD-10-CM | POA: Insufficient documentation

## 2017-10-21 DIAGNOSIS — Z79899 Other long term (current) drug therapy: Secondary | ICD-10-CM | POA: Insufficient documentation

## 2017-10-21 DIAGNOSIS — F4310 Post-traumatic stress disorder, unspecified: Secondary | ICD-10-CM | POA: Diagnosis present

## 2017-10-21 DIAGNOSIS — R739 Hyperglycemia, unspecified: Secondary | ICD-10-CM | POA: Insufficient documentation

## 2017-10-21 HISTORY — DX: Depression, unspecified: F32.A

## 2017-10-21 HISTORY — DX: Bipolar disorder, unspecified: F31.9

## 2017-10-21 HISTORY — DX: Post-traumatic stress disorder, unspecified: F43.10

## 2017-10-21 HISTORY — DX: Major depressive disorder, single episode, unspecified: F32.9

## 2017-10-21 LAB — COMPREHENSIVE METABOLIC PANEL
ALT: 44 U/L (ref 17–63)
AST: 26 U/L (ref 15–41)
Albumin: 4.2 g/dL (ref 3.5–5.0)
Alkaline Phosphatase: 108 U/L (ref 38–126)
Anion gap: 15 (ref 5–15)
BUN: 16 mg/dL (ref 6–20)
CO2: 21 mmol/L — ABNORMAL LOW (ref 22–32)
Calcium: 9.9 mg/dL (ref 8.9–10.3)
Chloride: 94 mmol/L — ABNORMAL LOW (ref 101–111)
Creatinine, Ser: 0.93 mg/dL (ref 0.61–1.24)
GFR calc Af Amer: >60 mL/min (ref >60)
GFR calc non Af Amer: >60 mL/min (ref >60)
Glucose, Bld: 512 mg/dL (ref 65–99)
Potassium: 4.1 mmol/L (ref 3.5–5.1)
Sodium: 130 mmol/L — ABNORMAL LOW (ref 135–145)
Total Bilirubin: 0.9 mg/dL (ref 0.3–1.2)
Total Protein: 8.2 g/dL — ABNORMAL HIGH (ref 6.5–8.1)

## 2017-10-21 LAB — CBG MONITORING, ED
Glucose-Capillary: 476 mg/dL — ABNORMAL HIGH (ref 65–99)
Glucose-Capillary: 305 mg/dL — ABNORMAL HIGH (ref 65–99)

## 2017-10-21 LAB — CBC
HCT: 45.6 % (ref 39.0–52.0)
Hemoglobin: 15.9 g/dL (ref 13.0–17.0)
MCH: 28.5 pg (ref 26.0–34.0)
MCHC: 34.9 g/dL (ref 30.0–36.0)
MCV: 81.9 fL (ref 78.0–100.0)
Platelets: 321 10*3/uL (ref 150–400)
RBC: 5.57 MIL/uL (ref 4.22–5.81)
RDW: 13.7 % (ref 11.5–15.5)
WBC: 8.7 10*3/uL (ref 4.0–10.5)

## 2017-10-21 LAB — SALICYLATE LEVEL: Salicylate Lvl: <7 mg/dL (ref 2.8–30.0)

## 2017-10-21 LAB — ACETAMINOPHEN LEVEL: Acetaminophen (Tylenol), Serum: <10 ug/mL — ABNORMAL LOW (ref 10–30)

## 2017-10-21 LAB — ETHANOL: Alcohol, Ethyl (B): <10 mg/dL (ref <10)

## 2017-10-21 MED ORDER — SODIUM CHLORIDE 0.9 % IV BOLUS
1000.0000 mL | Freq: Once | INTRAVENOUS | Status: AC
  Administered 2017-10-21: 1000 mL via INTRAVENOUS

## 2017-10-21 MED ORDER — METFORMIN HCL 500 MG PO TABS
500.0000 mg | ORAL_TABLET | Freq: Two times a day (BID) | ORAL | Status: DC
  Administered 2017-10-22: 500 mg via ORAL
  Filled 2017-10-21 (×2): qty 1

## 2017-10-21 MED ORDER — METFORMIN HCL 500 MG PO TABS
500.0000 mg | ORAL_TABLET | Freq: Once | ORAL | Status: AC
  Administered 2017-10-21: 500 mg via ORAL

## 2017-10-21 NOTE — ED Notes (Signed)
Asked pt again if they could give urine sample; pt states "I still can't pee".
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
## 2017-10-21 NOTE — ED Notes (Signed)
Pt given coke and sandwich  ?
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
## 2017-10-21 NOTE — ED Notes (Signed)
I ask patient for a urine sample and he stated he is unable to go at this time

## 2017-10-21 NOTE — ED Notes (Signed)
Date and time results received: 10/21/17 21:26 (use smartphrase ".now" to insert current time)  Test: Glucose Critical Value: 512  Name of Provider Notified: Dr. Alvino Chapel. Informed Earnest Bailey, Therapist, sports.   Orders Received? Or Actions Taken?: Will continue to monitor patient and await for new orders.

## 2017-10-21 NOTE — ED Notes (Signed)
Patient was wanded by security and escorted to hall D. Patient has one white belongings bag that has been placed in the cabinet over 19-22 nurses station. Ilene RN and [employer] was given report about patient. Patient has a urine cup with him for when he can urinate.

## 2017-10-22 ENCOUNTER — Encounter: Payer: Self-pay | Admitting: Psychiatry

## 2017-10-22 ENCOUNTER — Inpatient Hospital Stay
Admission: RE | Admit: 2017-10-22 | Discharge: 2017-10-28 | DRG: 885 | Disposition: A | Discharge status: Home / Self Care | Payer: No Typology Code available for payment source | Source: Intra-hospital | Attending: Psychiatry | Admitting: Psychiatry

## 2017-10-22 DIAGNOSIS — F5105 Insomnia due to other mental disorder: Secondary | ICD-10-CM | POA: Diagnosis present

## 2017-10-22 DIAGNOSIS — R197 Diarrhea, unspecified: Secondary | ICD-10-CM | POA: Diagnosis not present

## 2017-10-22 DIAGNOSIS — F1120 Opioid dependence, uncomplicated: Secondary | ICD-10-CM | POA: Diagnosis present

## 2017-10-22 DIAGNOSIS — R450 Nervousness: Secondary | ICD-10-CM

## 2017-10-22 DIAGNOSIS — F1123 Opioid dependence with withdrawal: Secondary | ICD-10-CM | POA: Diagnosis present

## 2017-10-22 DIAGNOSIS — Z915 Personal history of self-harm: Secondary | ICD-10-CM | POA: Diagnosis not present

## 2017-10-22 DIAGNOSIS — R45851 Suicidal ideations: Secondary | ICD-10-CM

## 2017-10-22 DIAGNOSIS — F4310 Post-traumatic stress disorder, unspecified: Secondary | ICD-10-CM

## 2017-10-22 DIAGNOSIS — F1010 Alcohol abuse, uncomplicated: Secondary | ICD-10-CM | POA: Diagnosis present

## 2017-10-22 DIAGNOSIS — F410 Panic disorder [episodic paroxysmal anxiety] without agoraphobia: Secondary | ICD-10-CM | POA: Diagnosis present

## 2017-10-22 DIAGNOSIS — E1165 Type 2 diabetes mellitus with hyperglycemia: Secondary | ICD-10-CM | POA: Diagnosis present

## 2017-10-22 DIAGNOSIS — I10 Essential (primary) hypertension: Secondary | ICD-10-CM | POA: Diagnosis present

## 2017-10-22 DIAGNOSIS — Z653 Problems related to other legal circumstances: Secondary | ICD-10-CM | POA: Diagnosis not present

## 2017-10-22 DIAGNOSIS — F4010 Social phobia, unspecified: Secondary | ICD-10-CM | POA: Diagnosis present

## 2017-10-22 DIAGNOSIS — F419 Anxiety disorder, unspecified: Secondary | ICD-10-CM

## 2017-10-22 DIAGNOSIS — R4587 Impulsiveness: Secondary | ICD-10-CM

## 2017-10-22 DIAGNOSIS — Z818 Family history of other mental and behavioral disorders: Secondary | ICD-10-CM | POA: Diagnosis not present

## 2017-10-22 DIAGNOSIS — F332 Major depressive disorder, recurrent severe without psychotic features: Secondary | ICD-10-CM

## 2017-10-22 DIAGNOSIS — F315 Bipolar disorder, current episode depressed, severe, with psychotic features: Secondary | ICD-10-CM | POA: Diagnosis not present

## 2017-10-22 DIAGNOSIS — Z91410 Personal history of adult physical and sexual abuse: Secondary | ICD-10-CM

## 2017-10-22 DIAGNOSIS — F428 Other obsessive-compulsive disorder: Secondary | ICD-10-CM | POA: Diagnosis present

## 2017-10-22 DIAGNOSIS — Z888 Allergy status to other drugs, medicaments and biological substances status: Secondary | ICD-10-CM | POA: Diagnosis not present

## 2017-10-22 DIAGNOSIS — Z882 Allergy status to sulfonamides status: Secondary | ICD-10-CM

## 2017-10-22 DIAGNOSIS — F429 Obsessive-compulsive disorder, unspecified: Secondary | ICD-10-CM | POA: Diagnosis present

## 2017-10-22 DIAGNOSIS — IMO0002 Reserved for concepts with insufficient information to code with codable children: Secondary | ICD-10-CM | POA: Diagnosis present

## 2017-10-22 DIAGNOSIS — F319 Bipolar disorder, unspecified: Secondary | ICD-10-CM | POA: Diagnosis present

## 2017-10-22 DIAGNOSIS — R45850 Homicidal ideations: Secondary | ICD-10-CM

## 2017-10-22 LAB — RAPID URINE DRUG SCREEN, HOSP PERFORMED
Amphetamines: NOT DETECTED
Barbiturates: NOT DETECTED
Benzodiazepines: NOT DETECTED
Cocaine: NOT DETECTED
Opiates: POSITIVE — AB
Tetrahydrocannabinol: NOT DETECTED

## 2017-10-22 LAB — I-STAT CHEM 8, ED
BUN: 10 mg/dL (ref 6–20)
Calcium, Ion: 1.2 mmol/L (ref 1.15–1.40)
Chloride: 98 mmol/L — ABNORMAL LOW (ref 101–111)
Creatinine, Ser: 0.6 mg/dL — ABNORMAL LOW (ref 0.61–1.24)
Glucose, Bld: 364 mg/dL — ABNORMAL HIGH (ref 65–99)
HCT: 40 % (ref 39.0–52.0)
Hemoglobin: 13.6 g/dL (ref 13.0–17.0)
Potassium: 4.1 mmol/L (ref 3.5–5.1)
Sodium: 134 mmol/L — ABNORMAL LOW (ref 135–145)
TCO2: 25 mmol/L (ref 22–32)

## 2017-10-22 LAB — HEMOGLOBIN A1C
Hgb A1c MFr Bld: 12 % — ABNORMAL HIGH (ref 4.8–5.6)
Hgb A1c MFr Bld: 11.6 % — ABNORMAL HIGH (ref 4.8–5.6)
Mean Plasma Glucose: 297.7 mg/dL
Mean Plasma Glucose: 286.22 mg/dL

## 2017-10-22 LAB — CBG MONITORING, ED
Glucose-Capillary: 385 mg/dL — ABNORMAL HIGH (ref 65–99)
Glucose-Capillary: 318 mg/dL — ABNORMAL HIGH (ref 65–99)

## 2017-10-22 MED ORDER — BUPRENORPHINE HCL-NALOXONE HCL 2-0.5 MG SL SUBL
1.0000 | SUBLINGUAL_TABLET | Freq: Every day | SUBLINGUAL | Status: AC
  Administered 2017-10-23 – 2017-10-24 (×2): 1 via SUBLINGUAL
  Filled 2017-10-22 (×2): qty 1

## 2017-10-22 MED ORDER — ACETAMINOPHEN 325 MG PO TABS
650.0000 mg | ORAL_TABLET | Freq: Four times a day (QID) | ORAL | Status: DC | PRN
  Administered 2017-10-24 – 2017-10-27 (×3): 650 mg via ORAL
  Filled 2017-10-22 (×3): qty 2

## 2017-10-22 MED ORDER — METFORMIN HCL 500 MG PO TABS
500.0000 mg | ORAL_TABLET | Freq: Two times a day (BID) | ORAL | Status: DC
  Administered 2017-10-23 – 2017-10-28 (×10): 500 mg via ORAL
  Filled 2017-10-22 (×11): qty 1

## 2017-10-22 MED ORDER — BUPRENORPHINE HCL-NALOXONE HCL 8-2 MG SL SUBL
1.0000 | SUBLINGUAL_TABLET | Freq: Once | SUBLINGUAL | Status: AC
  Administered 2017-10-22: 1 via SUBLINGUAL
  Filled 2017-10-22: qty 1

## 2017-10-22 MED ORDER — ALUM & MAG HYDROXIDE-SIMETH 200-200-20 MG/5ML PO SUSP
30.0000 mL | ORAL | Status: DC | PRN
Start: 2017-10-22 — End: 2017-10-28

## 2017-10-22 MED ORDER — PRAZOSIN HCL 1 MG PO CAPS
1.0000 mg | ORAL_CAPSULE | Freq: Every day | ORAL | Status: DC
  Filled 2017-10-22: qty 1

## 2017-10-22 MED ORDER — MAGNESIUM HYDROXIDE 400 MG/5ML PO SUSP: 30.0000 mL | Freq: Every day | ORAL | Status: DC | PRN

## 2017-10-22 MED ORDER — INSULIN ASPART 100 UNIT/ML ~~LOC~~ SOLN
0.0000 [IU] | SUBCUTANEOUS | Status: DC
  Administered 2017-10-22: 11 [IU] via SUBCUTANEOUS
  Filled 2017-10-22: qty 1

## 2017-10-22 MED ORDER — TRAZODONE HCL 100 MG PO TABS
100.0000 mg | ORAL_TABLET | Freq: Every day | ORAL | Status: DC
  Administered 2017-10-22: 100 mg via ORAL
  Filled 2017-10-22: qty 1

## 2017-10-22 MED ORDER — HYDROXYZINE HCL 50 MG PO TABS
50.0000 mg | ORAL_TABLET | Freq: Three times a day (TID) | ORAL | Status: DC | PRN
  Administered 2017-10-25 – 2017-10-28 (×5): 50 mg via ORAL
  Filled 2017-10-22 (×5): qty 1

## 2017-10-22 MED ORDER — INSULIN ASPART 100 UNIT/ML ~~LOC~~ SOLN
0.0000 [IU] | Freq: Three times a day (TID) | SUBCUTANEOUS | Status: DC
  Administered 2017-10-23: 7 [IU] via SUBCUTANEOUS

## 2017-10-22 MED ORDER — INSULIN DETEMIR 100 UNIT/ML ~~LOC~~ SOLN
10.0000 [IU] | Freq: Every day | SUBCUTANEOUS | Status: DC
  Filled 2017-10-22: qty 0.1

## 2017-10-22 NOTE — BH Assessment (Addendum)
[facility] Assessment Progress Note  Per Buford Dresser, DO, this pt requires psychiatric hospitalization. At 13:35 [place], Counselor, reports that pt has been accepted to [facility] by Dr Bary Leriche to Rm 303A.  Pt has signed Voluntary Admission and Consent for Treatment, as well as Consent to Release Information, and signed forms have been faxed to [phone].  Pt's nurse, Ronalee Belts, has been notified, and agrees to call report to [phone].  Pt is to be transported via Stacey Drain, Ama Coordinator [phone]   Addendum:  At 15:02 Peyton Bottoms confirms receipt of pt's signed documents.  Jalene Mullet, [place] Coordinator [phone]

## 2017-10-22 NOTE — Consult Note (Signed)
Psychiatry: Received call from admitting nurse regarding this patient.  Blood sugar was found to be very elevated at outside hospital which is a new finding.  Based on this diet is being changed to carbohydrate modified and I am putting him on a very modest sliding scale of insulin as well as low-dose metformin.  Nursing tells me patient is complaining of symptoms of opiate withdrawal and has visible track marks.  Review of the old notes from the outside hospital suggest that the patient was minimizing his opiate abuse.  No harm from a couple days of Suboxone if he is having opiate withdrawal symptoms.  Order completed.

## 2017-10-22 NOTE — [facility] Group Notes (Signed)
[facility] Group Notes:  (Nursing/MHT/Case Management/Adjunct)  Date:  10/22/2017  Time:  9:00 PM  Type of Therapy:  Group Therapy  Participation Level:  Did Not Attend  Summary of Progress/Problems:  Ronald Hampton 10/22/2017, 9:00 PM

## 2017-10-22 NOTE — Progress Notes (Signed)
Inpatient Diabetes Program Recommendations  AACE/ADA: New Consensus Statement on Inpatient Glycemic Control (2015)  Target Ranges:  Prepandial:   less than 140 mg/dL      Peak postprandial:   less than 180 mg/dL (1-2 hours)      Critically ill patients:  140 - 180 mg/dL   Lab Results  Component Value Date   GLUCAP 305 (H) 10/21/2017   HGBA1C 12.0 (H) 10/21/2017    Review of Glycemic Control Results for Ronald Hampton, Ronald Hampton (MRN [id number]) as of 10/22/2017 10:29  Ref. Range 10/21/2017 21:58 10/21/2017 23:21  Glucose-Capillary Latest Ref Range: 65 - 99 mg/dL 476 (H) 305 (H)    Diabetes history: No previous history of DM noted Outpatient Diabetes medications: None Current orders for Inpatient glycemic control:  Metformin 500 mg bid  Inpatient Diabetes Program Recommendations:    Please consider changing diet to CHO modified.  A1C indicates average blood sugars = 300 mg/dL.  Please consider adding Novolog correction moderate tid with meals and HS while in the hospital.   Patient will need follow-up regarding A1C results and potential "new Diabetes" diagnosis.  He is not currently appropriate for education regarding DM at this time and will need to to be followed and educated when more stable.   Discussed with RN.   Thanks,  Adah Perl, RN, BC-ADM Inpatient Diabetes Coordinator Pager [phone]  (8a-5p)
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
## 2017-10-22 NOTE — Tx Team (Signed)
Initial Treatment Plan 10/22/2017 11:28 PM [name] MCE:[id number]    PATIENT STRESSORS: Financial difficulties Health problems Medication change or noncompliance Substance abuse   PATIENT STRENGTHS: Average or above average intelligence Capable of independent living Communication skills General fund of knowledge Motivation for treatment/growth   PATIENT IDENTIFIED PROBLEMS: Suicide Ideations D    Depression\ Anxiety    Bipolar 1 Disorder    PTSD         DISCHARGE CRITERIA:  Adequate post-discharge living arrangements Improved stabilization in mood, thinking, and/or behavior Medical problems require only outpatient monitoring Motivation to continue treatment in a less acute level of care Reduction of life-threatening or endangering symptoms to within safe limits  PRELIMINARY DISCHARGE PLAN: Attend 12-step recovery group Outpatient therapy Participate in family therapy Placement in alternative living arrangements  PATIENT/FAMILY INVOLVEMENT: This treatment plan has been presented to and reviewed with the patient, Ronald Hampton, .  The patient  have been given the opportunity to ask questions and make suggestions.  Clemens Catholic, RN 10/22/2017, 11:28 PM

## 2017-10-22 NOTE — ED Notes (Addendum)
Pt is sleeping, will obtain vital signs once pt has woken up.
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
## 2017-10-22 NOTE — ED Notes (Signed)
Report given to [facility] RN @ [place] [phone]. [facility] contacted and stated they are on the way.
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
## 2017-10-22 NOTE — BH Assessment (Signed)
Patient is to be admitted to [facility] by Dr. Bary Leriche.  Attending Physician will be Dr. Bary Leriche.   Patient has been assigned to room 303A, by [place] Nurse T'Yawn.   Representative: Gershon Mussel ([facility])

## 2017-10-22 NOTE — BHH Suicide Risk Assessment (Signed)
[facility] Admission Suicide Risk Assessment   Nursing information obtained from:  Patient Demographic factors:  Male Current Mental Status:  Self-harm thoughts Loss Factors:  Loss of significant relationship Historical Factors:  Anniversary of important loss Risk Reduction Factors:  Positive social support  Total Time spent with patient: 1 hour Principal Problem: Bipolar I disorder, current or most recent episode depressed, with psychotic features ([place]) Diagnosis:   Patient Active Problem List   Diagnosis Date Noted  . Bipolar I disorder, current or most recent episode depressed, with psychotic features ([place]) [F31.5] 10/22/2017    Priority: High  . DM (diabetes mellitus), type 2, uncontrolled ([place]) [E11.65] 10/23/2017  . HTN (hypertension) [I10] 10/23/2017  . PTSD (post-traumatic stress disorder) [F43.10] 10/22/2017  . Opioid use disorder, moderate, dependence ([place]) [F11.20] 10/22/2017  . Suicidal ideation [R45.851] 10/22/2017   Subjective Data: suicidal ideation with a plan  Continued Clinical Symptoms:  Alcohol Use Disorder Identification Test Final Score (AUDIT): 13 The "Alcohol Use Disorders Identification Test", Guidelines for Use in Primary Care, Second Edition.  World Pharmacologist [facility]). Score between 0-7:  no or low risk or alcohol related problems. Score between 8-15:  moderate risk of alcohol related problems. Score between 16-19:  high risk of alcohol related problems. Score 20 or above:  warrants further diagnostic evaluation for alcohol dependence and treatment.   CLINICAL FACTORS:   Bipolar Disorder:   Depressive phase Alcohol/Substance Abuse/Dependencies Obsessive-Compulsive Disorder   Musculoskeletal: Strength & Muscle Tone: within normal limits Gait & Station: normal Patient leans: N/A  Psychiatric Specialty Exam: Physical Exam  Nursing note and vitals reviewed. Psychiatric: His speech is normal and behavior is normal. His affect is blunt. Cognition  and memory are normal. He expresses impulsivity. He exhibits a depressed mood. He expresses suicidal ideation.    Review of Systems  Neurological: Negative.   Psychiatric/Behavioral: Positive for depression, hallucinations, substance abuse and suicidal ideas. The patient is nervous/anxious and has insomnia.   All other systems reviewed and are negative.   Blood pressure (!) 165/109, pulse 90, temperature 98.2 F (36.8 C), resp. rate 18, height 5\' 11"  (1.803 m), weight 103 kg (227 lb), SpO2 100 %.Body mass index is 31.66 kg/m.  General Appearance: Casual  Eye Contact:  Good  Speech:  Clear and Coherent  Volume:  Normal  Mood:  Depressed and Hopeless  Affect:  Flat  Thought Process:  Goal Directed and Descriptions of Associations: Intact  Orientation:  Full (Time, Place, and Person)  Thought Content:  WDL  Suicidal Thoughts:  Yes.  with intent/plan  Homicidal Thoughts:  No  Memory:  Immediate;   Fair Recent;   Fair Remote;   Fair  Judgement:  Poor  Insight:  Lacking  Psychomotor Activity:  Psychomotor Retardation  Concentration:  Concentration: Fair and Attention Span: Fair  Recall:  [employer] of Knowledge:  Fair  Language:  Fair  Akathisia:  No  Handed:  Right  AIMS (if indicated):     Assets:  Communication Skills Desire for Improvement Housing Physical Health Resilience Social Support  ADL's:  Intact  Cognition:  WNL  Sleep:  Number of Hours: 3.5      COGNITIVE FEATURES THAT CONTRIBUTE TO RISK:  None    SUICIDE RISK:   Severe:  Frequent, intense, and enduring suicidal ideation, specific plan, no subjective intent, but some objective markers of intent (i.e., choice of lethal method), the method is accessible, some limited preparatory behavior, evidence of impaired self-control, severe dysphoria/symptomatology, multiple risk factors  present, and few if any protective factors, particularly a lack of social support.  PLAN OF CARE: hospital admission, medication  management, substance abuse counseling, discharge planning.  Mr. Oyama is a 37 year old male with a history of depression, anxiety, mood instability and opioid dependence who was admitted for suicidal ideation with  plan in the context of severe social stressors and substance use. He wrote a suicide letter two wererks earlier with aplan to kill himself once his wife was out of town. His letter was discovered. While in the ER, he was newly diagnosed with diabetes.  #Suicidal ideation, pataient still suicida, not glad to be alive -patient is able to contract for safety  #Mood -start Trileptal 300 mg BID for mood stabilization -start Abilify 5 mg daily -give Abilify maintena injection if oral Abilify tolerated to improve compliance  #Anxiety of OCD and PTSD type  -Minipress 2 mg BID for nightmares and flashbacks -Luvox 50 mg nightly tomorrow for OCD -Vistaril 50 mg TID PRN  #Opioid dependence, patient in withdrawals -start Subutex taper -phenergan for nausea/vomiting  #Substance abuse treatment -opioids and alcohol -desires residential treatment  #DM -start Metformin 500 mg BID -start lantus 15 units daily -ADA diet, SSI, CBG -consult to [facility] -poor appetite due to withdrawals, hold SSI if >50% of meal consumed  #Metabolic syndrome monitoring -lipid panel, TSH and HgbA1C are pending -EKG pending  #Disposition -discharge to home with family -follow up with [facility]    I certify that inpatient services furnished can reasonably be expected to improve the patient's condition.   Orson Slick, MD 10/23/2017, 2:47 PM

## 2017-10-23 DIAGNOSIS — IMO0002 Reserved for concepts with insufficient information to code with codable children: Secondary | ICD-10-CM | POA: Diagnosis present

## 2017-10-23 DIAGNOSIS — F429 Obsessive-compulsive disorder, unspecified: Secondary | ICD-10-CM | POA: Diagnosis present

## 2017-10-23 DIAGNOSIS — F315 Bipolar disorder, current episode depressed, severe, with psychotic features: Principal | ICD-10-CM

## 2017-10-23 DIAGNOSIS — E1165 Type 2 diabetes mellitus with hyperglycemia: Secondary | ICD-10-CM | POA: Diagnosis present

## 2017-10-23 DIAGNOSIS — I10 Essential (primary) hypertension: Secondary | ICD-10-CM | POA: Diagnosis present

## 2017-10-23 LAB — BASIC METABOLIC PANEL
Anion gap: 9 (ref 5–15)
BUN: 8 mg/dL (ref 6–20)
CO2: 26 mmol/L (ref 22–32)
Calcium: 10.1 mg/dL (ref 8.9–10.3)
Chloride: 98 mmol/L — ABNORMAL LOW (ref 101–111)
Creatinine, Ser: 0.65 mg/dL (ref 0.61–1.24)
GFR calc Af Amer: >60 mL/min (ref >60)
GFR calc non Af Amer: >60 mL/min (ref >60)
Glucose, Bld: 314 mg/dL — ABNORMAL HIGH (ref 65–99)
Potassium: 3.8 mmol/L (ref 3.5–5.1)
Sodium: 133 mmol/L — ABNORMAL LOW (ref 135–145)

## 2017-10-23 LAB — GLUCOSE, CAPILLARY
Glucose-Capillary: 317 mg/dL — ABNORMAL HIGH (ref 65–99)
Glucose-Capillary: 415 mg/dL — ABNORMAL HIGH (ref 65–99)
Glucose-Capillary: 273 mg/dL — ABNORMAL HIGH (ref 65–99)
Glucose-Capillary: 264 mg/dL — ABNORMAL HIGH (ref 65–99)

## 2017-10-23 LAB — LIPID PANEL
Cholesterol: 176 mg/dL (ref 0–200)
HDL: 36 mg/dL — ABNORMAL LOW (ref >40)
LDL Cholesterol: 101 mg/dL — ABNORMAL HIGH (ref 0–99)
Total CHOL/HDL Ratio: 4.9 RATIO
Triglycerides: 193 mg/dL — ABNORMAL HIGH (ref <150)
VLDL: 39 mg/dL (ref 0–40)

## 2017-10-23 LAB — T4, FREE: Free T4: 1.06 ng/dL (ref 0.61–1.12)

## 2017-10-23 LAB — HEMOGLOBIN A1C
Hgb A1c MFr Bld: 11.6 % — ABNORMAL HIGH (ref 4.8–5.6)
Mean Plasma Glucose: 286.22 mg/dL

## 2017-10-23 LAB — TSH: TSH: 0.344 u[IU]/mL — ABNORMAL LOW (ref 0.350–4.500)

## 2017-10-23 MED ORDER — CLONIDINE HCL 0.1 MG PO TABS
0.1000 mg | ORAL_TABLET | Freq: Two times a day (BID) | ORAL | Status: DC
  Administered 2017-10-23 – 2017-10-28 (×7): 0.1 mg via ORAL
  Filled 2017-10-23 (×9): qty 1

## 2017-10-23 MED ORDER — PRAZOSIN HCL 2 MG PO CAPS
2.0000 mg | ORAL_CAPSULE | Freq: Two times a day (BID) | ORAL | Status: DC
  Administered 2017-10-23 – 2017-10-25 (×4): 2 mg via ORAL
  Filled 2017-10-23 (×4): qty 1

## 2017-10-23 MED ORDER — ZOLPIDEM TARTRATE 5 MG PO TABS
10.0000 mg | ORAL_TABLET | Freq: Every day | ORAL | Status: DC
  Administered 2017-10-23 – 2017-10-27 (×5): 10 mg via ORAL
  Filled 2017-10-23 (×5): qty 2

## 2017-10-23 MED ORDER — PREMIER PROTEIN SHAKE
11.0000 [oz_av] | Freq: Two times a day (BID) | ORAL | Status: DC
  Administered 2017-10-23 – 2017-10-28 (×11): 11 [oz_av] via ORAL

## 2017-10-23 MED ORDER — INSULIN ASPART 100 UNIT/ML ~~LOC~~ SOLN
0.0000 [IU] | Freq: Three times a day (TID) | SUBCUTANEOUS | Status: DC
  Administered 2017-10-23: 8 [IU] via SUBCUTANEOUS
  Administered 2017-10-23: 15 [IU] via SUBCUTANEOUS
  Administered 2017-10-24: 5 [IU] via SUBCUTANEOUS
  Administered 2017-10-24: 8 [IU] via SUBCUTANEOUS
  Administered 2017-10-24: 11 [IU] via SUBCUTANEOUS
  Administered 2017-10-25: 2 [IU] via SUBCUTANEOUS
  Administered 2017-10-25: 3 [IU] via SUBCUTANEOUS
  Administered 2017-10-25: 2 [IU] via SUBCUTANEOUS
  Administered 2017-10-26: 3 [IU] via SUBCUTANEOUS
  Administered 2017-10-26 (×2): 2 [IU] via SUBCUTANEOUS
  Administered 2017-10-27: 5 [IU] via SUBCUTANEOUS
  Administered 2017-10-27: 2 [IU] via SUBCUTANEOUS
  Administered 2017-10-27 – 2017-10-28 (×2): 3 [IU] via SUBCUTANEOUS
  Filled 2017-10-23 (×7): qty 1

## 2017-10-23 MED ORDER — PROMETHAZINE HCL 25 MG PO TABS: 25.0000 mg | ORAL_TABLET | ORAL | Status: DC | PRN

## 2017-10-23 MED ORDER — ARIPIPRAZOLE 5 MG PO TABS
5.0000 mg | ORAL_TABLET | Freq: Every day | ORAL | Status: DC
  Administered 2017-10-23 – 2017-10-24 (×2): 5 mg via ORAL
  Filled 2017-10-23 (×2): qty 1

## 2017-10-23 MED ORDER — LIVING WELL WITH DIABETES BOOK
Freq: Once | Status: AC
  Administered 2017-10-23: 11:00:00
  Filled 2017-10-23: qty 1

## 2017-10-23 MED ORDER — ADULT MULTIVITAMIN W/MINERALS CH
1.0000 | ORAL_TABLET | Freq: Every day | ORAL | Status: DC
  Administered 2017-10-23 – 2017-10-28 (×6): 1 via ORAL
  Filled 2017-10-23 (×6): qty 1

## 2017-10-23 MED ORDER — INSULIN GLARGINE 100 UNIT/ML ~~LOC~~ SOLN
15.0000 [IU] | SUBCUTANEOUS | Status: DC
  Administered 2017-10-23 – 2017-10-24 (×2): 15 [IU] via SUBCUTANEOUS
  Filled 2017-10-23 (×2): qty 0.15

## 2017-10-23 MED ORDER — OXCARBAZEPINE 300 MG PO TABS
300.0000 mg | ORAL_TABLET | Freq: Two times a day (BID) | ORAL | Status: DC
  Administered 2017-10-23 – 2017-10-28 (×10): 300 mg via ORAL
  Filled 2017-10-23 (×10): qty 1

## 2017-10-23 MED ORDER — INSULIN ASPART 100 UNIT/ML ~~LOC~~ SOLN
0.0000 [IU] | Freq: Every day | SUBCUTANEOUS | Status: DC
  Administered 2017-10-23: 3 [IU] via SUBCUTANEOUS
  Administered 2017-10-27: 0 [IU] via SUBCUTANEOUS
  Filled 2017-10-23: qty 1

## 2017-10-23 NOTE — Plan of Care (Signed)
Working on Radiographer, therapeutic . Denies suicidal  ideation . Conversation with patient  noted positive . Aware of disease concept .  Voice underderstanding of withdrawal ,voice no concerns  With understanding of medication  Received and  And understood  Information  On medication    Problem: Coping: Goal: Coping ability will improve Outcome: Progressing   Problem: Self-Concept: Goal: Ability to disclose and discuss suicidal ideas will improve Outcome: Progressing Goal: Will verbalize positive feelings about self Outcome: Progressing   Problem: Education: Goal: Knowledge of disease or condition will improve Outcome: Progressing Goal: Understanding of discharge needs will improve Outcome: Progressing   Problem: Physical Regulation: Goal: Complications related to the disease process, condition or treatment will be avoided or minimized Outcome: Progressing  Working on Radiographer, therapeutic . Denies suicidal  ideation . Conversation with patient  noted positive . Aware of disease concept .  Voice underderstanding of withdrawal ,voice Problem: Self-Concept: Goal: Will verbalize positive feelings about self Outcome: Progressing   Problem: Education: Goal: Utilization of techniques to improve thought processes will improve Outcome: Progressing Goal: Knowledge of the prescribed therapeutic regimen will improve Outcome: Progressing   Problem: Health Behavior/Discharge Planning: Goal: Ability to make decisions will improve Outcome: Progressing Goal: Compliance with therapeutic regimen will improve Outcome: Progressing

## 2017-10-23 NOTE — BHH Suicide Risk Assessment (Signed)
[place] INPATIENT:  Family/Significant Other Suicide Prevention Education  Suicide Prevention Education:  Patient Refusal for Family/Significant Other Suicide Prevention Education: The patient Ronald Hampton has refused to provide written consent for family/significant other to be provided Family/Significant Other Suicide Prevention Education during admission and/or prior to discharge.  Physician notified.  Darin Engels 10/23/2017, 10:01 AM

## 2017-10-23 NOTE — Progress Notes (Addendum)
Inpatient Diabetes Program Recommendations  AACE/ADA: New Consensus Statement on Inpatient Glycemic Control (2015)  Target Ranges:  Prepandial:   less than 140 mg/dL      Peak postprandial:   less than 180 mg/dL (1-2 hours)      Critically ill patients:  140 - 180 mg/dL   Results for TJAY, VELAZQUEZ (MRN [id number]) as of 10/23/2017 08:12  Ref. Range 10/21/2017 21:58 10/21/2017 23:21 10/22/2017 13:37 10/22/2017 16:01 10/23/2017 07:08  Glucose-Capillary Latest Ref Range: 65 - 99 mg/dL 476 (H) 305 (H) 385 (H) 318 (H) 317 (H)  Results for SUZANNE, GARBERS (MRN [id number]) as of 10/23/2017 08:12  Ref. Range 10/21/2017 20:10 10/22/2017 19:59  Hemoglobin A1C Latest Ref Range: 4.8 - 5.6 % 12.0 (H) 11.6 (H)   Review of Glycemic Control  Diabetes history: No Outpatient Diabetes medications: NA Current orders for Inpatient glycemic control: Novolog 0-9 units TID with meals, Novolog 0-5 units QHS, Metformin 500 mg BID  Inpatient Diabetes Program Recommendations:  Insulin - Basal: Please consider ordering Lantus 15 units Q24H starting now (based on 103 kg x 0.15 units). Correction (SSI): Please consider increasing Novolog correction to Moderate scale (0-15 units) TID with meals and add Novolog 0-5 units QHS for bedtime correction. HgbA1C: A1C 12.0% on 10/21/17 indicating an average glucose of 298 mg/dl over the past 2-3 months. Patient is being newly dx with DM this admission and will need to be educated on DM, CBG monitoring, and insulin while inpatient. Diet: Please change diet to Carb Modified.   Will plan to talk with patient today if appropriate for education.  Addendum 10/23/17 @ 14:10-Spoke with patient about new diabetes diagnosis.  Patient reports that he does not have any history of DM or pre-DM and no family history of DM that he is aware of.  Discussed A1C results (12% on 10/21/17) and explained what an A1C is and informed patient that his current A1C indicates an average glucose of 298 mg/dl over  the past 2-3 months. Discussed basic pathophysiology of DM Type 2, basic home care, importance of checking CBGs and maintaining good CBG control to prevent long-term and short-term complications. Reviewed glucose and A1C goals and explained that Dm medications will need to be adjusted until glucose is fairly well controlled.  Reviewed signs and symptoms of hyperglycemia and hypoglycemia along with treatment for both. Discussed impact of nutrition, exercise, stress, sickness, and medications on diabetes control. Discussed Carb Modified diet and explained that RD consult has been ordered so RD should be coming to provide further diet education.  Reviewed Living Well with diabetes booklet and encouraged patient to read through entire book. Patient does not have a PCP and will need to establish care with a PCP. Will likely need to be referred to [facility]. Informed patient that he could go to [facility] to get the Reli-On Prime glucometer for $9 and a box of 50 Reli-On test strips for $9 for glucose monitoring at home. Discussed Lantus and Novolog insulin in detail (how to take it, when to take it) and explained how they are currently ordered. Also discussed Metformin and how it works to help with DM control.  Explained that if he is discharged on insulin he will definitely need to monitor glucose and keep a log book of glucose readings and insulin taken. Explained how the doctor he follows up with can use the log book to continue to make insulin adjustments if needed.  Informed patient Diabetes Coordinator will  be back on 10/24/17 to review information discussed today and to talk more about insulin. Asked patient to be sure to read Living Well with Diabetes booklet.  Patient verbalized understanding of information discussed and he states that he has no further questions at this time related to diabetes.   RNs to provide ongoing basic DM education at bedside with this patient and  engage patient to actively check blood glucose and administer insulin injections.  Talked with Meredith Mody, RN regarding conversation with patient and need for patient to establish care with PCP (likely [facility]). Gwen, RN reports that social workers in [employer] usually make appointments for follow up with [facility].    Thanks, Barnie Alderman, RN, MSN, CDE Diabetes Coordinator Inpatient Diabetes Program [phone] (Team Pager from 8am to 5pm)

## 2017-10-23 NOTE — Plan of Care (Signed)
Nutrition Education Note   RD consulted for nutrition education regarding diabetes.   37 y/o male with h/o depression, substance abuse admitted for SI  Lab Results  Component Value Date   HGBA1C 11.6 (H) 10/23/2017    RD provided "Nutrition and Type II Diabetes" handout from the Academy of Nutrition and Dietetics. Discussed different food groups and their effects on blood sugar, emphasizing carbohydrate-containing foods. Provided list of carbohydrates and recommended serving sizes of common foods.  Discussed importance of controlled and consistent carbohydrate intake throughout the day. Provided examples of ways to balance meals/snacks and encouraged intake of high-fiber, whole grain complex carbohydrates. Teach back method used.  Expect poor compliance.  Body mass index is 31.66 kg/m. Pt meets criteria for obesity based on current BMI.  Current diet order is regular, patient is consuming approximately <25% of meals at this time.   Labs and medications reviewed.   Met with pt in room today. Pt not really engaged in education. Per RN, pt does not feel he truly has diabetes. Provided basic DM education. Pt reports that he has not eaten anything since admit. Per chart, pt documented to be eating 25% of meals. RD will order DM friendly snacks; encouraged pt not to eat snacks from day room. RD will provided Premier Protein BID, each supplement provides 160 kcal and 30 grams of protein. Pt drinks Ensure (no strawberry) at home; recommend lower sugar supplements such as Glucerna or Premier.   No further nutrition interventions warranted at this time. RD contact information provided. If additional nutrition issues arise, please re-consult RD.  Koleen Distance MS, RD, LDN Pager #- [phone] After Hours Pager: [phone]

## 2017-10-23 NOTE — BHH Group Notes (Signed)
  LCSW Group Therapy Note  10/23/2017 1:00pm  Type of Therapy/Topic:  Group Therapy:  Emotion Regulation  Participation Level:  Did Not Attend   Description of Group:   The purpose of this group is to assist patients in learning to regulate negative emotions and experience positive emotions. Patients will be guided to discuss ways in which they have been vulnerable to their negative emotions. These vulnerabilities will be juxtaposed with experiences of positive emotions or situations, and patients will be challenged to use positive emotions to combat negative ones. Special emphasis will be placed on coping with negative emotions in conflict situations, and patients will process healthy conflict resolution skills.  Therapeutic Goals: 1. Patient will identify two positive emotions or experiences to reflect on in order to balance out negative emotions 2. Patient will label two or more emotions that they find the most difficult to experience 3. Patient will demonstrate positive conflict resolution skills through discussion and/or role plays  Summary of Patient Progress:       Therapeutic Modalities:   Cognitive Behavioral Therapy Feelings Identification Dialectical Behavioral Therapy   Lisette Mancebo P Rielly Brunn, LCSW 10/23/2017 5:04 PM  

## 2017-10-23 NOTE — Tx Team (Addendum)
Interdisciplinary Treatment and Diagnostic Plan Update  10/23/2017 Time of Session: 10:45am Ronald Hampton MRN: [id number]  Principal Diagnosis: Major depressive disorder, recurrent severe without psychotic features ([place])  Secondary Diagnoses: Principal Problem:   Major depressive disorder, recurrent severe without psychotic features ([place]) Active Problems:   PTSD (post-traumatic stress disorder)   Opioid use disorder, moderate, dependence ([place])   Suicidal ideation   DM (diabetes mellitus), type 2, uncontrolled ([place])   HTN (hypertension)   Current Medications:  Current Facility-Administered Medications  Medication Dose Route Frequency Provider Last Rate Last Dose  . acetaminophen (TYLENOL) tablet 650 mg  650 mg Oral Q6H PRN Pucilowska, Jolanta B, MD      . alum & mag hydroxide-simeth (MAALOX/MYLANTA) 200-200-20 MG/5ML suspension 30 mL  30 mL Oral Q4H PRN Pucilowska, Jolanta B, MD      . buprenorphine-naloxone (SUBOXONE) 2-0.5 mg per SL tablet 1 tablet  1 tablet Sublingual Daily Clapacs, Madie Reno, MD   1 tablet at 10/23/17 0724  . hydrOXYzine (ATARAX/VISTARIL) tablet 50 mg  50 mg Oral TID PRN Pucilowska, Jolanta B, MD      . insulin aspart (novoLOG) injection 0-15 Units  0-15 Units Subcutaneous TID WC Pucilowska, Jolanta B, MD      . insulin aspart (novoLOG) injection 0-5 Units  0-5 Units Subcutaneous QHS Pucilowska, Jolanta B, MD      . insulin glargine (LANTUS) injection 15 Units  15 Units Subcutaneous BH-q7a Pucilowska, Jolanta B, MD   15 Units at 10/23/17 1049  . living well with diabetes book MISC   Does not apply Once Pucilowska, Jolanta B, MD      . magnesium hydroxide (MILK OF MAGNESIA) suspension 30 mL  30 mL Oral Daily PRN Pucilowska, Jolanta B, MD      . metFORMIN (GLUCOPHAGE) tablet 500 mg  500 mg Oral BID WC Clapacs, John T, MD      . traZODone (DESYREL) tablet 100 mg  100 mg Oral QHS Pucilowska, Jolanta B, MD   100 mg at 10/22/17 2147   PTA Medications: Medications  Prior to Admission  Medication Sig Dispense Refill Last Dose  . ibuprofen (ADVIL,MOTRIN) 200 MG tablet Take 200 mg by mouth every 6 (six) hours as needed for headache.   10/21/2017 at Unknown time    Patient Stressors: Financial difficulties Health problems Medication change or noncompliance Substance abuse  Patient Strengths: Average or above average intelligence Capable of independent living Communication skills General fund of knowledge Motivation for treatment/growth  Treatment Modalities: Medication Management, Group therapy, Case management,  1 to 1 session with clinician, Psychoeducation, Recreational therapy.   Physician Treatment Plan for Primary Diagnosis: Major depressive disorder, recurrent severe without psychotic features ([place]) Long Term Goal(s):     Short Term Goals:    Medication Management: Evaluate patient's response, side effects, and tolerance of medication regimen.  Therapeutic Interventions: 1 to 1 sessions, Unit Group sessions and Medication administration.  Evaluation of Outcomes: Progressing  Physician Treatment Plan for Secondary Diagnosis: Principal Problem:   Major depressive disorder, recurrent severe without psychotic features ([place]) Active Problems:   PTSD (post-traumatic stress disorder)   Opioid use disorder, moderate, dependence ([place])   Suicidal ideation   DM (diabetes mellitus), type 2, uncontrolled ([place])   HTN (hypertension)  Long Term Goal(s):     Short Term Goals:       Medication Management: Evaluate patient's response, side effects, and tolerance of medication regimen.  Therapeutic Interventions: 1 to 1 sessions, Unit Group sessions and Medication administration.  Evaluation of Outcomes: Progressing   RN Treatment Plan for Primary Diagnosis: Major depressive disorder, recurrent severe without psychotic features ([place]) Long Term Goal(s): Knowledge of disease and therapeutic regimen to maintain health will improve  Short Term  Goals: Ability to verbalize feelings will improve, Ability to identify and develop effective coping behaviors will improve and Compliance with prescribed medications will improve  Medication Management: RN will administer medications as ordered by provider, will assess and evaluate patient's response and provide education to patient for prescribed medication. RN will report any adverse and/or side effects to prescribing provider.  Therapeutic Interventions: 1 on 1 counseling sessions, Psychoeducation, Medication administration, Evaluate responses to treatment, Monitor vital signs and CBGs as ordered, Perform/monitor CIWA, COWS, AIMS and Fall Risk screenings as ordered, Perform wound care treatments as ordered.  Evaluation of Outcomes: Progressing   LCSW Treatment Plan for Primary Diagnosis: Major depressive disorder, recurrent severe without psychotic features ([place]) Long Term Goal(s): Safe transition to appropriate next level of care at discharge, Engage patient in therapeutic group addressing interpersonal concerns.  Short Term Goals: Engage patient in aftercare planning with referrals and resources, Increase social support, Facilitate patient progression through stages of change regarding substance use diagnoses and concerns, Identify triggers associated with mental health/substance abuse issues and Increase skills for wellness and recovery  Therapeutic Interventions: Assess for all discharge needs, 1 to 1 time with Social worker, Explore available resources and support systems, Assess for adequacy in community support network, Educate family and significant other(s) on suicide prevention, Complete Psychosocial Assessment, Interpersonal group therapy.  Evaluation of Outcomes: Progressing   Progress in Treatment: Attending groups: No. Participating in groups: No. Taking medication as prescribed: Yes. Toleration medication: Yes. Family/Significant other contact made: No, will contact:   Patient refused Patient understands diagnosis: Yes. Discussing patient identified problems/goals with staff: Yes. Medical problems stabilized or resolved: Yes. Denies suicidal/homicidal ideation: Yes. Issues/concerns per patient self-inventory: No. Other:   New problem(s) identified: No, Describe:  None  New Short Term/Long Term Goal(s): "To get back on my medications and go to outpatient treatment."  Discharge Plan or Barriers: To return home and follow up with [facility] for outpatient treatment.  Reason for Continuation of Hospitalization: Depression Medication stabilization  Estimated Length of Stay: 3-5days  Recreational Therapy: Patient Stressors: Death(Son died from SIDS anniversary, recent miscarriage) Patient Goal:  Patient will identify 3 positive coping skills strategies to use for depression post d/c within 5 recreation therapy group sessions  Attendees: Patient: Ronald Hampton 10/23/2017 11:26 AM  Physician: Dr. Bary Leriche, MD 10/23/2017 11:26 AM  Nursing: Kennon Holter, RN 10/23/2017 11:26 AM  RN Care Manager: 10/23/2017 11:26 AM  Social Worker: Darin Engels, [place] 10/23/2017 11:26 AM  Recreational Therapist:  10/23/2017 11:26 AM  Other: Alden Hipp, LCSW 10/23/2017 11:26 AM  Other: Dossie Arbour, LCSW  10/23/2017 11:26 AM  Other: 10/23/2017 11:26 AM    Scribe for Treatment Team: Darin Engels, LCSW 10/23/2017 11:26 AM

## 2017-10-23 NOTE — Progress Notes (Signed)
Working on Radiographer, therapeutic . Denies suicidal  ideation . Conversation with patient  noted positive . Aware of disease concept .  Voice underderstanding of withdrawal ,voice no concerns  With understanding of medication  Received and  And understood  Information  On medication  voice of this being hi 1st time experiencing  diabetics   Patient able to meet with [facility] Diabetic Coordinator. Patient also met with the Dietician   about better choices  with meals . Affect flat . Limited interaction with his  Peers . Appetite poor this breakfast and lunch   . Patient also experiencing  Withdrawal stomach  Cramping  and diarrhea Symptoms from  heroin   A: Encourage patient participation with unit programming . Instruction  Given on  Medication , verbalize understanding.  R: Voice no other concerns. Staff continue to monitor
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
## 2017-10-24 LAB — GLUCOSE, CAPILLARY
Glucose-Capillary: 287 mg/dL — ABNORMAL HIGH (ref 65–99)
Glucose-Capillary: 337 mg/dL — ABNORMAL HIGH (ref 65–99)
Glucose-Capillary: 226 mg/dL — ABNORMAL HIGH (ref 65–99)
Glucose-Capillary: 180 mg/dL — ABNORMAL HIGH (ref 65–99)

## 2017-10-24 MED ORDER — INSULIN STARTER KIT- SYRINGES (ENGLISH)
1.0000 | Freq: Once | Status: AC
  Administered 2017-10-24: 1
  Filled 2017-10-24: qty 1

## 2017-10-24 MED ORDER — ARIPIPRAZOLE 10 MG PO TABS
10.0000 mg | ORAL_TABLET | Freq: Every day | ORAL | Status: DC
  Administered 2017-10-25 – 2017-10-27 (×3): 10 mg via ORAL
  Filled 2017-10-24 (×3): qty 1

## 2017-10-24 MED ORDER — IBUPROFEN 600 MG PO TABS
600.0000 mg | ORAL_TABLET | Freq: Four times a day (QID) | ORAL | Status: DC | PRN
  Administered 2017-10-24 – 2017-10-28 (×7): 600 mg via ORAL
  Filled 2017-10-24 (×8): qty 1

## 2017-10-24 MED ORDER — INSULIN GLARGINE 100 UNIT/ML ~~LOC~~ SOLN
10.0000 [IU] | Freq: Once | SUBCUTANEOUS | Status: AC
  Administered 2017-10-24: 10 [IU] via SUBCUTANEOUS
  Filled 2017-10-24: qty 0.1

## 2017-10-24 MED ORDER — INSULIN GLARGINE 100 UNIT/ML ~~LOC~~ SOLN
25.0000 [IU] | SUBCUTANEOUS | Status: DC
  Administered 2017-10-25 – 2017-10-27 (×3): 25 [IU] via SUBCUTANEOUS
  Filled 2017-10-24 (×4): qty 0.25

## 2017-10-24 MED ORDER — FLUVOXAMINE MALEATE 50 MG PO TABS
50.0000 mg | ORAL_TABLET | Freq: Every day | ORAL | Status: DC
Start: 2017-10-24 — End: 2017-10-25
  Administered 2017-10-24: 50 mg via ORAL
  Filled 2017-10-24: qty 1

## 2017-10-24 MED ORDER — INSULIN ASPART 100 UNIT/ML ~~LOC~~ SOLN
5.0000 [IU] | Freq: Three times a day (TID) | SUBCUTANEOUS | Status: DC
  Administered 2017-10-24 – 2017-10-27 (×11): 5 [IU] via SUBCUTANEOUS
  Filled 2017-10-24 (×4): qty 1

## 2017-10-24 MED ORDER — ARIPIPRAZOLE ER 400 MG IM SRER
400.0000 mg | INTRAMUSCULAR | Status: DC
  Administered 2017-10-24: 400 mg via INTRAMUSCULAR
  Filled 2017-10-24: qty 2

## 2017-10-24 MED ORDER — LISINOPRIL 20 MG PO TABS
10.0000 mg | ORAL_TABLET | Freq: Every day | ORAL | Status: DC
  Administered 2017-10-24 – 2017-10-28 (×5): 10 mg via ORAL
  Filled 2017-10-24 (×5): qty 1

## 2017-10-24 NOTE — Plan of Care (Signed)
Received and  And understood  Information  On medication  Working on coping skills . Denies suicidal  ideation . Conversation with patient  noted positive . Aware of disease concept .  Voice underderstanding of withdrawal ,voice no concerns  With understanding of medication   Problem: Education: Goal: Ability to make informed decisions regarding treatment will improve Outcome: Progressing   Problem: Coping: Goal: Coping ability will improve Outcome: Progressing   Problem: Self-Concept: Goal: Ability to disclose and discuss suicidal ideas will improve Outcome: Progressing Goal: Will verbalize positive feelings about self Outcome: Progressing   Problem: Education: Goal: Knowledge of disease or condition will improve Outcome: Progressing Goal: Understanding of discharge needs will improve Outcome: Progressing   Problem: Physical Regulation: Goal: Complications related to the disease process, condition or treatment will be avoided or minimized Outcome: Progressing   Problem: Self-Concept: Goal: Will verbalize positive feelings about self Outcome: Progressing   Problem: Education: Goal: Utilization of techniques to improve thought processes will improve Outcome: Progressing Goal: Knowledge of the prescribed therapeutic regimen will improve Outcome: Progressing   Problem: Health Behavior/Discharge Planning: Goal: Ability to make decisions will improve Outcome: Progressing Goal: Compliance with therapeutic regimen will improve Outcome: Progressing

## 2017-10-24 NOTE — BHH Counselor (Signed)
CSW called to schedule an appointment with [facility] medical clinic for the patient. Unfortunately, since the patient resides in [facility] he is ineligible to receive services there. CSW will provide the patient with an application for [facility] in [facility] for the patient to receive medical services for his diabetes management. Once the patient is discharged he will need to complete the application and gather necessary information and take it to [facility]. Once they have the application and paperwork, they will schedule an appointment for the patient to be seen.    Darin Engels, MSW, [place], LCASA 10/24/2017 1:55 PM

## 2017-10-24 NOTE — Progress Notes (Addendum)
Inpatient Diabetes Program Recommendations  AACE/ADA: New Consensus Statement on Inpatient Glycemic Control (2015)  Target Ranges:  Prepandial:   less than 140 mg/dL      Peak postprandial:   less than 180 mg/dL (1-2 hours)      Critically ill patients:  140 - 180 mg/dL   Results for Ronald Hampton, Ronald Hampton (MRN [id number]) as of 10/24/2017 07:47  Ref. Range 10/23/2017 07:08 10/23/2017 11:20 10/23/2017 16:21 10/23/2017 21:18 10/24/2017 07:11  Glucose-Capillary Latest Ref Range: 65 - 99 mg/dL 317 (H) 415 (H) 273 (H) 264 (H) 287 (H)   Review of Glycemic Control  Diabetes history: No Outpatient Diabetes medications: NA Current orders for Inpatient glycemic control: Lantus 15 units QAM, Novolog 0-15 units TID with meals, Novolog 0-5 units QHS, Metformin 500 mg BID  Inpatient Diabetes Program Recommendations:  Insulin - Basal: Please consider increasing Lantus to 25 units QAM (starting today). Insulin - Meal Coverage: Please consider ordering Novolog 5 units TID with meals for meal coverage if patient eats at least 50% of meals. HgbA1C: A1C 12.0% on 10/21/17 indicating an average glucose of 298 mg/dl over the past 2-3 months. Patient is being newly dx with DM this admission and bedside nursing will need to be educating patient on DM, CBG monitoring, and insulin while inpatient.  Will plan to see patient again today to review information discussed yesterday and to educate on insulin administration.  Addendum 10/24/17_0 :56-Patient states that he has already been giving himself insulin injections and he reports he feels comfortable with self injecting insulin. Discussed insulin regiment as currently ordered. Discussed Lantus, Novolog insulin and how they are usually given. Explained to patient that since he dose not have any insurance, it would be recommended he be discharged on 70/30 insulin vial since it is more affordable and can be purchased a [facility] for $25 per vial. Discussed 70/30 in more detail and  how it is usually given with breakfast and supper.  Explained and demonstrated proper technique on how to draw up insulin with vial and syringe. Showed patient how to draw up air, inject air into vial, and  draw up insulin into syringe. Patient demonstrated competency with drawing up insulin from vial to syringe. During our conversation, witnessed patient self- inject Lantus 10 units (additional Lantus ordered so patient received total of 25 units today). Provided patient with insulin starter kit (syringes and alcohol wipes were removed and given to [facility], Therapist, sports and she plans to put them in the patient's drawer so he can have them when he is discharged. Reviewed information discussed yesterday and patient was able to answer questions correctly and recalled information we discussed yesterday. Patient reported he was not a resident of [place] so he was wondering if he would be able to go to the [facility] and [facility]. Informed patient that would be a question for social worker that is following as an inpatient and informed patient I would make Meredith Mody, RN aware so she can address with Education officer, museum. Patient verbalized understanding of information discussed and he reports that he has no further questions at this time regarding DM or insulin.  Discussed patient conversation with Meredith Mody and she called Education officer, museum who reported that she was already aware that he was not a resident of [place] and has found adequate resources in [place] where he lives. Social Worker reports that patient will be able to get his insulin at the clinic in [facility].  RN to continue working with patient on  insulin injections before discharge and allow patient to administer own insulin injections while inpatient.  At time of discharge, recommend discharging patient on Novolin 70/30 insulin regimen comparable to current insulin orders if glucose is fairly well controlled. Based on current insulin  orders, recommend Novolin 70/30 18 units BID (this dose would provide a total of 25 units for basal and 11 units for meal coverage per day).   Thanks, Barnie Alderman, RN, MSN, CDE Diabetes Coordinator Inpatient Diabetes Program [phone] (Team Pager from 8am to 5pm)

## 2017-10-24 NOTE — [facility] Group Notes (Signed)
10/24/2017  Time: 1PM  Type of Therapy/Topic:  Group Therapy:  Balance in Life  Participation Level:  Active  Description of Group:   This group will address the concept of balance and how it feels and looks when one is unbalanced. Patients will be encouraged to process areas in their lives that are out of balance and identify reasons for remaining unbalanced. Facilitators will guide patients in utilizing problem-solving interventions to address and correct the stressor making their life unbalanced. Understanding and applying boundaries will be explored and addressed for obtaining and maintaining a balanced life. Patients will be encouraged to explore ways to assertively make their unbalanced needs known to significant others in their lives, using other group members and facilitator for support and feedback.  Therapeutic Goals: 1. Patient will identify two or more emotions or situations they have that consume much of in their lives. 2. Patient will identify signs/triggers that life has become out of balance:  3. Patient will identify two ways to set boundaries in order to achieve balance in their lives:  4. Patient will demonstrate ability to communicate their needs through discussion and/or role plays  Summary of Patient Progress: Pt continues to work towards their tx goals but has not yet reached them. Pt was able to appropriately participate in group discussion, and was able to offer support/validation to other group members. Pt reported he is feeling, "I don't know. I'm blah today." Pt reported he would like to devote more attention to his mental health, and stated one area of his life that is going well is, "my love life."   Therapeutic Modalities:   Cognitive Behavioral Therapy Solution-Focused Therapy Assertiveness Training  Alden Hipp, MSW, LCSW Clinical Social Worker 10/24/2017 1:47 PM

## 2017-10-24 NOTE — Progress Notes (Signed)
D:Received and And understood Information On medication Working on Radiographer, therapeutic . Denies suicidal ideation . Conversation with patient noted positive . Aware of disease concept . Voice underderstanding of withdrawal ,voiceno concerns With understanding of medi cation  Patient stated slept good last night .Stated appetite is good and energy level  Is normal. Stated concentration is good . Stated on Depression scale  9, hopeless 9 and anxiety 6 .( low 0-10 high) Denies suicidal  homicidal ideations  .  No auditory hallucinations  No pain concerns . Appropriate ADL'S. Interacting with peers and staff.   A: Encourage patient participation with unit programming . Instruction  Given on  Medication , verbalize understanding.  R: Voice no other concerns. Staff continue to monitor

## 2017-10-24 NOTE — Progress Notes (Signed)
Recreation Therapy Notes  Date: 10/24/2017  Time: 9:30 am   Location: Craft Room   Behavioral response: N/A   Intervention Topic: Self-care   Discussion/Intervention: Patient did not attend group.   Clinical Observations/Feedback:  Patient did not attend group.          Ameilia Rattan 10/24/2017 11:36 AM

## 2017-10-24 NOTE — [facility] Group Notes (Signed)
LCSW Group Therapy Note 10/24/2017 9:00 AM  Type of Therapy and Topic:  Group Therapy:  Setting Goals  Participation Level:  Did Not Attend  Description of Group: In this process group, patients discussed using strengths to work toward goals and address challenges.  Patients identified two positive things about themselves and one goal they were working on.  Patients were given the opportunity to share openly and support each other's plan for self-empowerment.  The group discussed the value of gratitude and were encouraged to have a daily reflection of positive characteristics or circumstances.  Patients were encouraged to identify a plan to utilize their strengths to work on current challenges and goals.  Therapeutic Goals 1. Patient will verbalize personal strengths/positive qualities and relate how these can assist with achieving desired personal goals 2. Patients will verbalize affirmation of peers plans for personal change and goal setting 3. Patients will explore the value of gratitude and positive focus as related to successful achievement of goals 4. Patients will verbalize a plan for regular reinforcement of personal positive qualities and circumstances.  Summary of Patient Progress:  Ronald Hampton was invited to today's group, but chose not to attend.     Therapeutic Modalities Cognitive Behavioral Therapy Motivational Interviewing    Devona Konig, LCSW 10/24/2017 2:00 PM

## 2017-10-24 NOTE — [facility] Group Notes (Signed)
[facility] Group Notes:  (Nursing/MHT/Case Management/Adjunct)  Date:  10/24/2017  Time:  8:55 PM  Type of Therapy:  Group Therapy  Participation Level:  Active  Participation Quality:  Appropriate  Affect:  Appropriate  Cognitive:  Appropriate  Insight:  Good  Engagement in Group:  Engaged  Modes of Intervention:  Support  Summary of Progress/Problems:  Ronald Hampton 10/24/2017, 8:55 PM

## 2017-10-24 NOTE — Progress Notes (Signed)
Recreation Therapy Notes  INPATIENT RECREATION THERAPY ASSESSMENT  Patient Details Name: Ronald Hampton MRN: [id number] DOB: 1981-04-18 Today's Date: 10/24/2017       Information Obtained From: Patient  Able to Participate in Assessment/Interview: Yes  Patient Presentation: Responsive  Reason for Admission (Per Patient): Suicide Attempt  Patient Stressors: Death(Son died from SIDS anniversary, recent miscarriage)  Coping Skills:   Music, Talk  Leisure Interests (2+):  Sports - Golf(Washing cars, Looking at houses)  Frequency of Recreation/Participation: Financial risk analyst Resources:  No  Community Resources:     Current Use:    If no, Barriers?:    Expressed Interest in [place]: No  [employer] of Residence:  Lobbyist   Patient Main Form of Transportation: Musician  Patient Strengths:  Dependable, Trustworthy  Patient Identified Areas of Improvement:  Start using coping skills  Patient Goal for Hospitalization:  Work on Radiographer, therapeutic and get back on medicine.  Current SI (including self-harm):  Yes  Current HI:  Yes(My Grandfather)  Current AVH: No  Staff Intervention Plan: Group Attendance, Collaborate with Interdisciplinary Treatment Team  Consent to Intern Participation: N/A  Leveda Kendrix 10/24/2017, 3:03 PM

## 2017-10-24 NOTE — Plan of Care (Signed)
Patient is isolate to his but feeling better than when he came in, encouraged to attend unit programs , voice no concerns compliance with his prescribed medicines , denies any thoughts of SI/HI and no respiratory distress noted, 15 minute rounding is in progress Problem: Education: Goal: Ability to make informed decisions regarding treatment will improve Outcome: Progressing   Problem: Coping: Goal: Coping ability will improve Outcome: Progressing   Problem: Self-Concept: Goal: Ability to disclose and discuss suicidal ideas will improve Outcome: Progressing Goal: Will verbalize positive feelings about self Outcome: Progressing   Problem: Education: Goal: Knowledge of disease or condition will improve Outcome: Progressing Goal: Understanding of discharge needs will improve Outcome: Progressing   Problem: Physical Regulation: Goal: Complications related to the disease process, condition or treatment will be avoided or minimized Outcome: Progressing   Problem: Self-Concept: Goal: Will verbalize positive feelings about self Outcome: Progressing   Problem: Education: Goal: Utilization of techniques to improve thought processes will improve Outcome: Progressing Goal: Knowledge of the prescribed therapeutic regimen will improve Outcome: Progressing   Problem: Health Behavior/Discharge Planning: Goal: Ability to make decisions will improve Outcome: Progressing Goal: Compliance with therapeutic regimen will improve Outcome: Progressing

## 2017-10-24 NOTE — Plan of Care (Addendum)
Patient found in day room upon my report meeting with girlfriend. Patient is visible and somewhat social throughout the evening. Patient's girlfriend is an Therapist, sports who works at the jail. Requested med list, patient signed release, and it was provided. Patient seen having very pleasant interactions with staff, peers, and visitor. Complain of headache 9/10, requests and is given Motrin with positive results. WD S/Sx  are minimal. CBG 180, no coverage necessary. Reports eating and voiding adequately. Compliant with HS medications and staff direction. Q 15 minute checks maintained. Will continue to monitor throughout the shift. @ 0315, patient awakened by sinus headache 9/10. Patient reports congestion and pain. Given Motrin and Vistaril (for antihistamine component). Patient reported sweating and was advised by Diabetic Coordinator that if he felt sweaty or clammy, he should test his CBG. CBG 218. Will monitor Motrin and Vistaril for efficacy. Patient slept 6.75 hours. No apparent distress. Will endorse care to oncoming shift. CBG 176. Given 25 units Lantus and 3 units coverage.  Problem: Education: Goal: Ability to make informed decisions regarding treatment will improve Outcome: Progressing   Problem: Coping: Goal: Coping ability will improve Outcome: Progressing   Problem: Self-Concept: Goal: Ability to disclose and discuss suicidal ideas will improve Outcome: Progressing   Problem: Education: Goal: Knowledge of disease or condition will improve Outcome: Progressing   Problem: Physical Regulation: Goal: Complications related to the disease process, condition or treatment will be avoided or minimized Outcome: Progressing   Problem: Education: Goal: Knowledge of the prescribed therapeutic regimen will improve Outcome: Progressing   Problem: Health Behavior/Discharge Planning: Goal: Compliance with therapeutic regimen will improve Outcome: Progressing   Problem: Elimination: Goal: Will not  experience complications related to bowel motility Outcome: Progressing

## 2017-10-24 NOTE — Progress Notes (Signed)
[facility] MD Progress Note  10/24/2017 4:44 PM JAISEN WILTROUT  MRN:  [id number]  Subjective:   Mr. Aquilino is still depressed, anxious and suicidal. He worries about his newly diagnosed diabetes and how to take care of it. He has no insurance or income. He slept better with Ambien last night but still experienced nightmares. He tolerated Abilify well and is ready to start Abilify maintena monthly injections. He is getting diabetes teaching here. Still in withdrawal, very poor appetite, unable to tolerate glucerna.  Spoke with his Biomedical scientist, Berenda Morale at [facility] about court date on Monday, 4/15 and emailed him information.  Principal Problem: Bipolar I disorder, current or most recent episode depressed, with psychotic features [facility] Diagnosis:   Patient Active Problem List   Diagnosis Date Noted  . Bipolar I disorder, current or most recent episode depressed, with psychotic features ([place]) [F31.5] 10/22/2017    Priority: High  . DM (diabetes mellitus), type 2, uncontrolled ([place]) [E11.65] 10/23/2017  . HTN (hypertension) [I10] 10/23/2017  . OCD (obsessive compulsive disorder) [F42.9] 10/23/2017  . PTSD (post-traumatic stress disorder) [F43.10] 10/22/2017  . Opioid use disorder, moderate, dependence ([place]) [F11.20] 10/22/2017  . Suicidal ideation [R45.851] 10/22/2017   Total Time spent with patient: 20 minutes  Past Psychiatric History: bipolar disorder  Past Medical History:  Past Medical History:  Diagnosis Date  . Bipolar 1 disorder ([place])   . Depression   . PTSD (post-traumatic stress disorder)     Past Surgical History:  Procedure Laterality Date  . HERNIA REPAIR    . Right Foot Surgery     Family History: History reviewed. No pertinent family history. Family Psychiatric  History: bipolar Social History:  Social History   Substance and Sexual Activity  Alcohol Use Not Currently     Social History   Substance and Sexual Activity  Drug Use Not Currently     Social History   Socioeconomic History  . Marital status: Legally Separated    Spouse name: Not on file  . Number of children: Not on file  . Years of education: Not on file  . Highest education level: Not on file  Occupational History  . Not on file  Social Needs  . Financial resource strain: Not on file  . Food insecurity:    Worry: Not on file    Inability: Not on file  . Transportation needs:    Medical: Not on file    Non-medical: Not on file  Tobacco Use  . Smoking status: Never Smoker  . Smokeless tobacco: Never Used  Substance and Sexual Activity  . Alcohol use: Not Currently  . Drug use: Not Currently  . Sexual activity: Not on file  Lifestyle  . Physical activity:    Days per week: Not on file    Minutes per session: Not on file  . Stress: Not on file  Relationships  . Social connections:    Talks on phone: Not on file    Gets together: Not on file    Attends religious service: Not on file    Active member of club or organization: Not on file    Attends meetings of clubs or organizations: Not on file    Relationship status: Not on file  Other Topics Concern  . Not on file  Social History Narrative  . Not on file   Additional Social History:  Sleep: Fair  Appetite:  Poor  Current Medications: Current Facility-Administered Medications  Medication Dose Route Frequency Provider Last Rate Last Dose  . acetaminophen (TYLENOL) tablet 650 mg  650 mg Oral Q6H PRN ,  B, MD   650 mg at 10/24/17 0521  . alum & mag hydroxide-simeth (MAALOX/MYLANTA) 200-200-20 MG/5ML suspension 30 mL  30 mL Oral Q4H PRN ,  B, MD      . [START ON 10/25/2017] ARIPiprazole (ABILIFY) tablet 10 mg  10 mg Oral Daily ,  B, MD      . ARIPiprazole ER (ABILIFY MAINTENA) injection 400 mg  400 mg Intramuscular Q28 days ,  B, MD      . cloNIDine (CATAPRES) tablet 0.1 mg  0.1 mg Oral BID  ,  B, MD   0.1 mg at 10/24/17 1636  . fluvoxaMINE (LUVOX) tablet 50 mg  50 mg Oral QHS ,  B, MD      . hydrOXYzine (ATARAX/VISTARIL) tablet 50 mg  50 mg Oral TID PRN ,  B, MD      . ibuprofen (ADVIL,MOTRIN) tablet 600 mg  600 mg Oral Q6H PRN ,  B, MD   600 mg at 10/24/17 1223  . insulin aspart (novoLOG) injection 0-15 Units  0-15 Units Subcutaneous TID WC ,  B, MD   5 Units at 10/24/17 1635  . insulin aspart (novoLOG) injection 0-5 Units  0-5 Units Subcutaneous QHS ,  B, MD   3 Units at 10/23/17 2123  . insulin aspart (novoLOG) injection 5 Units  5 Units Subcutaneous TID WC ,  B, MD   5 Units at 10/24/17 1634  . [START ON 10/25/2017] insulin glargine (LANTUS) injection 25 Units  25 Units Subcutaneous BH-[id number] ,  B, MD      . lisinopril (PRINIVIL,ZESTRIL) tablet 10 mg  10 mg Oral Daily ,  B, MD      . magnesium hydroxide (MILK OF MAGNESIA) suspension 30 mL  30 mL Oral Daily PRN ,  B, MD      . metFORMIN (GLUCOPHAGE) tablet 500 mg  500 mg Oral BID WC Clapacs, John T, MD   500 mg at 10/24/17 1636  . multivitamin with minerals tablet 1 tablet  1 tablet Oral Daily ,  B, MD   1 tablet at 10/24/17 0823  . Oxcarbazepine (TRILEPTAL) tablet 300 mg  300 mg Oral BID ,  B, MD   300 mg at 10/24/17 1637  . prazosin (MINIPRESS) capsule 2 mg  2 mg Oral BID ,  B, MD   2 mg at 10/24/17 1637  . promethazine (PHENERGAN) tablet 25 mg  25 mg Oral Q4H PRN ,  B, MD      . protein supplement (PREMIER PROTEIN) liquid  11 oz Oral BID BM ,  B, MD   11 oz at 10/24/17 1442  . zolpidem (AMBIEN) tablet 10 mg  10 mg Oral QHS ,  B, MD   10 mg at 10/23/17 2122    Lab Results:  Results for orders placed or performed during the hospital encounter of 10/22/17 (from the  past 48 hour(s))  Hemoglobin A1c     Status: Abnormal   Collection Time: 10/22/17  7:59 PM  Result Value Ref Range   Hgb A1c MFr Bld 11.6 (H) 4.8 - 5.6 %    Comment: (NOTE) Pre diabetes:          5.7%-6.4% Diabetes:              >  6.4% Glycemic control for   <7.0% adults with diabetes    Mean Plasma Glucose 286.22 mg/dL    Comment: Performed at [facility], [address] [place], [place] [address]  Hemoglobin A1c     Status: Abnormal   Collection Time: 10/23/17  6:29 AM  Result Value Ref Range   Hgb A1c MFr Bld 11.6 (H) 4.8 - 5.6 %    Comment: (NOTE) Pre diabetes:          5.7%-6.4% Diabetes:              >6.4% Glycemic control for   <7.0% adults with diabetes    Mean Plasma Glucose 286.22 mg/dL    Comment: Performed at [facility], [address] [place], [place] [address]  Lipid panel     Status: Abnormal   Collection Time: 10/23/17  6:29 AM  Result Value Ref Range   Cholesterol 176 0 - 200 mg/dL   Triglycerides 193 (H) <150 mg/dL   HDL 36 (L) >40 mg/dL   Total CHOL/HDL Ratio 4.9 RATIO   VLDL 39 0 - 40 mg/dL   LDL Cholesterol 101 (H) 0 - 99 mg/dL    Comment:        Total Cholesterol/HDL:CHD Risk Coronary Heart Disease Risk Table                     Men   Women  1/2 Average Risk   3.4   3.3  Average Risk       5.0   4.4  2 X Average Risk   9.6   7.1  3 X Average Risk  23.4   11.0        Use the calculated Patient Ratio above and the CHD Risk Table to determine the patient's CHD Risk.        ATP III CLASSIFICATION (LDL):  <100     mg/dL   Optimal  100-129  mg/dL   Near or Above                    Optimal  130-159  mg/dL   Borderline  160-189  mg/dL   High  >190     mg/dL   Very High Performed at [facility], [address] [place], [place] [address]   TSH     Status: Abnormal   Collection Time: 10/23/17  6:29 AM  Result Value Ref Range   TSH 0.344 (L) 0.350 - 4.500 uIU/mL    Comment: Performed by a 3rd Generation assay with a  functional sensitivity of <=0.01 uIU/mL. Performed at [facility], [address] [place], [place] [address]   Basic metabolic panel     Status: Abnormal   Collection Time: 10/23/17  6:29 AM  Result Value Ref Range   Sodium 133 (L) 135 - 145 mmol/L   Potassium 3.8 3.5 - 5.1 mmol/L   Chloride 98 (L) 101 - 111 mmol/L   CO2 26 22 - 32 mmol/L   Glucose, Bld 314 (H) 65 - 99 mg/dL   BUN 8 6 - 20 mg/dL   Creatinine, Ser 0.65 0.61 - 1.24 mg/dL   Calcium 10.1 8.9 - 10.3 mg/dL   GFR calc non Af Amer >60 >60 mL/min   GFR calc Af Amer >60 >60 mL/min    Comment: (NOTE) The eGFR has been calculated using the CKD EPI equation. This calculation has not been validated in all clinical situations.   eGFR's persistently <60 mL/min signify possible Chronic Kidney Disease.    Anion gap 9 5 - 15    Comment: Performed at [facility], [place], [place] [address]  T4, free     Status: None   Collection Time: 10/23/17  6:29 AM  Result Value Ref Range   Free T4 1.06 0.61 - 1.12 ng/dL    Comment: (NOTE) Biotin ingestion may interfere with free T4 tests. If the results are inconsistent with the TSH level, previous test results, or the clinical presentation, then consider biotin interference. If needed, order repeat testing after stopping biotin. Performed at [facility], [address] [place], [place] [address]   Glucose, capillary     Status: Abnormal   Collection Time: 10/23/17  7:08 AM  Result Value Ref Range   Glucose-Capillary 317 (H) 65 - 99 mg/dL   Comment 1 Notify RN   Glucose, capillary     Status: Abnormal   Collection Time: 10/23/17 11:20 AM  Result Value Ref Range   Glucose-Capillary 415 (H) 65 - 99 mg/dL   Comment 1 Notify RN    Comment 2 Document in Chart   Glucose, capillary     Status: Abnormal   Collection Time: 10/23/17  4:21 PM  Result Value Ref Range   Glucose-Capillary 273 (H) 65 - 99 mg/dL  Glucose, capillary     Status:  Abnormal   Collection Time: 10/23/17  9:18 PM  Result Value Ref Range   Glucose-Capillary 264 (H) 65 - 99 mg/dL  Glucose, capillary     Status: Abnormal   Collection Time: 10/24/17  7:11 AM  Result Value Ref Range   Glucose-Capillary 287 (H) 65 - 99 mg/dL  Glucose, capillary     Status: Abnormal   Collection Time: 10/24/17 11:47 AM  Result Value Ref Range   Glucose-Capillary 337 (H) 65 - 99 mg/dL  Glucose, capillary     Status: Abnormal   Collection Time: 10/24/17  3:52 PM  Result Value Ref Range   Glucose-Capillary 226 (H) 65 - 99 mg/dL   Comment 1 Notify RN     Blood Alcohol level:  Lab Results  Component Value Date   ETH <10 10/21/2017   ETH  06/24/2010    <5        LOWEST DETECTABLE LIMIT FOR SERUM ALCOHOL IS 5 mg/dL FOR MEDICAL PURPOSES ONLY    Metabolic Disorder Labs: Lab Results  Component Value Date   HGBA1C 11.6 (H) 10/23/2017   MPG 286.22 10/23/2017   MPG 286.22 10/22/2017   No results found for: PROLACTIN Lab Results  Component Value Date   CHOL 176 10/23/2017   TRIG 193 (H) 10/23/2017   HDL 36 (L) 10/23/2017   CHOLHDL 4.9 10/23/2017   VLDL 39 10/23/2017   LDLCALC 101 (H) 10/23/2017    Physical Findings: AIMS: Facial and Oral Movements Muscles of Facial Expression: None, normal Lips and Perioral Area: None, normal Jaw: None, normal Tongue: None, normal,Extremity Movements Upper (arms, wrists, hands, fingers): None, normal Lower (legs, knees, ankles, toes): None, normal, Trunk Movements Neck, shoulders, hips: None, normal, Overall Severity Severity of abnormal movements (highest score from questions above): None, normal Incapacitation due to abnormal movements: None, normal Patient's awareness of abnormal movements (rate only patient's report): No Awareness, Dental Status Current problems with teeth and/or dentures?: No Does patient usually wear dentures?: No  CIWA:  CIWA-Ar Total: 7 COWS:  COWS Total Score: 10  Musculoskeletal: Strength &  Muscle Tone:  within normal limits Gait & Station: normal Patient leans: N/A  Psychiatric Specialty Exam: Physical Exam  Nursing note and vitals reviewed. Psychiatric: His speech is normal. His affect is blunt. He is slowed and withdrawn. Cognition and memory are normal. He expresses impulsivity. He exhibits a depressed mood. He expresses suicidal ideation.    Review of Systems  Neurological: Negative.   Psychiatric/Behavioral: Positive for depression, substance abuse and suicidal ideas. The patient is nervous/anxious.   All other systems reviewed and are negative.   Blood pressure (!) 138/91, pulse (!) 108, temperature 98.7 F (37.1 C), temperature source Oral, resp. rate 20, height 5' 11" (1.803 m), weight 103 kg (227 lb), SpO2 99 %.Body mass index is 31.66 kg/m.  General Appearance: Casual  Eye Contact:  Good  Speech:  Clear and Coherent  Volume:  Normal  Mood:  Depressed and Hopeless  Affect:  Blunt  Thought Process:  Goal Directed and Descriptions of Associations: Intact  Orientation:  Full (Time, Place, and Person)  Thought Content:  WDL  Suicidal Thoughts:  Yes.  with intent/plan  Homicidal Thoughts:  No  Memory:  Immediate;   Fair Recent;   Fair Remote;   Fair  Judgement:  Poor  Insight:  Lacking  Psychomotor Activity:  Psychomotor Retardation  Concentration:  Concentration: Fair and Attention Span: Fair  Recall:  [employer] of Knowledge:  Fair  Language:  Fair  Akathisia:  No  Handed:  Right  AIMS (if indicated):     Assets:  Communication Skills Desire for Improvement Housing Physical Health Resilience Social Support  ADL's:  Intact  Cognition:  WNL  Sleep:  Number of Hours: 8     Treatment Plan Summary: Daily contact with patient to assess and evaluate symptoms and progress in treatment and Medication management   Mr. Beckers is a 37 year old male with a history of depression, anxiety, mood instability and opioid dependence who was admitted for  suicidal ideation with  plan in the context of severe social stressors and substance use. He wrote a suicide letter two weeks earlier with aplan to kill himself once his wife was out of town. His letter was discovered. While in the ER, he was newly diagnosed with diabetes. There is little improvement doday. He is still depressed, anxious and suicidal.  #Suicidal ideation, passing suicidal thoughts -patient is able to contract for safety in the hospital  #Mood, not improving -continue Trileptal 300 mg BID for mood stabilization -increased Abilify to 10 mg daily -start Abilify maintena 400 mg monthly injections to improve compliance  #Anxiety of OCD and PTSD type, no improvement  -continue Minipress 2 mg BID for nightmares and flashbacks -start Luvox 50 mg nightly for OCD -Vistaril 50 mg TID PRN  #Opioid dependence, patient in withdrawals, improved still stomach cramps, unable to eat -continue Subutex taper -phenergan for nausea/vomiting  #Insomnia, improved on current regimen -start Ambien 10 mg nightly  #Substance abuse treatment -opioids and alcohol -desires residential treatment  #DM, BS still elevated -continue Metformin 500 mg BID -increase lantus to 25 units daily -ADA diet, SSI, CBG, diabetes teaching -start Novolog 4 units TID -input from [facility] is appreciated -poor appetite due to withdrawals, hold SSI if >50% of meal consumed  #HTN, new problem -now on Minioress -start Lisinoopril 10 mg daily  #Metabolic syndrome monitoring -lipid panel, TSH and HgbA1C are pending -EKG pending  #Social -legal charges pending with court on Monday -send a letter to his PO  #Disposition -discharge to home with family -  follow up with [facility] for medication management and [facility] for insulin access    Orson Slick, MD 10/24/2017, 4:44 PM

## 2017-10-25 ENCOUNTER — Other Ambulatory Visit: Payer: Self-pay

## 2017-10-25 DIAGNOSIS — Z5181 Encounter for therapeutic drug level monitoring: Secondary | ICD-10-CM

## 2017-10-25 LAB — GLUCOSE, CAPILLARY
Glucose-Capillary: 218 mg/dL — ABNORMAL HIGH (ref 65–99)
Glucose-Capillary: 176 mg/dL — ABNORMAL HIGH (ref 65–99)
Glucose-Capillary: 131 mg/dL — ABNORMAL HIGH (ref 65–99)
Glucose-Capillary: 128 mg/dL — ABNORMAL HIGH (ref 65–99)
Glucose-Capillary: 124 mg/dL — ABNORMAL HIGH (ref 65–99)
Glucose-Capillary: 196 mg/dL — ABNORMAL HIGH (ref 65–99)

## 2017-10-25 MED ORDER — PROMETHAZINE HCL 25 MG/ML IJ SOLN
25.0000 mg | INTRAMUSCULAR | Status: AC
  Administered 2017-10-25: 25 mg via INTRAMUSCULAR
  Filled 2017-10-25: qty 1

## 2017-10-25 MED ORDER — FLUVOXAMINE MALEATE 50 MG PO TABS
100.0000 mg | ORAL_TABLET | Freq: Every day | ORAL | Status: DC
  Administered 2017-10-25: 100 mg via ORAL
  Filled 2017-10-25 (×2): qty 2

## 2017-10-25 MED ORDER — FLUVOXAMINE MALEATE 50 MG PO TABS
100.0000 mg | ORAL_TABLET | Freq: Once | ORAL | Status: AC
  Administered 2017-10-25: 100 mg via ORAL

## 2017-10-25 MED ORDER — PROMETHAZINE HCL 25 MG/ML IJ SOLN: 25.0000 mg | Freq: Four times a day (QID) | INTRAMUSCULAR | Status: DC | PRN

## 2017-10-25 MED ORDER — PRAZOSIN HCL 2 MG PO CAPS
2.0000 mg | ORAL_CAPSULE | Freq: Once | ORAL | Status: AC
  Administered 2017-10-25: 2 mg via ORAL
  Filled 2017-10-25: qty 1

## 2017-10-25 MED ORDER — ZOLPIDEM TARTRATE 5 MG PO TABS
10.0000 mg | ORAL_TABLET | Freq: Once | ORAL | Status: AC
  Administered 2017-10-25: 10 mg via ORAL
  Filled 2017-10-25: qty 2

## 2017-10-25 MED ORDER — PRAZOSIN HCL 2 MG PO CAPS
2.0000 mg | ORAL_CAPSULE | Freq: Two times a day (BID) | ORAL | Status: DC
  Administered 2017-10-25 – 2017-10-27 (×5): 2 mg via ORAL
  Filled 2017-10-25 (×5): qty 1

## 2017-10-25 MED ORDER — LOPERAMIDE HCL 2 MG PO CAPS
2.0000 mg | ORAL_CAPSULE | ORAL | Status: DC | PRN
  Administered 2017-10-25: 2 mg via ORAL
  Filled 2017-10-25: qty 1

## 2017-10-25 NOTE — Progress Notes (Signed)
Recreation Therapy Notes   Date: 10/25/2017  Time: 9:30 am   Location: Craft Room   Behavioral response: N/A   Intervention Topic: Stress   Discussion/Intervention: Patient did not attend group.   Clinical Observations/Feedback:  Patient did not attend group.   Persephanie Laatsch LRT/CTRS        Tavian Callander 10/25/2017 12:12 PM

## 2017-10-25 NOTE — Progress Notes (Signed)
[facility] talked with patient briefly while on unit. Register listened and provided some places to try in [facility] to seek follow-up services for problems that he is currently experiencing.

## 2017-10-25 NOTE — Progress Notes (Signed)
[facility] MD Progress Note  10/25/2017 2:09 PM Ronald Hampton  MRN:  [id number]  Subjective:  Ronald Hampton has a history of substance abuse, PTSD and bipolar admitted for suicidal ideation. He wrote suicide note wanting to kill himself when his wife went out of town last week. He was started on Abilify, Trileptal and Luvox for depression, anxiety and mood stabilization. Working with diabetes nurse coordinators on treatment of newly discovered diabetes, HgbA1C 11.8. It is stable now on insulin. Wants residential substance abuse treatment.  Ronald Hampton reports improvement. He slept well but is not too sleepy from Luvox. No side effects from Abilify. He still has symptoms of opioid withdrawal in spite of brief course of Subutex. Eats very little, unable to tolerate glucerna. Complains of stomach cramps and diarrhea today. He is no longer suicidal. Still very anxious about discharge.    Principal Problem: Bipolar I disorder, current or most recent episode depressed, with psychotic features [facility]) Diagnosis:   Patient Active Problem List   Diagnosis Date Noted  . Bipolar I disorder, current or most recent episode depressed, with psychotic features ([place]) [F31.5] 10/22/2017    Priority: High  . DM (diabetes mellitus), type 2, uncontrolled ([place]) [E11.65] 10/23/2017  . HTN (hypertension) [I10] 10/23/2017  . OCD (obsessive compulsive disorder) [F42.9] 10/23/2017  . PTSD (post-traumatic stress disorder) [F43.10] 10/22/2017  . Opioid use disorder, moderate, dependence ([place]) [F11.20] 10/22/2017  . Suicidal ideation [R45.851] 10/22/2017   Total Time spent with patient: 20 minutes  Past Psychiatric History: bipolar, substance abuse  Past Medical History:  Past Medical History:  Diagnosis Date  . Bipolar 1 disorder ([place])   . Depression   . PTSD (post-traumatic stress disorder)     Past Surgical History:  Procedure Laterality Date  . HERNIA REPAIR    . Right Foot Surgery     Family History:  History reviewed. No pertinent family history. Family Psychiatric  History: mother with bipolar Social History:  Social History   Substance and Sexual Activity  Alcohol Use Not Currently     Social History   Substance and Sexual Activity  Drug Use Not Currently    Social History   Socioeconomic History  . Marital status: Legally Separated    Spouse name: Not on file  . Number of children: Not on file  . Years of education: Not on file  . Highest education level: Not on file  Occupational History  . Not on file  Social Needs  . Financial resource strain: Not on file  . Food insecurity:    Worry: Not on file    Inability: Not on file  . Transportation needs:    Medical: Not on file    Non-medical: Not on file  Tobacco Use  . Smoking status: Never Smoker  . Smokeless tobacco: Never Used  Substance and Sexual Activity  . Alcohol use: Not Currently  . Drug use: Not Currently  . Sexual activity: Not on file  Lifestyle  . Physical activity:    Days per week: Not on file    Minutes per session: Not on file  . Stress: Not on file  Relationships  . Social connections:    Talks on phone: Not on file    Gets together: Not on file    Attends religious service: Not on file    Active member of club or organization: Not on file    Attends meetings of clubs or organizations: Not on file    Relationship status: Not on  file  Other Topics Concern  . Not on file  Social History Narrative  . Not on file   Additional Social History:                         Sleep: Fair  Appetite:  Poor  Current Medications: Current Facility-Administered Medications  Medication Dose Route Frequency Provider Last Rate Last Dose  . acetaminophen (TYLENOL) tablet 650 mg  650 mg Oral Q6H PRN Mordecai Tindol B, MD   650 mg at 10/24/17 0521  . alum & mag hydroxide-simeth (MAALOX/MYLANTA) 200-200-20 MG/5ML suspension 30 mL  30 mL Oral Q4H PRN Simra Fiebig B, MD      .  ARIPiprazole (ABILIFY) tablet 10 mg  10 mg Oral Daily Field Staniszewski B, MD   10 mg at 10/25/17 0820  . ARIPiprazole ER (ABILIFY MAINTENA) injection 400 mg  400 mg Intramuscular Q28 days Demetrius Barrell B, MD   400 mg at 10/24/17 1649  . cloNIDine (CATAPRES) tablet 0.1 mg  0.1 mg Oral BID Nasrin Lanzo B, MD   0.1 mg at 10/25/17 0820  . fluvoxaMINE (LUVOX) tablet 100 mg  100 mg Oral QHS Aubrianne Molyneux B, MD      . hydrOXYzine (ATARAX/VISTARIL) tablet 50 mg  50 mg Oral TID PRN Nyree Applegate B, MD   50 mg at 10/25/17 0317  . ibuprofen (ADVIL,MOTRIN) tablet 600 mg  600 mg Oral Q6H PRN Emilian Stawicki B, MD   600 mg at 10/25/17 0317  . insulin aspart (novoLOG) injection 0-15 Units  0-15 Units Subcutaneous TID WC Miya Luviano B, MD   2 Units at 10/25/17 1152  . insulin aspart (novoLOG) injection 0-5 Units  0-5 Units Subcutaneous QHS Jalaysia Lobb B, MD   Stopped at 10/24/17 2112  . insulin aspart (novoLOG) injection 5 Units  5 Units Subcutaneous TID WC Jordynn Marcella B, MD   5 Units at 10/25/17 1154  . insulin glargine (LANTUS) injection 25 Units  25 Units Subcutaneous Leane Para B, MD   25 Units at 10/25/17 0709  . lisinopril (PRINIVIL,ZESTRIL) tablet 10 mg  10 mg Oral Daily Tatsuo Musial B, MD   10 mg at 10/25/17 0820  . loperamide (IMODIUM) capsule 2 mg  2 mg Oral PRN Marsheila Alejo B, MD      . magnesium hydroxide (MILK OF MAGNESIA) suspension 30 mL  30 mL Oral Daily PRN Lucy Woolever B, MD      . metFORMIN (GLUCOPHAGE) tablet 500 mg  500 mg Oral BID WC Clapacs, Madie Reno, MD   500 mg at 10/25/17 0820  . multivitamin with minerals tablet 1 tablet  1 tablet Oral Daily Liyat Faulkenberry B, MD   1 tablet at 10/25/17 0819  . Oxcarbazepine (TRILEPTAL) tablet 300 mg  300 mg Oral BID Hilda Rynders B, MD   300 mg at 10/25/17 0820  . prazosin (MINIPRESS) capsule 2 mg  2 mg Oral BID AC & HS Haile Bosler B, MD      .  promethazine (PHENERGAN) tablet 25 mg  25 mg Oral Q4H PRN Zayvon Alicea B, MD      . protein supplement (PREMIER PROTEIN) liquid  11 oz Oral BID BM Mi Balla B, MD   11 oz at 10/25/17 1401  . zolpidem (AMBIEN) tablet 10 mg  10 mg Oral QHS Hetvi Shawhan B, MD   10 mg at 10/24/17 2059    Lab Results:  Results for orders placed or performed  during the hospital encounter of 10/22/17 (from the past 48 hour(s))  Glucose, capillary     Status: Abnormal   Collection Time: 10/23/17  4:21 PM  Result Value Ref Range   Glucose-Capillary 273 (H) 65 - 99 mg/dL  Glucose, capillary     Status: Abnormal   Collection Time: 10/23/17  9:18 PM  Result Value Ref Range   Glucose-Capillary 264 (H) 65 - 99 mg/dL  Glucose, capillary     Status: Abnormal   Collection Time: 10/24/17  7:11 AM  Result Value Ref Range   Glucose-Capillary 287 (H) 65 - 99 mg/dL  Glucose, capillary     Status: Abnormal   Collection Time: 10/24/17 11:47 AM  Result Value Ref Range   Glucose-Capillary 337 (H) 65 - 99 mg/dL  Glucose, capillary     Status: Abnormal   Collection Time: 10/24/17  3:52 PM  Result Value Ref Range   Glucose-Capillary 226 (H) 65 - 99 mg/dL   Comment 1 Notify RN   Glucose, capillary     Status: Abnormal   Collection Time: 10/24/17  8:58 PM  Result Value Ref Range   Glucose-Capillary 180 (H) 65 - 99 mg/dL  Glucose, capillary     Status: Abnormal   Collection Time: 10/25/17  3:15 AM  Result Value Ref Range   Glucose-Capillary 218 (H) 65 - 99 mg/dL  Glucose, capillary     Status: Abnormal   Collection Time: 10/25/17  7:06 AM  Result Value Ref Range   Glucose-Capillary 176 (H) 65 - 99 mg/dL  Glucose, capillary     Status: Abnormal   Collection Time: 10/25/17 11:38 AM  Result Value Ref Range   Glucose-Capillary 131 (H) 65 - 99 mg/dL    Blood Alcohol level:  Lab Results  Component Value Date   ETH <10 10/21/2017   ETH  06/24/2010    <5        LOWEST DETECTABLE LIMIT FOR SERUM  ALCOHOL IS 5 mg/dL FOR MEDICAL PURPOSES ONLY    Metabolic Disorder Labs: Lab Results  Component Value Date   HGBA1C 11.6 (H) 10/23/2017   MPG 286.22 10/23/2017   MPG 286.22 10/22/2017   No results found for: PROLACTIN Lab Results  Component Value Date   CHOL 176 10/23/2017   TRIG 193 (H) 10/23/2017   HDL 36 (L) 10/23/2017   CHOLHDL 4.9 10/23/2017   VLDL 39 10/23/2017   LDLCALC 101 (H) 10/23/2017    Physical Findings: AIMS: Facial and Oral Movements Muscles of Facial Expression: None, normal Lips and Perioral Area: None, normal Jaw: None, normal Tongue: None, normal,Extremity Movements Upper (arms, wrists, hands, fingers): None, normal Lower (legs, knees, ankles, toes): None, normal, Trunk Movements Neck, shoulders, hips: None, normal, Overall Severity Severity of abnormal movements (highest score from questions above): None, normal Incapacitation due to abnormal movements: None, normal Patient's awareness of abnormal movements (rate only patient's report): No Awareness, Dental Status Current problems with teeth and/or dentures?: No Does patient usually wear dentures?: No  CIWA:  CIWA-Ar Total: 7 COWS:  COWS Total Score: 10  Musculoskeletal: Strength & Muscle Tone: within normal limits Gait & Station: normal Patient leans: N/A  Psychiatric Specialty Exam: Physical Exam  Nursing note and vitals reviewed. Psychiatric: His speech is normal. Thought content normal. His mood appears anxious. He is withdrawn. Cognition and memory are normal. He expresses impulsivity. He exhibits a depressed mood.    Review of Systems  Neurological: Negative.   Psychiatric/Behavioral: Positive for depression and substance abuse. The  patient is nervous/anxious.   All other systems reviewed and are negative.   Blood pressure (!) 138/95, pulse 86, temperature 98.7 F (37.1 C), temperature source Oral, resp. rate 18, height 5\' 11"  (1.803 m), weight 103 kg (227 lb), SpO2 99 %.Body mass index  is 31.66 kg/m.  General Appearance: Fairly Groomed  Eye Contact:  Good  Speech:  Clear and Coherent  Volume:  Normal  Mood:  Anxious  Affect:  Flat  Thought Process:  Goal Directed and Descriptions of Associations: Intact  Orientation:  Full (Time, Place, and Person)  Thought Content:  WDL  Suicidal Thoughts:  No  Homicidal Thoughts:  No  Memory:  Immediate;   Fair Recent;   Fair Remote;   Fair  Judgement:  Poor  Insight:  Lacking  Psychomotor Activity:  Psychomotor Retardation  Concentration:  Concentration: Fair and Attention Span: Fair  Recall:  [employer] of Knowledge:  Fair  Language:  Fair  Akathisia:  No  Handed:  Right  AIMS (if indicated):     Assets:  Communication Skills Desire for Improvement Physical Health Resilience Social Support  ADL's:  Intact  Cognition:  WNL  Sleep:  Number of Hours: 5.75     Treatment Plan Summary: Daily contact with patient to assess and evaluate symptoms and progress in treatment and Medication management   Mr. Boruff is a 37 year old male with a history of depression, anxiety, mood instability and opioid dependence who was admitted for suicidal ideation with plan in the context of severe social stressors and substance use. He wrote a suicide letter two weeks earlier with a lan to kill himself once his wife was out of town. His letter was discovered. While in the ER, he was newly diagnosed with diabetes. There is more improvement doday. He is no longer suicidal but still depressed, anxious, hopeless.   #Suicidal ideation, resolved -patient is able to contract for safety in the hospital  #Mood, improved -continue Trileptal 300 mg BID for mood stabilization -increase Abilify to 20 mg daily  -continue Abilify maintena 400 mg monthly injections to improve compliance, next injection on 5/9  #Anxiety of OCD and PTSD type, better -continue Minipress 2 mg BID for nightmares and flashbacks -increase Luvox to 100 mg nightly for  OCD -Vistaril 50 mg TID PRN  #Opioid dependence, patient in withdrawals, unable to eat-diarrhea -completed Subutex taper -phenergan for nausea/vomiting -Loperamin 2 mg PRN for diarrhea  #Insomnia, improved on current regimen -continue Ambien 10 mg nightly  #Substance abuse treatment -opioids and alcohol -desires residential treatment  #DM, BS still elevated -continue Metformin 500 mg BID -continue lantus to 25 units daily -ADA diet, SSI, CBG, diabetes teaching -continue Novolog 4 units TID -input from [facility] is appreciated -poor appetite due to withdrawals, hold SSI if >50% of meal consumed  #HTN, new problem, still elevated -now on Minipress -started Lisinoopril 10 mg daily  #Metabolic syndrome monitoring -lipid panel shows slightly elevated TG, TSH is low with normal T4, HgbA1C 11.6 -EKG pending  #Social -legal charges pending with court on Monday -send a letter to his PO  #Disposition -discharge to home with family -follow up with [facility] for medication management and [facility] for insulin access     Orson Slick, MD 10/25/2017, 2:09 PM

## 2017-10-25 NOTE — [facility] Group Notes (Signed)
10/25/2017 1PM  Type of Therapy and Topic:  Group Therapy:  Feelings around Relapse and Recovery  Participation Level:  Active   Description of Group:    Patients in this group will discuss emotions they experience before and after a relapse. They will process how experiencing these feelings, or avoidance of experiencing them, relates to having a relapse. Facilitator will guide patients to explore emotions they have related to recovery. Patients will be encouraged to process which emotions are more powerful. They will be guided to discuss the emotional reaction significant others in their lives may have to patients' relapse or recovery. Patients will be assisted in exploring ways to respond to the emotions of others without this contributing to a relapse.  Therapeutic Goals: 1. Patient will identify two or more emotions that lead to a relapse for them 2. Patient will identify two emotions that result when they relapse 3. Patient will identify two emotions related to recovery 4. Patient will demonstrate ability to communicate their needs through discussion and/or role plays   Summary of Patient Progress: Actively and appropriately engaged in the group. Patient was able to provide support and validation to other group members.Patient practiced active listening when interacting with the facilitator and other group members Patient in still in the process of obtaining treatment goals. Albaro mentions that before he relapsed he felt "defeated with my life." Patient left group early.    Therapeutic Modalities:   Cognitive Behavioral Therapy Solution-Focused Therapy Assertiveness Training Relapse Prevention Therapy   Darin Engels, [place] 10/25/2017 2:24 PM

## 2017-10-25 NOTE — Plan of Care (Signed)
Patient alert and oriented x 3. Up ad lib with steady gait. Reports that he slept fair with the use sleep aids. Appetite is poor with low energy level. Rates his depression at a 9 and anxiety at a 6. His goal for today is to continue being compliant with his medications and to get diabetes under control. To help reach that goal patient stated that he will get up out of bed, take all of his medications and be compliant with his diet.

## 2017-10-25 NOTE — Progress Notes (Signed)
Inpatient Diabetes Program Recommendations  AACE/ADA: New Consensus Statement on Inpatient Glycemic Control (2015)  Target Ranges:  Prepandial:   less than 140 mg/dL      Peak postprandial:   less than 180 mg/dL (1-2 hours)      Critically ill patients:  140 - 180 mg/dL   Lab Results  Component Value Date   GLUCAP 131 (H) 10/25/2017   HGBA1C 11.6 (H) 10/23/2017    Review of Glycemic ControlResults for SHAQUELLE, HERNON (MRN [id number]) as of 10/25/2017 12:41  Ref. Range 10/24/2017 20:58 10/25/2017 03:15 10/25/2017 07:06 10/25/2017 11:38  Glucose-Capillary Latest Ref Range: 65 - 99 mg/dL 180 (H) 218 (H) 176 (H) 131 (H)    Diabetes history: New onset DM Current orders for Inpatient glycemic control:  Lantus 25 units q AM, Novolog 5 units tid with meals, Novolog moderate tid with meals and HS, Metformin 500 mg bid  Inpatient Diabetes Program Recommendations:  Spoke with RN who stated patient doing ok today.  No DM needs noted.  Upon d/c, note that patient likely will need affordable insulin regimen such as Novolin 70/30 18 units bid (which can be purchased for 25$ a vial at [facility]).   Thanks,  Adah Perl, RN, BC-ADM Inpatient Diabetes Coordinator Pager [phone] (8a-5p)

## 2017-10-26 LAB — GLUCOSE, CAPILLARY
Glucose-Capillary: 196 mg/dL — ABNORMAL HIGH (ref 65–99)
Glucose-Capillary: 123 mg/dL — ABNORMAL HIGH (ref 65–99)
Glucose-Capillary: 146 mg/dL — ABNORMAL HIGH (ref 65–99)
Glucose-Capillary: 102 mg/dL — ABNORMAL HIGH (ref 65–99)

## 2017-10-26 MED ORDER — FLUVOXAMINE MALEATE 50 MG PO TABS
50.0000 mg | ORAL_TABLET | Freq: Every day | ORAL | Status: DC
  Administered 2017-10-26 – 2017-10-27 (×2): 50 mg via ORAL
  Filled 2017-10-26 (×2): qty 1

## 2017-10-26 NOTE — Progress Notes (Signed)
[facility] MD Progress Note  10/26/2017 12:45 PM Ronald Hampton  MRN:  [id number] Subjective:  I am having diarrhea" Pt reportedly reporting nausea, received phenergan yesterday. Reports nausea, diarhoreax3 today. Reports depression, and SI on and off but no plan, contracts for safety. Will order stool panel, and decrease luvox due to nausea/diarhoea.  Principal Problem: Bipolar I disorder, current or most recent episode depressed, with psychotic features [facility]) Diagnosis:   Patient Active Problem List   Diagnosis Date Noted  . DM (diabetes mellitus), type 2, uncontrolled ([place]) [E11.65] 10/23/2017  . HTN (hypertension) [I10] 10/23/2017  . OCD (obsessive compulsive disorder) [F42.9] 10/23/2017  . Bipolar I disorder, current or most recent episode depressed, with psychotic features ([place]) [F31.5] 10/22/2017  . PTSD (post-traumatic stress disorder) [F43.10] 10/22/2017  . Opioid use disorder, moderate, dependence ([place]) [F11.20] 10/22/2017  . Suicidal ideation [R45.851] 10/22/2017   Total Time spent with patient: 30 minutes  Past Psychiatric History: no ne winfo  Past Medical History:  Past Medical History:  Diagnosis Date  . Bipolar 1 disorder ([place])   . Depression   . PTSD (post-traumatic stress disorder)     Past Surgical History:  Procedure Laterality Date  . HERNIA REPAIR    . Right Foot Surgery     Family History: History reviewed. No pertinent family history. Family Psychiatric  History: no ne winfo Social History:  Social History   Substance and Sexual Activity  Alcohol Use Not Currently     Social History   Substance and Sexual Activity  Drug Use Not Currently    Social History   Socioeconomic History  . Marital status: Legally Separated    Spouse name: Not on file  . Number of children: Not on file  . Years of education: Not on file  . Highest education level: Not on file  Occupational History  . Not on file  Social Needs  . Financial resource strain: Not on  file  . Food insecurity:    Worry: Not on file    Inability: Not on file  . Transportation needs:    Medical: Not on file    Non-medical: Not on file  Tobacco Use  . Smoking status: Never Smoker  . Smokeless tobacco: Never Used  Substance and Sexual Activity  . Alcohol use: Not Currently  . Drug use: Not Currently  . Sexual activity: Not on file  Lifestyle  . Physical activity:    Days per week: Not on file    Minutes per session: Not on file  . Stress: Not on file  Relationships  . Social connections:    Talks on phone: Not on file    Gets together: Not on file    Attends religious service: Not on file    Active member of club or organization: Not on file    Attends meetings of clubs or organizations: Not on file    Relationship status: Not on file  Other Topics Concern  . Not on file  Social History Narrative  . Not on file   Additional Social History:                         Sleep: Good  Appetite:  Fair  Current Medications: Current Facility-Administered Medications  Medication Dose Route Frequency Provider Last Rate Last Dose  . acetaminophen (TYLENOL) tablet 650 mg  650 mg Oral Q6H PRN Pucilowska, Jolanta B, MD   650 mg at 10/25/17 2049  . alum &  mag hydroxide-simeth (MAALOX/MYLANTA) 200-200-20 MG/5ML suspension 30 mL  30 mL Oral Q4H PRN Pucilowska, Jolanta B, MD      . ARIPiprazole (ABILIFY) tablet 10 mg  10 mg Oral Daily Pucilowska, Jolanta B, MD   10 mg at 10/26/17 0830  . ARIPiprazole ER (ABILIFY MAINTENA) injection 400 mg  400 mg Intramuscular Q28 days Pucilowska, Jolanta B, MD   400 mg at 10/24/17 1649  . cloNIDine (CATAPRES) tablet 0.1 mg  0.1 mg Oral BID Pucilowska, Jolanta B, MD   0.1 mg at 10/25/17 1646  . fluvoxaMINE (LUVOX) tablet 50 mg  50 mg Oral QHS Lenward Chancellor, MD      . hydrOXYzine (ATARAX/VISTARIL) tablet 50 mg  50 mg Oral TID PRN Pucilowska, Jolanta B, MD   50 mg at 10/26/17 1210  . ibuprofen (ADVIL,MOTRIN) tablet 600 mg  600 mg  Oral Q6H PRN Pucilowska, Jolanta B, MD   600 mg at 10/25/17 1642  . insulin aspart (novoLOG) injection 0-15 Units  0-15 Units Subcutaneous TID WC Pucilowska, Jolanta B, MD   2 Units at 10/26/17 1212  . insulin aspart (novoLOG) injection 0-5 Units  0-5 Units Subcutaneous QHS Pucilowska, Jolanta B, MD   Stopped at 10/24/17 2112  . insulin aspart (novoLOG) injection 5 Units  5 Units Subcutaneous TID WC Pucilowska, Jolanta B, MD   5 Units at 10/26/17 1211  . insulin glargine (LANTUS) injection 25 Units  25 Units Subcutaneous Leane Para B, MD   25 Units at 10/26/17 0706  . lisinopril (PRINIVIL,ZESTRIL) tablet 10 mg  10 mg Oral Daily Pucilowska, Jolanta B, MD   10 mg at 10/26/17 0830  . loperamide (IMODIUM) capsule 2 mg  2 mg Oral PRN Pucilowska, Jolanta B, MD   2 mg at 10/25/17 1410  . magnesium hydroxide (MILK OF MAGNESIA) suspension 30 mL  30 mL Oral Daily PRN Pucilowska, Jolanta B, MD      . metFORMIN (GLUCOPHAGE) tablet 500 mg  500 mg Oral BID WC Clapacs, Madie Reno, MD   500 mg at 10/26/17 0829  . multivitamin with minerals tablet 1 tablet  1 tablet Oral Daily Pucilowska, Jolanta B, MD   1 tablet at 10/26/17 0830  . Oxcarbazepine (TRILEPTAL) tablet 300 mg  300 mg Oral BID Pucilowska, Jolanta B, MD   300 mg at 10/26/17 0829  . prazosin (MINIPRESS) capsule 2 mg  2 mg Oral BID AC & HS Pucilowska, Jolanta B, MD   2 mg at 10/26/17 0831  . promethazine (PHENERGAN) tablet 25 mg  25 mg Oral Q4H PRN Pucilowska, Jolanta B, MD      . protein supplement (PREMIER PROTEIN) liquid  11 oz Oral BID BM Pucilowska, Jolanta B, MD   11 oz at 10/26/17 1007  . zolpidem (AMBIEN) tablet 10 mg  10 mg Oral QHS Pucilowska, Jolanta B, MD   10 mg at 10/25/17 2049    Lab Results:  Results for orders placed or performed during the hospital encounter of 10/22/17 (from the past 48 hour(s))  Glucose, capillary     Status: Abnormal   Collection Time: 10/24/17  3:52 PM  Result Value Ref Range   Glucose-Capillary 226  (H) 65 - 99 mg/dL   Comment 1 Notify RN   Glucose, capillary     Status: Abnormal   Collection Time: 10/24/17  8:58 PM  Result Value Ref Range   Glucose-Capillary 180 (H) 65 - 99 mg/dL  Glucose, capillary     Status: Abnormal   Collection Time:  10/25/17  3:15 AM  Result Value Ref Range   Glucose-Capillary 218 (H) 65 - 99 mg/dL  Glucose, capillary     Status: Abnormal   Collection Time: 10/25/17  7:06 AM  Result Value Ref Range   Glucose-Capillary 176 (H) 65 - 99 mg/dL  Glucose, capillary     Status: Abnormal   Collection Time: 10/25/17 11:38 AM  Result Value Ref Range   Glucose-Capillary 131 (H) 65 - 99 mg/dL  Glucose, capillary     Status: Abnormal   Collection Time: 10/25/17  4:13 PM  Result Value Ref Range   Glucose-Capillary 128 (H) 65 - 99 mg/dL   Comment 1 Notify RN   Glucose, capillary     Status: Abnormal   Collection Time: 10/25/17  8:48 PM  Result Value Ref Range   Glucose-Capillary 124 (H) 65 - 99 mg/dL  Glucose, capillary     Status: Abnormal   Collection Time: 10/25/17  9:51 PM  Result Value Ref Range   Glucose-Capillary 196 (H) 65 - 99 mg/dL  Glucose, capillary     Status: Abnormal   Collection Time: 10/26/17  7:03 AM  Result Value Ref Range   Glucose-Capillary 196 (H) 65 - 99 mg/dL  Glucose, capillary     Status: Abnormal   Collection Time: 10/26/17 11:42 AM  Result Value Ref Range   Glucose-Capillary 123 (H) 65 - 99 mg/dL   Comment 1 Notify RN     Blood Alcohol level:  Lab Results  Component Value Date   ETH <10 10/21/2017   ETH  06/24/2010    <5        LOWEST DETECTABLE LIMIT FOR SERUM ALCOHOL IS 5 mg/dL FOR MEDICAL PURPOSES ONLY    Metabolic Disorder Labs: Lab Results  Component Value Date   HGBA1C 11.6 (H) 10/23/2017   MPG 286.22 10/23/2017   MPG 286.22 10/22/2017   No results found for: PROLACTIN Lab Results  Component Value Date   CHOL 176 10/23/2017   TRIG 193 (H) 10/23/2017   HDL 36 (L) 10/23/2017   CHOLHDL 4.9 10/23/2017    VLDL 39 10/23/2017   LDLCALC 101 (H) 10/23/2017    Physical Findings: AIMS: Facial and Oral Movements Muscles of Facial Expression: None, normal Lips and Perioral Area: None, normal Jaw: None, normal Tongue: None, normal,Extremity Movements Upper (arms, wrists, hands, fingers): None, normal Lower (legs, knees, ankles, toes): None, normal, Trunk Movements Neck, shoulders, hips: None, normal, Overall Severity Severity of abnormal movements (highest score from questions above): None, normal Incapacitation due to abnormal movements: None, normal Patient's awareness of abnormal movements (rate only patient's report): No Awareness, Dental Status Current problems with teeth and/or dentures?: No Does patient usually wear dentures?: No  CIWA:  CIWA-Ar Total: 7 COWS:  COWS Total Score: 3  Musculoskeletal: Strength & Muscle Tone: within normal limits Gait & Station: normal Patient leans:   Psychiatric Specialty Exam: Physical Exam  Nursing note and vitals reviewed.   ROS  Blood pressure 99/66, pulse 84, temperature 98 F (36.7 C), resp. rate 18, height 5\' 11"  (1.803 m), weight 103 kg (227 lb), SpO2 99 %.Body mass index is 31.66 kg/m.  General Appearance: Disheveled  Eye Contact:  Minimal  Speech:  Slow  Volume:  Normal  Mood:  Anxious  Affect:  Constricted  Thought Process:  Goal Directed  Orientation:  ox3  Thought Content:  Logical, physical symptoms  Suicidal Thoughts:  Yes, on and off  Homicidal Thoughts:  No  Memory:  intact  Judgement:  Impaired  Insight:  Fair  Psychomotor Activity:  Decreased  Concentration:  fair  Recall:  Good  Fund of Knowledge:  Good  Language:  Good  Akathisia:  No  Handed:    AIMS (if indicated):     Assets:  Communication Skills  ADL's:  Intact  Cognition:  fair  Sleep:  Number of Hours: 7     Treatment Plan Summary: Daily contact with patient to assess and evaluate symptoms and progress in treatment and Medication management.  Pt  still depressed, and voicing intermittent SI, no plan, contracts for safety. Pt having nausea and diarrhea, will decrease luvox and order GI panel. [id number].  Cont abilify, trileptal.   Lenward Chancellor, MD 10/26/2017, 12:45 PM

## 2017-10-26 NOTE — Plan of Care (Addendum)
Patient found in day room with visitor upon my arrival. Patient is visible and social throughout the evening. Continues to report anxiety and depression but improved. Denies SI/HI/AVH. Complains of HA, given tylenol @2049  along with HS medications. @2100 , patient provided a snack appropriate for diabetic patient. 5 minutes after eating, patient vomited in day room on the floor and in the garbage can. Patient then threw up and was dry heaving in his room into the trash can and toilet. Upon inspection, patient had thrown up Luvox, Zolpidem, and minipress. @2045 , patient CBG 124. After vomiting, CBG 196.  BP 128/89, HR 104. Contacted Dr. Weber Cooks. Phenergan 25mg  IM x 1 ordered and administered to left deltoid. Dr. Weber Cooks contacted for repeat of HS medications. After phenergan took effect, repeat doses ordered and administered @2230 . Patient had some episodes of loose stool during the day as well. Patient stated that he felt fine until right before he vomited. Patient went to bed after 2nd administration and clean up. Will continue to monitor throughout the shift. Q 15 minute checks maintained.  Patient slept 7 hours. No apparent distress. No reports of continued nausea/diarrhea. Will endorse care to oncoming shift. CBG 196, 3 units coverage and 25 units Lantus self-administered. Patient tolerated well.  Problem: Coping: Goal: Coping ability will improve Outcome: Progressing   Problem: Self-Concept: Goal: Ability to disclose and discuss suicidal ideas will improve Outcome: Progressing Goal: Will verbalize positive feelings about self Outcome: Progressing   Problem: Education: Goal: Knowledge of disease or condition will improve Outcome: Progressing Goal: Understanding of discharge needs will improve Outcome: Progressing   Problem: Education: Goal: Knowledge of the prescribed therapeutic regimen will improve Outcome: Progressing   Problem: Health Behavior/Discharge Planning: Goal: Compliance with  therapeutic regimen will improve Outcome: Progressing

## 2017-10-26 NOTE — Plan of Care (Signed)
Patient alert and oriented x 3. Up ad lib with steady gait. Reports that he slept fair with the use sleep aids. Appetite is poor with low energy level. Rates his depression at a 7 and anxiety at a 4. His goal for today is to continue being compliant with his medications and to get diabetes under control. To help reach that goal patient stated that he will get up out of bed, take all of his medications and be compliant with his diet.

## 2017-10-26 NOTE — [facility] Group Notes (Signed)
[facility] Group Notes:  (Nursing/MHT/Case Management/Adjunct)  Date:  10/26/2017  Time:  9:45 PM  Type of Therapy:  Group Therapy  Participation Level:  Active  Participation Quality:  Appropriate  Affect:  Appropriate  Cognitive:  Appropriate  Insight:  Good  Engagement in Group:  Supportive  Modes of Intervention:  Activity  Summary of Progress/Problems:  Ronald Hampton 10/26/2017, 9:45 PM

## 2017-10-26 NOTE — Progress Notes (Signed)
Patient made aware of new enteric precautions ordered by Dr. Alonna Minium due to patient experiencing diarrhea/nausea/vomiting within the past 24 hours. Nuns cap placed in toilet and patient educated on notifying staff of next stool for collection. Will continue to monitor patient.

## 2017-10-26 NOTE — [facility] Group Notes (Signed)
LCSW Group Therapy Note 10/26/2017 1:15pm  Type of Therapy and Topic: Group Therapy: Feelings Around Returning Home & Establishing a Supportive Framework and Supporting Oneself When Supports Not Available  Participation Level: Did Not Attend  Description of Group:  Patients first processed thoughts and feelings about upcoming discharge. These included fears of upcoming changes, lack of change, new living environments, judgements and expectations from others and overall stigma of mental health issues. The group then discussed the definition of a supportive framework, what that looks and feels like, and how do to discern it from an unhealthy non-supportive network. The group identified different types of supports as well as what to do when your family/friends are less than helpful or unavailable  Therapeutic Goals  1. Patient will identify one healthy supportive network that they can use at discharge. 2. Patient will identify one factor of a supportive framework and how to tell it from an unhealthy network. 3. Patient able to identify one coping skill to use when they do not have positive supports from others. 4. Patient will demonstrate ability to communicate their needs through discussion and/or role plays.  Summary of Patient Progress:    Therapeutic Modalities Cognitive Behavioral Therapy Motivational Interviewing   [place], LCSW 10/26/2017 4:03 PM

## 2017-10-26 NOTE — Plan of Care (Addendum)
Patient found in day room interacting with peers upon my arrival. Patient is visible and social throughout the evening. Denies pain. Denies SI/HI/AVH. Mood and affect are brighter. Appearance is improved. Patient is more social. Progressing in all areas. Reports no further loose stools since taking Imodium. Patient has not yet provided a stool sample but states that he feels much better today. Reports some stomach discomfort with Glucophage. CBG 102, no coverage necessary. Compliant with HS medication and staff direction. Q 15 minute checks maintained. CBG 165, 3 units coverage and 25 units Lantus administered. Will continue to monitor throughout the shift. @0130 , patient awakened from sleep with sinus HA 9/10. Given Ibuprofen and Vistaril (antihistamine properties). Will monitor for efficacy. @0600 , complains of continued sinus HA, given Tylenol. Slept 5.25 hours. No apparent distress. Will endorse care to oncoming shift.  Problem: Education: Goal: Ability to make informed decisions regarding treatment will improve Outcome: Progressing   Problem: Coping: Goal: Coping ability will improve Outcome: Progressing   Problem: Self-Concept: Goal: Will verbalize positive feelings about self Outcome: Progressing   Problem: Education: Goal: Knowledge of disease or condition will improve Outcome: Progressing   Problem: Physical Regulation: Goal: Complications related to the disease process, condition or treatment will be avoided or minimized Outcome: Progressing   Problem: Self-Concept: Goal: Will verbalize positive feelings about self Outcome: Progressing   Problem: Education: Goal: Knowledge of the prescribed therapeutic regimen will improve Outcome: Progressing   Problem: Health Behavior/Discharge Planning: Goal: Compliance with therapeutic regimen will improve Outcome: Progressing   Problem: Elimination: Goal: Will not experience complications related to bowel motility Outcome:  Progressing

## 2017-10-27 LAB — GLUCOSE, CAPILLARY
Glucose-Capillary: 165 mg/dL — ABNORMAL HIGH (ref 65–99)
Glucose-Capillary: 131 mg/dL — ABNORMAL HIGH (ref 65–99)
Glucose-Capillary: 117 mg/dL — ABNORMAL HIGH (ref 65–99)
Glucose-Capillary: 136 mg/dL — ABNORMAL HIGH (ref 65–99)

## 2017-10-27 MED ORDER — DOXEPIN HCL 25 MG PO CAPS
25.0000 mg | ORAL_CAPSULE | Freq: Every day | ORAL | Status: DC
  Administered 2017-10-27: 25 mg via ORAL
  Filled 2017-10-27 (×2): qty 1

## 2017-10-27 NOTE — Progress Notes (Signed)
[facility] MD Progress Note  10/27/2017 3:32 PM Ronald Hampton  MRN:  [id number] Subjective:  "I didn't sleep well" Pt lying in bed, reports depression and intermittent SI, last SI this am. Reports poor sleep , sttaes he was given 20mg  ambien in the past. Gi symptoms better.  fs-131.  Principal Problem: Bipolar I disorder, current or most recent episode depressed, with psychotic features [facility]) Diagnosis:   Patient Active Problem List   Diagnosis Date Noted  . DM (diabetes mellitus), type 2, uncontrolled ([place]) [E11.65] 10/23/2017  . HTN (hypertension) [I10] 10/23/2017  . OCD (obsessive compulsive disorder) [F42.9] 10/23/2017  . Bipolar I disorder, current or most recent episode depressed, with psychotic features ([place]) [F31.5] 10/22/2017  . PTSD (post-traumatic stress disorder) [F43.10] 10/22/2017  . Opioid use disorder, moderate, dependence ([place]) [F11.20] 10/22/2017  . Suicidal ideation [R45.851] 10/22/2017   Total Time spent with patient: 30 minutes  Past Psychiatric History: no new information  Past Medical History:  Past Medical History:  Diagnosis Date  . Bipolar 1 disorder ([place])   . Depression   . PTSD (post-traumatic stress disorder)     Past Surgical History:  Procedure Laterality Date  . HERNIA REPAIR    . Right Foot Surgery     Family History: History reviewed. No pertinent family history. Family Psychiatric  History: no ne winfo Social History:  Social History   Substance and Sexual Activity  Alcohol Use Not Currently     Social History   Substance and Sexual Activity  Drug Use Not Currently    Social History   Socioeconomic History  . Marital status: Legally Separated    Spouse name: Not on file  . Number of children: Not on file  . Years of education: Not on file  . Highest education level: Not on file  Occupational History  . Not on file  Social Needs  . Financial resource strain: Not on file  . Food insecurity:    Worry: Not on file    Inability:  Not on file  . Transportation needs:    Medical: Not on file    Non-medical: Not on file  Tobacco Use  . Smoking status: Never Smoker  . Smokeless tobacco: Never Used  Substance and Sexual Activity  . Alcohol use: Not Currently  . Drug use: Not Currently  . Sexual activity: Not on file  Lifestyle  . Physical activity:    Days per week: Not on file    Minutes per session: Not on file  . Stress: Not on file  Relationships  . Social connections:    Talks on phone: Not on file    Gets together: Not on file    Attends religious service: Not on file    Active member of club or organization: Not on file    Attends meetings of clubs or organizations: Not on file    Relationship status: Not on file  Other Topics Concern  . Not on file  Social History Narrative  . Not on file   Additional Social History:                         Sleep: Good  Appetite:  Fair  Current Medications: Current Facility-Administered Medications  Medication Dose Route Frequency Provider Last Rate Last Dose  . acetaminophen (TYLENOL) tablet 650 mg  650 mg Oral Q6H PRN Pucilowska, Jolanta B, MD   650 mg at 10/27/17 0617  . alum & mag hydroxide-simeth (MAALOX/MYLANTA)  200-200-20 MG/5ML suspension 30 mL  30 mL Oral Q4H PRN Pucilowska, Jolanta B, MD      . ARIPiprazole (ABILIFY) tablet 10 mg  10 mg Oral Daily Pucilowska, Jolanta B, MD   10 mg at 10/27/17 0806  . ARIPiprazole ER (ABILIFY MAINTENA) injection 400 mg  400 mg Intramuscular Q28 days Pucilowska, Jolanta B, MD   400 mg at 10/24/17 1649  . cloNIDine (CATAPRES) tablet 0.1 mg  0.1 mg Oral BID Pucilowska, Jolanta B, MD   0.1 mg at 10/27/17 0806  . fluvoxaMINE (LUVOX) tablet 50 mg  50 mg Oral QHS Lenward Chancellor, MD   50 mg at 10/26/17 2032  . hydrOXYzine (ATARAX/VISTARIL) tablet 50 mg  50 mg Oral TID PRN Pucilowska, Jolanta B, MD   50 mg at 10/27/17 0131  . ibuprofen (ADVIL,MOTRIN) tablet 600 mg  600 mg Oral Q6H PRN Pucilowska, Jolanta B, MD    600 mg at 10/27/17 0131  . insulin aspart (novoLOG) injection 0-15 Units  0-15 Units Subcutaneous TID WC Pucilowska, Jolanta B, MD   2 Units at 10/27/17 1121  . insulin aspart (novoLOG) injection 0-5 Units  0-5 Units Subcutaneous QHS Pucilowska, Jolanta B, MD   Stopped at 10/24/17 2112  . insulin aspart (novoLOG) injection 5 Units  5 Units Subcutaneous TID WC Pucilowska, Jolanta B, MD   5 Units at 10/27/17 1121  . insulin glargine (LANTUS) injection 25 Units  25 Units Subcutaneous Leane Para B, MD   25 Units at 10/27/17 0705  . lisinopril (PRINIVIL,ZESTRIL) tablet 10 mg  10 mg Oral Daily Pucilowska, Jolanta B, MD   10 mg at 10/27/17 0806  . loperamide (IMODIUM) capsule 2 mg  2 mg Oral PRN Pucilowska, Jolanta B, MD   2 mg at 10/25/17 1410  . magnesium hydroxide (MILK OF MAGNESIA) suspension 30 mL  30 mL Oral Daily PRN Pucilowska, Jolanta B, MD      . metFORMIN (GLUCOPHAGE) tablet 500 mg  500 mg Oral BID WC Clapacs, Madie Reno, MD   500 mg at 10/27/17 0806  . multivitamin with minerals tablet 1 tablet  1 tablet Oral Daily Pucilowska, Jolanta B, MD   1 tablet at 10/27/17 0806  . Oxcarbazepine (TRILEPTAL) tablet 300 mg  300 mg Oral BID Pucilowska, Jolanta B, MD   300 mg at 10/27/17 0806  . prazosin (MINIPRESS) capsule 2 mg  2 mg Oral BID AC & HS Pucilowska, Jolanta B, MD   2 mg at 10/27/17 0812  . promethazine (PHENERGAN) tablet 25 mg  25 mg Oral Q4H PRN Pucilowska, Jolanta B, MD      . protein supplement (PREMIER PROTEIN) liquid  11 oz Oral BID BM Pucilowska, Jolanta B, MD   11 oz at 10/27/17 1440  . zolpidem (AMBIEN) tablet 10 mg  10 mg Oral QHS Pucilowska, Jolanta B, MD   10 mg at 10/26/17 2032    Lab Results:  Results for orders placed or performed during the hospital encounter of 10/22/17 (from the past 48 hour(s))  Glucose, capillary     Status: Abnormal   Collection Time: 10/25/17  4:13 PM  Result Value Ref Range   Glucose-Capillary 128 (H) 65 - 99 mg/dL   Comment 1 Notify RN    Glucose, capillary     Status: Abnormal   Collection Time: 10/25/17  8:48 PM  Result Value Ref Range   Glucose-Capillary 124 (H) 65 - 99 mg/dL  Glucose, capillary     Status: Abnormal   Collection Time:  10/25/17  9:51 PM  Result Value Ref Range   Glucose-Capillary 196 (H) 65 - 99 mg/dL  Glucose, capillary     Status: Abnormal   Collection Time: 10/26/17  7:03 AM  Result Value Ref Range   Glucose-Capillary 196 (H) 65 - 99 mg/dL  Glucose, capillary     Status: Abnormal   Collection Time: 10/26/17 11:42 AM  Result Value Ref Range   Glucose-Capillary 123 (H) 65 - 99 mg/dL   Comment 1 Notify RN   Glucose, capillary     Status: Abnormal   Collection Time: 10/26/17  4:28 PM  Result Value Ref Range   Glucose-Capillary 146 (H) 65 - 99 mg/dL  Glucose, capillary     Status: Abnormal   Collection Time: 10/26/17  8:31 PM  Result Value Ref Range   Glucose-Capillary 102 (H) 65 - 99 mg/dL  Glucose, capillary     Status: Abnormal   Collection Time: 10/27/17  7:04 AM  Result Value Ref Range   Glucose-Capillary 165 (H) 65 - 99 mg/dL  Glucose, capillary     Status: Abnormal   Collection Time: 10/27/17 11:18 AM  Result Value Ref Range   Glucose-Capillary 131 (H) 65 - 99 mg/dL    Blood Alcohol level:  Lab Results  Component Value Date   ETH <10 10/21/2017   ETH  06/24/2010    <5        LOWEST DETECTABLE LIMIT FOR SERUM ALCOHOL IS 5 mg/dL FOR MEDICAL PURPOSES ONLY    Metabolic Disorder Labs: Lab Results  Component Value Date   HGBA1C 11.6 (H) 10/23/2017   MPG 286.22 10/23/2017   MPG 286.22 10/22/2017   No results found for: PROLACTIN Lab Results  Component Value Date   CHOL 176 10/23/2017   TRIG 193 (H) 10/23/2017   HDL 36 (L) 10/23/2017   CHOLHDL 4.9 10/23/2017   VLDL 39 10/23/2017   LDLCALC 101 (H) 10/23/2017    Physical Findings: AIMS: Facial and Oral Movements Muscles of Facial Expression: None, normal Lips and Perioral Area: None, normal Jaw: None, normal Tongue:  None, normal,Extremity Movements Upper (arms, wrists, hands, fingers): None, normal Lower (legs, knees, ankles, toes): None, normal, Trunk Movements Neck, shoulders, hips: None, normal, Overall Severity Severity of abnormal movements (highest score from questions above): None, normal Incapacitation due to abnormal movements: None, normal Patient's awareness of abnormal movements (rate only patient's report): No Awareness, Dental Status Current problems with teeth and/or dentures?: No Does patient usually wear dentures?: No  CIWA:  CIWA-Ar Total: 7 COWS:  COWS Total Score: 3  Musculoskeletal: Strength & Muscle Tone: within normal limits Gait & Station: normal Patient leans:   Psychiatric Specialty Exam: Physical Exam  Nursing note and vitals reviewed.   ROS  Blood pressure 123/84, pulse (!) 119, temperature 97.8 F (36.6 C), temperature source Oral, resp. rate 16, height 5\' 11"  (1.803 m), weight 103 kg (227 lb), SpO2 100 %.Body mass index is 31.66 kg/m.  General Appearance: Disheveled, age appropriate  Eye Contact:  Minimal  Speech:  Slow  Volume:  Normal  Mood:  Anxious  Affect:  restricted  Thought Process:  Goal Directed  Orientation:  ox3  Thought Content:  Logical,   Suicidal Thoughts:  Yes, no plan  Homicidal Thoughts:  No  Memory:  intact  Judgement:  Impaired  Insight:  Fair  Psychomotor Activity:  Decreased  Concentration:  fair  Recall:  Good  Fund of Knowledge:  Good  Language:  Good  Akathisia:  No  Handed:    AIMS (if indicated):     Assets:  Communication Skills  ADL's:  Intact  Cognition:  fair  Sleep:  Number of Hours: 5.25     Treatment Plan Summary: Daily contact with patient to assess and evaluate symptoms and progress in treatment and Medication management.  Pt still depressed, and voicing  SI, no plan, contracts for safety.  GI panel- pending. fs-131. Poor sleep with 10mg  Ambien.  Cont abilify, trileptal, luvox.  Add doxepin for sleep,  cont ambien.   Lenward Chancellor, MD 10/27/2017, 3:32 PMPatient ID: Rose Fillers, male   DOB: 19-Oct-1980, 37 y.o.   MRN: [id number]

## 2017-10-27 NOTE — Plan of Care (Signed)
  Problem: Coping: Goal: Coping ability will improve Outcome: Progressing   Problem: Self-Concept: Goal: Ability to disclose and discuss suicidal ideas will improve Outcome: Progressing   Problem: Health Behavior/Discharge Planning: Goal: Compliance with therapeutic regimen will improve Outcome: Progressing

## 2017-10-27 NOTE — [facility] Group Notes (Signed)
LCSW Group Therapy Note 10/27/2017 1:00pm Type of Therapy and Topic:  Group Therapy:  Communication Participation Level:  Did Not Attend  Description of Group: Patients will identify how individuals communicate with one another appropriately and inappropriately.  Patients will be guided to discuss their thoughts, feelings and behaviors related to barriers when communicating.  The group will process together ways to execute positive and appropriate communication with attention given to how one uses behavior, tone and body language.  Patients will be encouraged to reflect on a situation where they were successfully able to communicate and what made this example successful.  Group will identify specific changes they are motivated to make in order to overcome communication barriers with self, peers, authority, and parents.  This group will be process-oriented with patients participating in exploration of their own experiences, giving and receiving support, and challenging self and other group members.   Therapeutic Goals 1. Patient will express an understanding of the four types of verbal communication (passive, aggressive, passive aggressive, and assertive).  2. Patient will identify feelings (such as fear or worry), thought process and behaviors related to why people internalize feelings rather than express self openly. 3. Patient will identify their main communication style, and report how they plan to change their style to assertive. 4. Members will then practice through role play how to communicate using I statements, I feel statements, and acknowledging feelings rather than displacing feelings on others Summary of Patient Progress: Pt was invited to attend group but chose not to attend. CSW will continue to encourage pt to attend group throughout their admission.    Therapeutic Modalities Cognitive Behavioral Therapy Motivational Interviewing Solution Focused Therapy  Alden Hipp, MSW,  LCSW Clinical Social Worker 10/27/2017 1:49 PM

## 2017-10-27 NOTE — Plan of Care (Signed)
Patient is seeing in the milieu area seating but not interacting with peers, endorsing depression and anxiety at 6/10 but denies any SI/HI aware of coping skills and safety In the unit no signs of AVH, patient is improving in all areas no respiratory distress noted. 15 minute safety rounds is in progress. Problem: Education: Goal: Ability to make informed decisions regarding treatment will improve Outcome: Progressing   Problem: Coping: Goal: Coping ability will improve Outcome: Progressing   Problem: Self-Concept: Goal: Ability to disclose and discuss suicidal ideas will improve Outcome: Progressing Goal: Will verbalize positive feelings about self Outcome: Progressing   Problem: Education: Goal: Knowledge of disease or condition will improve Outcome: Progressing Goal: Understanding of discharge needs will improve Outcome: Progressing   Problem: Physical Regulation: Goal: Complications related to the disease process, condition or treatment will be avoided or minimized Outcome: Progressing   Problem: Self-Concept: Goal: Will verbalize positive feelings about self Outcome: Progressing   Problem: Education: Goal: Utilization of techniques to improve thought processes will improve Outcome: Progressing Goal: Knowledge of the prescribed therapeutic regimen will improve Outcome: Progressing   Problem: Health Behavior/Discharge Planning: Goal: Ability to make decisions will improve Outcome: Progressing Goal: Compliance with therapeutic regimen will improve Outcome: Progressing   Problem: Elimination: Goal: Will not experience complications related to bowel motility Outcome: Progressing   Problem: Spiritual Needs Goal: Ability to function at adequate level Outcome: Progressing

## 2017-10-27 NOTE — Progress Notes (Signed)
Patient isolative to self most of shift. Interacts with peers at times. Endorses passive SI, with no plan. Denies HI, AVH. Medication compliant. No stool sample provided. No complaints of stomach discomfort.  Encouragement and support offered. Education on diabetes continues. Pt remains safe on unit with q 15 min checks.

## 2017-10-28 LAB — GLUCOSE, CAPILLARY
Glucose-Capillary: 168 mg/dL — ABNORMAL HIGH (ref 65–99)
Glucose-Capillary: 118 mg/dL — ABNORMAL HIGH (ref 65–99)
Glucose-Capillary: 122 mg/dL — ABNORMAL HIGH (ref 65–99)

## 2017-10-28 MED ORDER — FLUVOXAMINE MALEATE 50 MG PO TABS: 100.0000 mg | ORAL_TABLET | Freq: Every day | ORAL | Status: DC

## 2017-10-28 MED ORDER — INSULIN GLARGINE 100 UNIT/ML ~~LOC~~ SOLN: SUBCUTANEOUS | 3 refills | Status: DC

## 2017-10-28 MED ORDER — HYDROXYZINE HCL 50 MG PO TABS: 50.0000 mg | ORAL_TABLET | Freq: Three times a day (TID) | ORAL | 1 refills | Status: DC | PRN

## 2017-10-28 MED ORDER — DOXEPIN HCL 25 MG PO CAPS: 25.0000 mg | ORAL_CAPSULE | Freq: Every day | ORAL | 1 refills | Status: DC

## 2017-10-28 MED ORDER — FLUVOXAMINE MALEATE 100 MG PO TABS: 100.0000 mg | ORAL_TABLET | Freq: Every day | ORAL | 1 refills | Status: DC

## 2017-10-28 MED ORDER — NALOXONE HCL 4 MG/0.1ML NA LIQD: NASAL | 1 refills | Status: DC

## 2017-10-28 MED ORDER — METFORMIN HCL 500 MG PO TABS: 500.0000 mg | ORAL_TABLET | Freq: Two times a day (BID) | ORAL | 1 refills | Status: DC

## 2017-10-28 MED ORDER — PRAZOSIN HCL 2 MG PO CAPS: 2.0000 mg | ORAL_CAPSULE | Freq: Two times a day (BID) | ORAL | 1 refills | Status: DC

## 2017-10-28 MED ORDER — ARIPIPRAZOLE 10 MG PO TABS
20.0000 mg | ORAL_TABLET | Freq: Every day | ORAL | Status: DC
  Administered 2017-10-28: 20 mg via ORAL
  Filled 2017-10-28: qty 2

## 2017-10-28 MED ORDER — BLOOD GLUCOSE MONITOR KIT: PACK | 0 refills | Status: DC

## 2017-10-28 MED ORDER — INSULIN ASPART PROT & ASPART (70-30 MIX) 100 UNIT/ML ~~LOC~~ SUSP: 18.0000 [IU] | Freq: Two times a day (BID) | SUBCUTANEOUS | 11 refills | Status: DC

## 2017-10-28 MED ORDER — INSULIN ASPART PROT & ASPART (70-30 MIX) 100 UNIT/ML ~~LOC~~ SUSP
18.0000 [IU] | Freq: Two times a day (BID) | SUBCUTANEOUS | Status: DC
  Administered 2017-10-28: 18 [IU] via SUBCUTANEOUS
  Filled 2017-10-28: qty 1

## 2017-10-28 MED ORDER — ARIPIPRAZOLE 20 MG PO TABS: 20.0000 mg | ORAL_TABLET | Freq: Every day | ORAL | 1 refills | Status: DC

## 2017-10-28 MED ORDER — OXCARBAZEPINE 300 MG PO TABS: 300.0000 mg | ORAL_TABLET | Freq: Two times a day (BID) | ORAL | 1 refills | Status: DC

## 2017-10-28 MED ORDER — LISINOPRIL 10 MG PO TABS: 10.0000 mg | ORAL_TABLET | Freq: Every day | ORAL | 1 refills | Status: DC

## 2017-10-28 MED ORDER — ARIPIPRAZOLE ER 400 MG IM SRER: 400.0000 mg | INTRAMUSCULAR | 1 refills | Status: DC

## 2017-10-28 MED ORDER — INSULIN ASPART 100 UNIT/ML ~~LOC~~ SOLN: SUBCUTANEOUS | 3 refills | Status: DC

## 2017-10-28 MED ORDER — ZOLPIDEM TARTRATE 10 MG PO TABS: 10.0000 mg | ORAL_TABLET | Freq: Every day | ORAL | 0 refills | Status: DC

## 2017-10-28 NOTE — Progress Notes (Signed)
  [facility] Adult Case Management Discharge Plan :  Will you be returning to the same living situation after discharge:  Yes,  Home At discharge, do you have transportation home?: Yes,  Patients girlfriend will come and pick him up Do you have the ability to pay for your medications: Yes,  Referred to a provider who can assist  Release of information consent forms completed and in the chart;  Patient's signature needed at discharge.  Patient to Follow up at: [place], [facility]. Go on 10/31/2017.   Why:  Please attend your follow up appointment scheduled for Thursday 10/31/2017 at 9:45am. Please bring your ID, SSI card, Insurance card and proof of income. Thank you. Contact information: [place] [address] [phone]        [facility] Follow up.   Why:  Please complete the applications provided to you and gather the needed information and take it to the facility. Once you drop off the papwerwork they will allow you to schedule an appointment. Thank you. Contact information: [place], [address]  Phone: [phone] Fax: [phone]          Next level of care provider has access to [place] and Suicide Prevention discussed: Yes,  Completed with patient. Patient refused family contact  Have you used any form of tobacco in the last 30 days? (Cigarettes, Smokeless Tobacco, Cigars, and/or Pipes): No  Has patient been referred to the Quitline?: N/A patient is not a smoker  Patient has been referred for addiction treatment: N/A  Darin Engels, LCSW 10/28/2017, 2:47 PM

## 2017-10-28 NOTE — Progress Notes (Signed)
[facility] MD Progress Note  10/28/2017 5:00 AM FILMORE MOLYNEUX  MRN:  [id number]  Subjective:   Mr. Ormiston still feels depressed and has passing thoughts of suicide but no plan. He is worried about his court that he is missing today. We send a letter but there still may be a warrant. According to his records, there are multiple charges. He is on probation for one more month. Because of legal status, he may not go to rehab. He is physically better, no more diarrhea, or symptoms of opioid withdrawal.  Principal Problem: Bipolar I disorder, current or most recent episode depressed, with psychotic features ([place]) Diagnosis:   Patient Active Problem List   Diagnosis Date Noted  . Bipolar I disorder, current or most recent episode depressed, with psychotic features ([place]) [F31.5] 10/22/2017    Priority: High  . DM (diabetes mellitus), type 2, uncontrolled ([place]) [E11.65] 10/23/2017  . HTN (hypertension) [I10] 10/23/2017  . OCD (obsessive compulsive disorder) [F42.9] 10/23/2017  . PTSD (post-traumatic stress disorder) [F43.10] 10/22/2017  . Opioid use disorder, moderate, dependence ([place]) [F11.20] 10/22/2017  . Suicidal ideation [R45.851] 10/22/2017   Total Time spent with patient: 20 minutes  Past Psychiatric History: bipolar, substance abuse  Past Medical History:  Past Medical History:  Diagnosis Date  . Bipolar 1 disorder ([place])   . Depression   . PTSD (post-traumatic stress disorder)     Past Surgical History:  Procedure Laterality Date  . HERNIA REPAIR    . Right Foot Surgery     Family History: History reviewed. No pertinent family history. Family Psychiatric  History: bipolar Social History:  Social History   Substance and Sexual Activity  Alcohol Use Not Currently     Social History   Substance and Sexual Activity  Drug Use Not Currently    Social History   Socioeconomic History  . Marital status: Legally Separated    Spouse name: Not on file  . Number of children:  Not on file  . Years of education: Not on file  . Highest education level: Not on file  Occupational History  . Not on file  Social Needs  . Financial resource strain: Not on file  . Food insecurity:    Worry: Not on file    Inability: Not on file  . Transportation needs:    Medical: Not on file    Non-medical: Not on file  Tobacco Use  . Smoking status: Never Smoker  . Smokeless tobacco: Never Used  Substance and Sexual Activity  . Alcohol use: Not Currently  . Drug use: Not Currently  . Sexual activity: Not on file  Lifestyle  . Physical activity:    Days per week: Not on file    Minutes per session: Not on file  . Stress: Not on file  Relationships  . Social connections:    Talks on phone: Not on file    Gets together: Not on file    Attends religious service: Not on file    Active member of club or organization: Not on file    Attends meetings of clubs or organizations: Not on file    Relationship status: Not on file  Other Topics Concern  . Not on file  Social History Narrative  . Not on file   Additional Social History:                         Sleep: Fair  Appetite:  Fair  Current Medications:  Current Facility-Administered Medications  Medication Dose Route Frequency Provider Last Rate Last Dose  . acetaminophen (TYLENOL) tablet 650 mg  650 mg Oral Q6H PRN Unique Searfoss B, MD   650 mg at 10/27/17 0617  . alum & mag hydroxide-simeth (MAALOX/MYLANTA) 200-200-20 MG/5ML suspension 30 mL  30 mL Oral Q4H PRN Emmeline Winebarger B, MD      . ARIPiprazole (ABILIFY) tablet 10 mg  10 mg Oral Daily Giavanni Odonovan B, MD   10 mg at 10/27/17 0806  . ARIPiprazole ER (ABILIFY MAINTENA) injection 400 mg  400 mg Intramuscular Q28 days Mardee Clune B, MD   400 mg at 10/24/17 1649  . cloNIDine (CATAPRES) tablet 0.1 mg  0.1 mg Oral BID Genesee Nase B, MD   0.1 mg at 10/27/17 0806  . doxepin (SINEQUAN) capsule 25 mg  25 mg Oral QHS Lenward Chancellor, MD   25 mg at 10/27/17 2123  . fluvoxaMINE (LUVOX) tablet 100 mg  100 mg Oral QHS Austyn Seier B, MD      . hydrOXYzine (ATARAX/VISTARIL) tablet 50 mg  50 mg Oral TID PRN Sloane Palmer B, MD   50 mg at 10/27/17 2119  . ibuprofen (ADVIL,MOTRIN) tablet 600 mg  600 mg Oral Q6H PRN Wright Gravely B, MD   600 mg at 10/27/17 2119  . insulin aspart (novoLOG) injection 0-15 Units  0-15 Units Subcutaneous TID WC Karstyn Birkey B, MD   5 Units at 10/27/17 1629  . insulin aspart (novoLOG) injection 0-5 Units  0-5 Units Subcutaneous QHS Jaeshaun Riva B, MD   0 Units at 10/27/17 2127  . insulin aspart protamine- aspart (NOVOLOG MIX 70/30) injection 18 Units  18 Units Subcutaneous BID WC Namya Voges B, MD      . lisinopril (PRINIVIL,ZESTRIL) tablet 10 mg  10 mg Oral Daily Jarom Govan B, MD   10 mg at 10/27/17 0806  . loperamide (IMODIUM) capsule 2 mg  2 mg Oral PRN Jordi Kamm B, MD   2 mg at 10/25/17 1410  . magnesium hydroxide (MILK OF MAGNESIA) suspension 30 mL  30 mL Oral Daily PRN Rodolph Hagemann B, MD      . metFORMIN (GLUCOPHAGE) tablet 500 mg  500 mg Oral BID WC Clapacs, Madie Reno, MD   500 mg at 10/27/17 1628  . multivitamin with minerals tablet 1 tablet  1 tablet Oral Daily Joachim Carton B, MD   1 tablet at 10/27/17 0806  . Oxcarbazepine (TRILEPTAL) tablet 300 mg  300 mg Oral BID Tien Aispuro B, MD   300 mg at 10/27/17 1628  . prazosin (MINIPRESS) capsule 2 mg  2 mg Oral BID AC & HS Omir Cooprider B, MD   2 mg at 10/27/17 2123  . promethazine (PHENERGAN) tablet 25 mg  25 mg Oral Q4H PRN Zymarion Favorite B, MD      . protein supplement (PREMIER PROTEIN) liquid  11 oz Oral BID BM Talynn Lebon B, MD   11 oz at 10/27/17 1440  . zolpidem (AMBIEN) tablet 10 mg  10 mg Oral QHS Lorel Lembo B, MD   10 mg at 10/27/17 2119    Lab Results:  Results for orders placed or performed during the hospital encounter of  10/22/17 (from the past 48 hour(s))  Glucose, capillary     Status: Abnormal   Collection Time: 10/26/17  7:03 AM  Result Value Ref Range   Glucose-Capillary 196 (H) 65 - 99 mg/dL  Glucose, capillary     Status: Abnormal  Collection Time: 10/26/17 11:42 AM  Result Value Ref Range   Glucose-Capillary 123 (H) 65 - 99 mg/dL   Comment 1 Notify RN   Glucose, capillary     Status: Abnormal   Collection Time: 10/26/17  4:28 PM  Result Value Ref Range   Glucose-Capillary 146 (H) 65 - 99 mg/dL  Glucose, capillary     Status: Abnormal   Collection Time: 10/26/17  8:31 PM  Result Value Ref Range   Glucose-Capillary 102 (H) 65 - 99 mg/dL  Glucose, capillary     Status: Abnormal   Collection Time: 10/27/17  7:04 AM  Result Value Ref Range   Glucose-Capillary 165 (H) 65 - 99 mg/dL  Glucose, capillary     Status: Abnormal   Collection Time: 10/27/17 11:18 AM  Result Value Ref Range   Glucose-Capillary 131 (H) 65 - 99 mg/dL  Glucose, capillary     Status: Abnormal   Collection Time: 10/27/17  4:29 PM  Result Value Ref Range   Glucose-Capillary 117 (H) 65 - 99 mg/dL   Comment 1 Notify RN   Glucose, capillary     Status: Abnormal   Collection Time: 10/27/17  8:44 PM  Result Value Ref Range   Glucose-Capillary 136 (H) 65 - 99 mg/dL    Blood Alcohol level:  Lab Results  Component Value Date   ETH <10 10/21/2017   ETH  06/24/2010    <5        LOWEST DETECTABLE LIMIT FOR SERUM ALCOHOL IS 5 mg/dL FOR MEDICAL PURPOSES ONLY    Metabolic Disorder Labs: Lab Results  Component Value Date   HGBA1C 11.6 (H) 10/23/2017   MPG 286.22 10/23/2017   MPG 286.22 10/22/2017   No results found for: PROLACTIN Lab Results  Component Value Date   CHOL 176 10/23/2017   TRIG 193 (H) 10/23/2017   HDL 36 (L) 10/23/2017   CHOLHDL 4.9 10/23/2017   VLDL 39 10/23/2017   LDLCALC 101 (H) 10/23/2017    Physical Findings: AIMS: Facial and Oral Movements Muscles of Facial Expression: None, normal Lips  and Perioral Area: None, normal Jaw: None, normal Tongue: None, normal,Extremity Movements Upper (arms, wrists, hands, fingers): None, normal Lower (legs, knees, ankles, toes): None, normal, Trunk Movements Neck, shoulders, hips: None, normal, Overall Severity Severity of abnormal movements (highest score from questions above): None, normal Incapacitation due to abnormal movements: None, normal Patient's awareness of abnormal movements (rate only patient's report): No Awareness, Dental Status Current problems with teeth and/or dentures?: No Does patient usually wear dentures?: No  CIWA:  CIWA-Ar Total: 7 COWS:  COWS Total Score: 3  Musculoskeletal: Strength & Muscle Tone: within normal limits Gait & Station: normal Patient leans: N/A  Psychiatric Specialty Exam: Physical Exam  Nursing note and vitals reviewed. Psychiatric: His speech is normal and behavior is normal. Cognition and memory are normal. He expresses impulsivity. He exhibits a depressed mood. He expresses suicidal ideation.    Review of Systems  Neurological: Negative.   Psychiatric/Behavioral: Positive for depression, substance abuse and suicidal ideas.  All other systems reviewed and are negative.   Blood pressure (!) 133/94, pulse (!) 119, temperature 97.8 F (36.6 C), temperature source Oral, resp. rate 16, height 5\' 11"  (1.803 m), weight 103 kg (227 lb), SpO2 100 %.Body mass index is 31.66 kg/m.  General Appearance: Casual  Eye Contact:  Good  Speech:  Clear and Coherent  Volume:  Normal  Mood:  Anxious  Affect:  Appropriate  Thought Process:  Goal  Directed and Descriptions of Associations: Intact  Orientation:  Full (Time, Place, and Person)  Thought Content:  WDL  Suicidal Thoughts:  Yes.  without intent/plan  Homicidal Thoughts:  No  Memory:  Immediate;   Fair Recent;   Fair Remote;   Fair  Judgement:  Impaired  Insight:  Lacking  Psychomotor Activity:  Normal  Concentration:  Concentration: Fair  and Attention Span: Fair  Recall:  [employer] of Knowledge:  Fair  Language:  Fair  Akathisia:  No  Handed:  Right  AIMS (if indicated):     Assets:  Communication Skills Desire for Improvement Housing Intimacy Resilience Social Support  ADL's:  Intact  Cognition:  WNL  Sleep:  Number of Hours: 5.25     Treatment Plan Summary: Daily contact with patient to assess and evaluate symptoms and progress in treatment and Medication management   Mr. Thune is a 37 year old male with a history of depression, anxiety, mood instability and opioid dependence who was admitted for suicidal ideation with aplan in the context of severe social stressors and substance use. He has multiple legal challenges and was just diagnosed with diabetes and requires insulin treatment.   #Suicidal ideation,passing suicidal ideation -patient is able to contract for safetyin the hospital  #Mood, improved -continueTrileptal 300 mg BID for mood stabilization -increase Abilify to 20 mg daily  -continue Abilify maintena400 mg monthly injectionsto improve compliance, next injection on 5/9  #Insomnia -continue Ambien 10 mg nightly -continue Sinequan 25 mg nightly  #Anxiety of OCD and PTSD type, better -continueMinipress 2 mg BID for nightmares and flashbacks -increase Luvox to 100 mg nightly for OCD -Vistaril 50 mg TID PRN  #Opioid dependence, resolved -completed Subutex taper -discontinue contact precautions and testing  #Substance abuse treatment -positive for opioids and alcohol -desires residential treatment but legal charges prevent him from rehab -will follow with [facility]  #DM, CBG acceptable -continueMetformin 500 mg BID -discontinue Lantus and mealtime Novolog -start Novolog 70/30 18 units BID as more affordable -continue lantusto 25 units daily -ADA diet, SSI, CBG, diabetes teaching -input from [facility] is appreciated -appetite is normal  #HTN, still elevated BP -now on  Minipress -continue Lisinoopril 10 mg daily  #Metabolic syndrome monitoring -lipid panel shows slightly elevated TG, TSH is low with normal T4, HgbA1C 11.6 -EKG reviewed, QTc 440  #Social -legal charges pending with court on Monday -letter sent to his PO  #Disposition -discharge to home with family -follow up with [facility] for medication management and [facility] for treatment of diabetes    Orson Slick, MD 10/28/2017, 5:00 AM

## 2017-10-28 NOTE — Progress Notes (Signed)
Recreation Therapy Notes   Date: 10/28/2017  Time: 9:30 am   Location: Craft Room   Behavioral response: N/A   Intervention Topic: Values   Discussion/Intervention: Patient did not attend group.   Clinical Observations/Feedback:  Patient did not attend group.   Lonna Rabold LRT/CTRS        Ashleigh Arya 10/28/2017 10:25 AM

## 2017-10-28 NOTE — BHH Group Notes (Signed)
[facility] Group Notes:  (Nursing/MHT/Case Management/Adjunct)  Date:  10/28/2017  Time:  3:09 PM  Type of Therapy:  Psychoeducational Skills  Participation Level:  Active  Participation Quality:  Appropriate  Affect:  Appropriate  Cognitive:  Appropriate  Insight:  Appropriate  Engagement in Group:  Engaged  Modes of Intervention:  Socialization  Summary of Progress/Problems:  Adela Lank Ethal Gotay 10/28/2017, 3:09 PM

## 2017-10-28 NOTE — Progress Notes (Signed)
Patient discharged on above date and time. Patient discharged with syringes and alcohol pads provided by the diabetic coordinator. Patient also discharged with personal items, discharge paperwork and discharge instructions. Plans to follow up with [facility]. Did not wait for medications from the pharmacy, states. "I will be back tomorrow to pick up my medicines.

## 2017-10-28 NOTE — Tx Team (Signed)
Interdisciplinary Treatment and Diagnostic Plan Update  10/28/2017 Time of Session: 10:30am Ronald Hampton MRN: [id number]  Principal Diagnosis: Bipolar I disorder, current or most recent episode depressed, with psychotic features ([place])  Secondary Diagnoses: Principal Problem:   Bipolar I disorder, current or most recent episode depressed, with psychotic features ([place]) Active Problems:   PTSD (post-traumatic stress disorder)   Opioid use disorder, moderate, dependence ([place])   Suicidal ideation   DM (diabetes mellitus), type 2, uncontrolled ([place])   HTN (hypertension)   OCD (obsessive compulsive disorder)   Current Medications:  Current Facility-Administered Medications  Medication Dose Route Frequency Provider Last Rate Last Dose  . acetaminophen (TYLENOL) tablet 650 mg  650 mg Oral Q6H PRN Pucilowska, Jolanta B, MD   650 mg at 10/27/17 0617  . alum & mag hydroxide-simeth (MAALOX/MYLANTA) 200-200-20 MG/5ML suspension 30 mL  30 mL Oral Q4H PRN Pucilowska, Jolanta B, MD      . ARIPiprazole (ABILIFY) tablet 20 mg  20 mg Oral Daily Pucilowska, Jolanta B, MD   20 mg at 10/28/17 0806  . ARIPiprazole ER (ABILIFY MAINTENA) injection 400 mg  400 mg Intramuscular Q28 days Pucilowska, Jolanta B, MD   400 mg at 10/24/17 1649  . cloNIDine (CATAPRES) tablet 0.1 mg  0.1 mg Oral BID Pucilowska, Jolanta B, MD   0.1 mg at 10/28/17 0806  . doxepin (SINEQUAN) capsule 25 mg  25 mg Oral QHS Lenward Chancellor, MD   25 mg at 10/27/17 2123  . fluvoxaMINE (LUVOX) tablet 100 mg  100 mg Oral QHS Pucilowska, Jolanta B, MD      . hydrOXYzine (ATARAX/VISTARIL) tablet 50 mg  50 mg Oral TID PRN Pucilowska, Jolanta B, MD   50 mg at 10/27/17 2119  . ibuprofen (ADVIL,MOTRIN) tablet 600 mg  600 mg Oral Q6H PRN Pucilowska, Jolanta B, MD   600 mg at 10/27/17 2119  . insulin aspart (novoLOG) injection 0-15 Units  0-15 Units Subcutaneous TID WC Pucilowska, Jolanta B, MD   3 Units at 10/28/17 0804  . insulin aspart (novoLOG)  injection 0-5 Units  0-5 Units Subcutaneous QHS Pucilowska, Jolanta B, MD   0 Units at 10/27/17 2127  . insulin aspart protamine- aspart (NOVOLOG MIX 70/30) injection 18 Units  18 Units Subcutaneous BID WC Pucilowska, Jolanta B, MD   18 Units at 10/28/17 0805  . lisinopril (PRINIVIL,ZESTRIL) tablet 10 mg  10 mg Oral Daily Pucilowska, Jolanta B, MD   10 mg at 10/28/17 0806  . loperamide (IMODIUM) capsule 2 mg  2 mg Oral PRN Pucilowska, Jolanta B, MD   2 mg at 10/25/17 1410  . magnesium hydroxide (MILK OF MAGNESIA) suspension 30 mL  30 mL Oral Daily PRN Pucilowska, Jolanta B, MD      . metFORMIN (GLUCOPHAGE) tablet 500 mg  500 mg Oral BID WC Clapacs, Madie Reno, MD   500 mg at 10/28/17 0806  . multivitamin with minerals tablet 1 tablet  1 tablet Oral Daily Pucilowska, Jolanta B, MD   1 tablet at 10/28/17 0806  . Oxcarbazepine (TRILEPTAL) tablet 300 mg  300 mg Oral BID Pucilowska, Jolanta B, MD   300 mg at 10/28/17 0807  . prazosin (MINIPRESS) capsule 2 mg  2 mg Oral BID AC & HS Pucilowska, Jolanta B, MD   2 mg at 10/27/17 2123  . promethazine (PHENERGAN) tablet 25 mg  25 mg Oral Q4H PRN Pucilowska, Jolanta B, MD      . protein supplement (PREMIER PROTEIN) liquid  11 oz Oral  BID BM Pucilowska, Jolanta B, MD   11 oz at 10/28/17 1004  . zolpidem (AMBIEN) tablet 10 mg  10 mg Oral QHS Pucilowska, Jolanta B, MD   10 mg at 10/27/17 2119   PTA Medications: Medications Prior to Admission  Medication Sig Dispense Refill Last Dose  . ibuprofen (ADVIL,MOTRIN) 200 MG tablet Take 200 mg by mouth every 6 (six) hours as needed for headache.   10/21/2017 at Unknown time    Patient Stressors: Financial difficulties Health problems Medication change or noncompliance Substance abuse  Patient Strengths: Average or above average intelligence Capable of independent living Communication skills General fund of knowledge Motivation for treatment/growth  Treatment Modalities: Medication Management, Group therapy, Case  management,  1 to 1 session with clinician, Psychoeducation, Recreational therapy.   Physician Treatment Plan for Primary Diagnosis: Bipolar I disorder, current or most recent episode depressed, with psychotic features ([place]) Long Term Goal(s): Improvement in symptoms so as ready for discharge Improvement in symptoms so as ready for discharge   Short Term Goals: Ability to identify changes in lifestyle to reduce recurrence of condition will improve Ability to verbalize feelings will improve Ability to disclose and discuss suicidal ideas Ability to demonstrate self-control will improve Ability to identify and develop effective coping behaviors will improve Ability to maintain clinical measurements within normal limits will improve Compliance with prescribed medications will improve Ability to identify triggers associated with substance abuse/mental health issues will improve Ability to identify changes in lifestyle to reduce recurrence of condition will improve Ability to demonstrate self-control will improve Ability to identify triggers associated with substance abuse/mental health issues will improve  Medication Management: Evaluate patient's response, side effects, and tolerance of medication regimen.  Therapeutic Interventions: 1 to 1 sessions, Unit Group sessions and Medication administration.  Evaluation of Outcomes: Progressing  Physician Treatment Plan for Secondary Diagnosis: Principal Problem:   Bipolar I disorder, current or most recent episode depressed, with psychotic features ([place]) Active Problems:   PTSD (post-traumatic stress disorder)   Opioid use disorder, moderate, dependence ([place])   Suicidal ideation   DM (diabetes mellitus), type 2, uncontrolled ([place])   HTN (hypertension)   OCD (obsessive compulsive disorder)  Long Term Goal(s): Improvement in symptoms so as ready for discharge Improvement in symptoms so as ready for discharge   Short Term Goals: Ability to  identify changes in lifestyle to reduce recurrence of condition will improve Ability to verbalize feelings will improve Ability to disclose and discuss suicidal ideas Ability to demonstrate self-control will improve Ability to identify and develop effective coping behaviors will improve Ability to maintain clinical measurements within normal limits will improve Compliance with prescribed medications will improve Ability to identify triggers associated with substance abuse/mental health issues will improve Ability to identify changes in lifestyle to reduce recurrence of condition will improve Ability to demonstrate self-control will improve Ability to identify triggers associated with substance abuse/mental health issues will improve     Medication Management: Evaluate patient's response, side effects, and tolerance of medication regimen.  Therapeutic Interventions: 1 to 1 sessions, Unit Group sessions and Medication administration.  Evaluation of Outcomes: Progressing   RN Treatment Plan for Primary Diagnosis: Bipolar I disorder, current or most recent episode depressed, with psychotic features ([place]) Long Term Goal(s): Knowledge of disease and therapeutic regimen to maintain health will improve  Short Term Goals: Ability to verbalize feelings will improve, Ability to identify and develop effective coping behaviors will improve and Compliance with prescribed medications will improve  Medication Management: RN  will administer medications as ordered by provider, will assess and evaluate patient's response and provide education to patient for prescribed medication. RN will report any adverse and/or side effects to prescribing provider.  Therapeutic Interventions: 1 on 1 counseling sessions, Psychoeducation, Medication administration, Evaluate responses to treatment, Monitor vital signs and CBGs as ordered, Perform/monitor CIWA, COWS, AIMS and Fall Risk screenings as ordered, Perform wound care  treatments as ordered.  Evaluation of Outcomes: Progressing   LCSW Treatment Plan for Primary Diagnosis: Bipolar I disorder, current or most recent episode depressed, with psychotic features ([place]) Long Term Goal(s): Safe transition to appropriate next level of care at discharge, Engage patient in therapeutic group addressing interpersonal concerns.  Short Term Goals: Engage patient in aftercare planning with referrals and resources, Increase social support, Facilitate patient progression through stages of change regarding substance use diagnoses and concerns, Identify triggers associated with mental health/substance abuse issues and Increase skills for wellness and recovery  Therapeutic Interventions: Assess for all discharge needs, 1 to 1 time with Social worker, Explore available resources and support systems, Assess for adequacy in community support network, Educate family and significant other(s) on suicide prevention, Complete Psychosocial Assessment, Interpersonal group therapy.  Evaluation of Outcomes: Progressing   Progress in Treatment: Attending groups: Yes. Participating in groups: Yes. Taking medication as prescribed: Yes. Toleration medication: Yes. Family/Significant other contact made: No, will contact:  Patient refused Patient understands diagnosis: Yes. Discussing patient identified problems/goals with staff: Yes. Medical problems stabilized or resolved: Yes. Denies suicidal/homicidal ideation: Yes. Issues/concerns per patient self-inventory: No. Other:   New problem(s) identified: No, Describe:  None  New Short Term/Long Term Goal(s): "To get back on my medications and go to outpatient treatment."  Discharge Plan or Barriers: To return home and follow up with [facility] for outpatient treatment.  Reason for Continuation of Hospitalization: Depression Medication stabilization Suicidal ideation  Estimated Length of Stay: 1 day  Recreational Therapy: Patient  Stressors: Death(Son died from SIDS anniversary, recent miscarriage) Patient Goal:  Patient will identify 3 positive coping skills strategies to use for depression post d/c within 5 recreation therapy group sessions  Attendees: Patient:  10/28/2017 11:09 AM  Physician: Dr. Bary Leriche, MD 10/28/2017 11:09 AM  Nursing: Kennon Holter, RN 10/28/2017 11:09 AM  RN Care Manager: 10/28/2017 11:09 AM  Social Worker: Darin Engels, [place] 10/28/2017 11:09 AM  Recreational Therapist: Isaias Sakai. Marcello Fennel, LRT 10/28/2017 11:09 AM  Other: Alden Hipp, LCSW 10/28/2017 11:09 AM  Other: Dossie Arbour, LCSW  10/28/2017 11:09 AM  Other: 10/28/2017 11:09 AM    Scribe for Treatment Team: Darin Engels, LCSW 10/28/2017 11:09 AM

## 2017-10-28 NOTE — Plan of Care (Signed)
Patient up lib with a steady gait. Reports that he slept "ok" last night. Up in the milieu with a steady gait. BS are monitored and coverage administered per MD orders. Affect/mood pleasant and cooperative. Denies SI/HI/AVH and pain at this time. Compliant with meals and medications, milieu remains safe with q 15 minute safety checks. Will continue to monitor and notify MD of any acute changes in condition.

## 2017-10-28 NOTE — BHH Counselor (Signed)
CSW provided the patient with a paper list of residential substance abuse treatment facilities per patient request. Patient will seek out residential substance abuse treatment once he has take care of his court date.  Darin Engels, MSW, [place], LCASA 10/28/2017 11:12 AM

## 2017-10-28 NOTE — BHH Suicide Risk Assessment (Addendum)
[facility] Discharge Suicide Risk Assessment   Principal Problem: Bipolar I disorder, current or most recent episode depressed, with psychotic features [facility]) Discharge Diagnoses:  Patient Active Problem List   Diagnosis Date Noted  . Bipolar I disorder, current or most recent episode depressed, with psychotic features ([place]) [F31.5] 10/22/2017    Priority: High  . DM (diabetes mellitus), type 2, uncontrolled ([place]) [E11.65] 10/23/2017  . HTN (hypertension) [I10] 10/23/2017  . OCD (obsessive compulsive disorder) [F42.9] 10/23/2017  . PTSD (post-traumatic stress disorder) [F43.10] 10/22/2017  . Opioid use disorder, moderate, dependence ([place]) [F11.20] 10/22/2017  . Suicidal ideation [R45.851] 10/22/2017    Total Time spent with patient: 20 minutes plus 20 min on care coordination and documentation  Musculoskeletal: Strength & Muscle Tone: within normal limits Gait & Station: normal Patient leans: N/A  Psychiatric Specialty Exam: Review of Systems  Neurological: Negative.   Psychiatric/Behavioral: Positive for substance abuse.  All other systems reviewed and are negative.   Blood pressure 126/90, pulse (!) 116, temperature 98.2 F (36.8 C), temperature source Oral, resp. rate 18, height 5\' 11"  (1.803 m), weight 103 kg (227 lb), SpO2 98 %.Body mass index is 31.66 kg/m.  General Appearance: Casual  Eye Contact::  Good  Speech:  Clear and Coherent409  Volume:  Normal  Mood:  Euthymic  Affect:  Appropriate  Thought Process:  Goal Directed and Descriptions of Associations: Intact  Orientation:  Full (Time, Place, and Person)  Thought Content:  WDL  Suicidal Thoughts:  No  Homicidal Thoughts:  No  Memory:  Immediate;   Fair Recent;   Fair Remote;   Fair  Judgement:  Impaired  Insight:  Shallow  Psychomotor Activity:  Normal  Concentration:  Fair  Recall:  [employer] of Knowledge:Fair  Language: Fair  Akathisia:  No  Handed:  Right  AIMS (if indicated):     Assets:  Communication  Skills Desire for Improvement Housing Resilience Social Support  Sleep:  Number of Hours: 7  Cognition: WNL  ADL's:  Intact   Mental Status Per Nursing Assessment::   On Admission:  Self-harm thoughts  Demographic Factors:  Male, Divorced or widowed, Caucasian, Low socioeconomic status and Unemployed  Loss Factors: Decline in physical health  Historical Factors: Prior suicide attempts, Family history of mental illness or substance abuse and Impulsivity  Risk Reduction Factors:   Sense of responsibility to family, Living with another person, especially a relative and Positive social support  Continued Clinical Symptoms:  Bipolar Disorder:   Depressive phase Depression:   Comorbid alcohol abuse/dependence Impulsivity Alcohol/Substance Abuse/Dependencies  Cognitive Features That Contribute To Risk:  None    Suicide Risk:  Minimal: No identifiable suicidal ideation.  Patients presenting with no risk factors but with morbid ruminations; may be classified as minimal risk based on the severity of the depressive symptoms  Follow-up [place], [place]. Go on 10/31/2017.   Why:  Please attend your follow up appointment scheduled for Thursday 10/31/2017 at 9:45am. Please bring your ID, SSI card, Insurance card and proof of income. Thank you. Contact information: [place] [address] [phone]        [facility] Follow up.   Why:  Please complete the applications provided to you and gather the needed information and take it to the facility. Once you drop off the papwerwork they will allow you to schedule an appointment. Thank you. Contact information: [place], [address]  Phone: [phone]  Fax: [phone]          Plan Of Care/Follow-up recommendations:  Activity:  as tolerated Diet:  low sodium heart healthy ADA diet Other:  keep follow up appointments  Orson Slick,  MD 10/28/2017, 2:06 PM
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
## 2017-11-09 ENCOUNTER — Other Ambulatory Visit: Payer: Self-pay

## 2017-11-09 ENCOUNTER — Encounter (HOSPITAL_COMMUNITY): Payer: Self-pay | Admitting: Emergency Medicine

## 2017-11-09 ENCOUNTER — Emergency Department (HOSPITAL_COMMUNITY)
Admission: EM | Admit: 2017-11-09 | Discharge: 2017-11-10 | Disposition: A | Discharge status: Other Acute Inpatient Hospital | Payer: Self-pay | Attending: Emergency Medicine | Admitting: Emergency Medicine

## 2017-11-09 DIAGNOSIS — F319 Bipolar disorder, unspecified: Secondary | ICD-10-CM | POA: Insufficient documentation

## 2017-11-09 DIAGNOSIS — F1120 Opioid dependence, uncomplicated: Secondary | ICD-10-CM | POA: Insufficient documentation

## 2017-11-09 DIAGNOSIS — I10 Essential (primary) hypertension: Secondary | ICD-10-CM | POA: Insufficient documentation

## 2017-11-09 DIAGNOSIS — F322 Major depressive disorder, single episode, severe without psychotic features: Secondary | ICD-10-CM | POA: Insufficient documentation

## 2017-11-09 DIAGNOSIS — Z79899 Other long term (current) drug therapy: Secondary | ICD-10-CM | POA: Insufficient documentation

## 2017-11-09 DIAGNOSIS — Z794 Long term (current) use of insulin: Secondary | ICD-10-CM | POA: Insufficient documentation

## 2017-11-09 DIAGNOSIS — E119 Type 2 diabetes mellitus without complications: Secondary | ICD-10-CM | POA: Insufficient documentation

## 2017-11-09 DIAGNOSIS — R45851 Suicidal ideations: Secondary | ICD-10-CM

## 2017-11-09 LAB — COMPREHENSIVE METABOLIC PANEL
ALT: 49 U/L (ref 17–63)
AST: 25 U/L (ref 15–41)
Albumin: 3.8 g/dL (ref 3.5–5.0)
Alkaline Phosphatase: 80 U/L (ref 38–126)
Anion gap: 13 (ref 5–15)
BUN: 8 mg/dL (ref 6–20)
CO2: 23 mmol/L (ref 22–32)
Calcium: 9.8 mg/dL (ref 8.9–10.3)
Chloride: 101 mmol/L (ref 101–111)
Creatinine, Ser: 0.88 mg/dL (ref 0.61–1.24)
GFR calc Af Amer: >60 mL/min (ref >60)
GFR calc non Af Amer: >60 mL/min (ref >60)
Glucose, Bld: 351 mg/dL — ABNORMAL HIGH (ref 65–99)
Potassium: 3.8 mmol/L (ref 3.5–5.1)
Sodium: 137 mmol/L (ref 135–145)
Total Bilirubin: 0.7 mg/dL (ref 0.3–1.2)
Total Protein: 7.4 g/dL (ref 6.5–8.1)

## 2017-11-09 LAB — ACETAMINOPHEN LEVEL: Acetaminophen (Tylenol), Serum: <10 ug/mL — ABNORMAL LOW (ref 10–30)

## 2017-11-09 LAB — CBC
HCT: 42.1 % (ref 39.0–52.0)
Hemoglobin: 14.2 g/dL (ref 13.0–17.0)
MCH: 28.2 pg (ref 26.0–34.0)
MCHC: 33.7 g/dL (ref 30.0–36.0)
MCV: 83.5 fL (ref 78.0–100.0)
Platelets: 212 10*3/uL (ref 150–400)
RBC: 5.04 MIL/uL (ref 4.22–5.81)
RDW: 14.4 % (ref 11.5–15.5)
WBC: 9 10*3/uL (ref 4.0–10.5)

## 2017-11-09 LAB — RAPID URINE DRUG SCREEN, HOSP PERFORMED
Amphetamines: POSITIVE — AB
Barbiturates: NOT DETECTED
Benzodiazepines: NOT DETECTED
Cocaine: NOT DETECTED
Opiates: POSITIVE — AB
Tetrahydrocannabinol: NOT DETECTED

## 2017-11-09 LAB — ETHANOL: Alcohol, Ethyl (B): <10 mg/dL (ref <10)

## 2017-11-09 LAB — SALICYLATE LEVEL: Salicylate Lvl: <7 mg/dL (ref 2.8–30.0)

## 2017-11-09 NOTE — ED Triage Notes (Signed)
Patient presents to ED with suicidal thoughts after writing a note with plans to hang himself this afternoon. Patient states his family brought him here and dropped him off.  Patient states recent hospitalization with changes to his meds that "aren't sitting right with me".  Patient denies physical complains minus a 3/10 headache.

## 2017-11-09 NOTE — ED Notes (Addendum)
Belongings placed in locker 5, patient wanded, sitter at bedside

## 2017-11-10 ENCOUNTER — Encounter (HOSPITAL_COMMUNITY): Payer: Self-pay | Admitting: *Deleted

## 2017-11-10 ENCOUNTER — Inpatient Hospital Stay (HOSPITAL_COMMUNITY)
Admission: AD | Admit: 2017-11-10 | Discharge: 2017-11-14 | DRG: 885 | Disposition: A | Discharge status: Home / Self Care | Payer: No Typology Code available for payment source | Source: Intra-hospital | Attending: Psychiatry | Admitting: Psychiatry

## 2017-11-10 ENCOUNTER — Other Ambulatory Visit: Payer: Self-pay

## 2017-11-10 DIAGNOSIS — Z794 Long term (current) use of insulin: Secondary | ICD-10-CM | POA: Diagnosis not present

## 2017-11-10 DIAGNOSIS — Z915 Personal history of self-harm: Secondary | ICD-10-CM

## 2017-11-10 DIAGNOSIS — Z653 Problems related to other legal circumstances: Secondary | ICD-10-CM

## 2017-11-10 DIAGNOSIS — Z62810 Personal history of physical and sexual abuse in childhood: Secondary | ICD-10-CM | POA: Diagnosis present

## 2017-11-10 DIAGNOSIS — E1165 Type 2 diabetes mellitus with hyperglycemia: Secondary | ICD-10-CM | POA: Diagnosis not present

## 2017-11-10 DIAGNOSIS — F429 Obsessive-compulsive disorder, unspecified: Secondary | ICD-10-CM | POA: Diagnosis present

## 2017-11-10 DIAGNOSIS — F315 Bipolar disorder, current episode depressed, severe, with psychotic features: Secondary | ICD-10-CM | POA: Diagnosis not present

## 2017-11-10 DIAGNOSIS — Z79899 Other long term (current) drug therapy: Secondary | ICD-10-CM

## 2017-11-10 DIAGNOSIS — G4700 Insomnia, unspecified: Secondary | ICD-10-CM | POA: Diagnosis present

## 2017-11-10 DIAGNOSIS — F419 Anxiety disorder, unspecified: Secondary | ICD-10-CM | POA: Diagnosis not present

## 2017-11-10 DIAGNOSIS — F1123 Opioid dependence with withdrawal: Secondary | ICD-10-CM | POA: Diagnosis not present

## 2017-11-10 DIAGNOSIS — F1120 Opioid dependence, uncomplicated: Secondary | ICD-10-CM | POA: Diagnosis present

## 2017-11-10 DIAGNOSIS — R45851 Suicidal ideations: Secondary | ICD-10-CM | POA: Diagnosis present

## 2017-11-10 DIAGNOSIS — E119 Type 2 diabetes mellitus without complications: Secondary | ICD-10-CM | POA: Diagnosis present

## 2017-11-10 DIAGNOSIS — F3130 Bipolar disorder, current episode depressed, mild or moderate severity, unspecified: Secondary | ICD-10-CM | POA: Diagnosis present

## 2017-11-10 DIAGNOSIS — F322 Major depressive disorder, single episode, severe without psychotic features: Secondary | ICD-10-CM | POA: Insufficient documentation

## 2017-11-10 DIAGNOSIS — R45850 Homicidal ideations: Secondary | ICD-10-CM | POA: Diagnosis not present

## 2017-11-10 DIAGNOSIS — R450 Nervousness: Secondary | ICD-10-CM | POA: Diagnosis not present

## 2017-11-10 DIAGNOSIS — I10 Essential (primary) hypertension: Secondary | ICD-10-CM | POA: Diagnosis present

## 2017-11-10 DIAGNOSIS — F1523 Other stimulant dependence with withdrawal: Secondary | ICD-10-CM | POA: Diagnosis not present

## 2017-11-10 DIAGNOSIS — F4310 Post-traumatic stress disorder, unspecified: Secondary | ICD-10-CM | POA: Diagnosis present

## 2017-11-10 DIAGNOSIS — F329 Major depressive disorder, single episode, unspecified: Secondary | ICD-10-CM | POA: Diagnosis present

## 2017-11-10 DIAGNOSIS — Z818 Family history of other mental and behavioral disorders: Secondary | ICD-10-CM | POA: Diagnosis not present

## 2017-11-10 LAB — CBG MONITORING, ED
Glucose-Capillary: 297 mg/dL — ABNORMAL HIGH (ref 65–99)
Glucose-Capillary: 273 mg/dL — ABNORMAL HIGH (ref 65–99)
Glucose-Capillary: 236 mg/dL — ABNORMAL HIGH (ref 65–99)

## 2017-11-10 LAB — GLUCOSE, CAPILLARY
Glucose-Capillary: 146 mg/dL — ABNORMAL HIGH (ref 65–99)
Glucose-Capillary: 146 mg/dL — ABNORMAL HIGH (ref 65–99)

## 2017-11-10 MED ORDER — METHOCARBAMOL 500 MG PO TABS
500.0000 mg | ORAL_TABLET | Freq: Three times a day (TID) | ORAL | Status: DC | PRN
  Administered 2017-11-10: 500 mg via ORAL
  Filled 2017-11-10: qty 1

## 2017-11-10 MED ORDER — CLONIDINE HCL 0.1 MG PO TABS
0.1000 mg | ORAL_TABLET | ORAL | Status: AC
  Administered 2017-11-12 – 2017-11-14 (×4): 0.1 mg via ORAL
  Filled 2017-11-10 (×4): qty 1

## 2017-11-10 MED ORDER — INSULIN ASPART 100 UNIT/ML ~~LOC~~ SOLN
4.0000 [IU] | Freq: Three times a day (TID) | SUBCUTANEOUS | Status: DC
  Administered 2017-11-10 (×2): 4 [IU] via SUBCUTANEOUS
  Filled 2017-11-10 (×2): qty 1

## 2017-11-10 MED ORDER — DOXEPIN HCL 25 MG PO CAPS: 25.0000 mg | ORAL_CAPSULE | Freq: Every day | ORAL | Status: DC

## 2017-11-10 MED ORDER — LISINOPRIL 10 MG PO TABS
10.0000 mg | ORAL_TABLET | Freq: Every day | ORAL | Status: DC
  Administered 2017-11-11 – 2017-11-14 (×4): 10 mg via ORAL
  Filled 2017-11-10: qty 7
  Filled 2017-11-10 (×5): qty 1

## 2017-11-10 MED ORDER — ACETAMINOPHEN 325 MG PO TABS
650.0000 mg | ORAL_TABLET | ORAL | Status: DC | PRN
Start: 2017-11-10 — End: 2017-11-14
  Administered 2017-11-13: 650 mg via ORAL
  Filled 2017-11-10: qty 2

## 2017-11-10 MED ORDER — DOXEPIN HCL 25 MG PO CAPS
25.0000 mg | ORAL_CAPSULE | Freq: Every day | ORAL | Status: DC
  Administered 2017-11-10 – 2017-11-13 (×4): 25 mg via ORAL
  Filled 2017-11-10: qty 1
  Filled 2017-11-10: qty 7
  Filled 2017-11-10 (×5): qty 1

## 2017-11-10 MED ORDER — DICYCLOMINE HCL 20 MG PO TABS: 20.0000 mg | ORAL_TABLET | Freq: Four times a day (QID) | ORAL | Status: DC | PRN

## 2017-11-10 MED ORDER — METFORMIN HCL 500 MG PO TABS
500.0000 mg | ORAL_TABLET | Freq: Two times a day (BID) | ORAL | Status: DC
  Administered 2017-11-10: 500 mg via ORAL
  Filled 2017-11-10: qty 1

## 2017-11-10 MED ORDER — ARIPIPRAZOLE 10 MG PO TABS
20.0000 mg | ORAL_TABLET | Freq: Every day | ORAL | Status: DC
  Administered 2017-11-11 – 2017-11-14 (×4): 20 mg via ORAL
  Filled 2017-11-10: qty 2
  Filled 2017-11-10: qty 14
  Filled 2017-11-10 (×4): qty 2

## 2017-11-10 MED ORDER — METHOCARBAMOL 500 MG PO TABS
500.0000 mg | ORAL_TABLET | Freq: Three times a day (TID) | ORAL | Status: DC | PRN
  Administered 2017-11-11 – 2017-11-12 (×4): 500 mg via ORAL
  Filled 2017-11-10 (×5): qty 1

## 2017-11-10 MED ORDER — INSULIN ASPART 100 UNIT/ML ~~LOC~~ SOLN
4.0000 [IU] | Freq: Three times a day (TID) | SUBCUTANEOUS | Status: DC
  Administered 2017-11-10 – 2017-11-14 (×13): 4 [IU] via SUBCUTANEOUS
  Filled 2017-11-10: qty 0.04

## 2017-11-10 MED ORDER — CLONIDINE HCL 0.2 MG PO TABS
0.1000 mg | ORAL_TABLET | Freq: Four times a day (QID) | ORAL | Status: DC
  Administered 2017-11-10: 0.1 mg via ORAL
  Filled 2017-11-10: qty 1

## 2017-11-10 MED ORDER — PRAZOSIN HCL 2 MG PO CAPS
2.0000 mg | ORAL_CAPSULE | Freq: Two times a day (BID) | ORAL | Status: DC
  Administered 2017-11-10 – 2017-11-14 (×8): 2 mg via ORAL
  Filled 2017-11-10 (×6): qty 1
  Filled 2017-11-10: qty 14
  Filled 2017-11-10: qty 1
  Filled 2017-11-10: qty 14
  Filled 2017-11-10: qty 1
  Filled 2017-11-10: qty 2
  Filled 2017-11-10 (×2): qty 1

## 2017-11-10 MED ORDER — ACETAMINOPHEN 325 MG PO TABS: 650.0000 mg | ORAL_TABLET | ORAL | Status: DC | PRN

## 2017-11-10 MED ORDER — PRAZOSIN HCL 2 MG PO CAPS
2.0000 mg | ORAL_CAPSULE | Freq: Two times a day (BID) | ORAL | Status: DC
  Administered 2017-11-10: 2 mg via ORAL
  Filled 2017-11-10: qty 1

## 2017-11-10 MED ORDER — OXCARBAZEPINE 300 MG PO TABS
300.0000 mg | ORAL_TABLET | Freq: Two times a day (BID) | ORAL | Status: DC
  Administered 2017-11-10 – 2017-11-11 (×3): 300 mg via ORAL
  Filled 2017-11-10 (×7): qty 1

## 2017-11-10 MED ORDER — INSULIN ASPART PROT & ASPART (70-30 MIX) 100 UNIT/ML ~~LOC~~ SUSP
18.0000 [IU] | Freq: Two times a day (BID) | SUBCUTANEOUS | Status: DC
  Administered 2017-11-10: 18 [IU] via SUBCUTANEOUS
  Filled 2017-11-10: qty 10

## 2017-11-10 MED ORDER — INSULIN GLARGINE 100 UNIT/ML ~~LOC~~ SOLN: 25.0000 [IU] | SUBCUTANEOUS | Status: DC

## 2017-11-10 MED ORDER — ZOLPIDEM TARTRATE 5 MG PO TABS
10.0000 mg | ORAL_TABLET | Freq: Every day | ORAL | Status: DC
  Administered 2017-11-10 – 2017-11-13 (×4): 10 mg via ORAL
  Filled 2017-11-10 (×4): qty 2

## 2017-11-10 MED ORDER — HYDROXYZINE HCL 50 MG PO TABS
50.0000 mg | ORAL_TABLET | Freq: Three times a day (TID) | ORAL | Status: DC | PRN
  Administered 2017-11-11 – 2017-11-14 (×3): 50 mg via ORAL
  Filled 2017-11-10: qty 10
  Filled 2017-11-10 (×4): qty 1

## 2017-11-10 MED ORDER — INSULIN ASPART PROT & ASPART (70-30 MIX) 100 UNIT/ML ~~LOC~~ SUSP
18.0000 [IU] | Freq: Two times a day (BID) | SUBCUTANEOUS | Status: DC
  Administered 2017-11-10 – 2017-11-14 (×9): 18 [IU] via SUBCUTANEOUS
  Filled 2017-11-10: qty 0.18

## 2017-11-10 MED ORDER — FLUVOXAMINE MALEATE 100 MG PO TABS
100.0000 mg | ORAL_TABLET | Freq: Every day | ORAL | Status: DC
  Administered 2017-11-10 – 2017-11-13 (×4): 100 mg via ORAL
  Filled 2017-11-10: qty 7
  Filled 2017-11-10 (×5): qty 1
  Filled 2017-11-10: qty 2

## 2017-11-10 MED ORDER — DICYCLOMINE HCL 20 MG PO TABS
20.0000 mg | ORAL_TABLET | Freq: Four times a day (QID) | ORAL | Status: DC | PRN
  Administered 2017-11-10: 20 mg via ORAL
  Filled 2017-11-10: qty 1

## 2017-11-10 MED ORDER — CLONIDINE HCL 0.2 MG PO TABS: 0.1000 mg | ORAL_TABLET | ORAL | Status: DC

## 2017-11-10 MED ORDER — METFORMIN HCL 500 MG PO TABS
500.0000 mg | ORAL_TABLET | Freq: Two times a day (BID) | ORAL | Status: DC
  Administered 2017-11-10 – 2017-11-14 (×9): 500 mg via ORAL
  Filled 2017-11-10 (×2): qty 1
  Filled 2017-11-10: qty 14
  Filled 2017-11-10 (×3): qty 1
  Filled 2017-11-10: qty 14
  Filled 2017-11-10 (×6): qty 1

## 2017-11-10 MED ORDER — ALUM & MAG HYDROXIDE-SIMETH 200-200-20 MG/5ML PO SUSP: 30.0000 mL | Freq: Four times a day (QID) | ORAL | Status: DC | PRN

## 2017-11-10 MED ORDER — CLONIDINE HCL 0.2 MG PO TABS: 0.1000 mg | ORAL_TABLET | Freq: Every day | ORAL | Status: DC

## 2017-11-10 MED ORDER — CLONIDINE HCL 0.1 MG PO TABS
0.1000 mg | ORAL_TABLET | Freq: Every day | ORAL | Status: DC
  Filled 2017-11-10 (×2): qty 1

## 2017-11-10 MED ORDER — FLUVOXAMINE MALEATE 50 MG PO TABS: 100.0000 mg | ORAL_TABLET | Freq: Every day | ORAL | Status: DC

## 2017-11-10 MED ORDER — LISINOPRIL 10 MG PO TABS
10.0000 mg | ORAL_TABLET | Freq: Every day | ORAL | Status: DC
  Administered 2017-11-10: 10 mg via ORAL
  Filled 2017-11-10: qty 1

## 2017-11-10 MED ORDER — OXCARBAZEPINE 300 MG PO TABS
300.0000 mg | ORAL_TABLET | Freq: Two times a day (BID) | ORAL | Status: DC
  Administered 2017-11-10: 300 mg via ORAL
  Filled 2017-11-10: qty 1

## 2017-11-10 MED ORDER — LOPERAMIDE HCL 2 MG PO CAPS: 2.0000 mg | ORAL_CAPSULE | ORAL | Status: DC | PRN

## 2017-11-10 MED ORDER — NAPROXEN 250 MG PO TABS
500.0000 mg | ORAL_TABLET | Freq: Two times a day (BID) | ORAL | Status: DC | PRN
  Administered 2017-11-10: 500 mg via ORAL
  Filled 2017-11-10: qty 2

## 2017-11-10 MED ORDER — NAPROXEN 500 MG PO TABS
500.0000 mg | ORAL_TABLET | Freq: Two times a day (BID) | ORAL | Status: DC | PRN
  Administered 2017-11-11 – 2017-11-14 (×5): 500 mg via ORAL
  Filled 2017-11-10 (×6): qty 1

## 2017-11-10 MED ORDER — CLONIDINE HCL 0.1 MG PO TABS
0.1000 mg | ORAL_TABLET | Freq: Four times a day (QID) | ORAL | Status: AC
  Administered 2017-11-10 – 2017-11-12 (×7): 0.1 mg via ORAL
  Filled 2017-11-10 (×9): qty 1

## 2017-11-10 MED ORDER — ARIPIPRAZOLE 10 MG PO TABS
20.0000 mg | ORAL_TABLET | Freq: Every day | ORAL | Status: DC
  Administered 2017-11-10: 20 mg via ORAL
  Filled 2017-11-10: qty 2

## 2017-11-10 MED ORDER — ZOLPIDEM TARTRATE 5 MG PO TABS: 10.0000 mg | ORAL_TABLET | Freq: Every day | ORAL | Status: DC

## 2017-11-10 MED ORDER — HYDROXYZINE HCL 50 MG PO TABS
50.0000 mg | ORAL_TABLET | Freq: Three times a day (TID) | ORAL | Status: DC | PRN
  Administered 2017-11-10: 50 mg via ORAL
  Filled 2017-11-10: qty 1
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
## 2017-11-10 NOTE — Progress Notes (Signed)
Pt accepted to [facility], room #306-1. Shuvon Rankin, NP is the accepting provider.   Dr. Lissa Hoard is the attending provider.   Call report to [phone]. [facility] ED notified.   Pt is voluntary and may be transported by [facility].  Pt is scheduled to arrive at [facility] as soon as transportation is arranged.   Audree Camel, LCSW, [place] Disposition [place] [facility] BHH/TTS [phone]
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
## 2017-11-10 NOTE — ED Notes (Signed)
Offered for pt to shower - states may later.

## 2017-11-10 NOTE — ED Notes (Signed)
TTS in progress at this time.  

## 2017-11-10 NOTE — Tx Team (Signed)
Initial Treatment Plan 11/10/2017 2:47 PM MUNACHIMSO PALIN [id number]    PATIENT STRESSORS: Marital or family conflict Substance abuse   PATIENT STRENGTHS: Ability for insight Average or above average intelligence Capable of independent living General fund of knowledge Motivation for treatment/growth   PATIENT IDENTIFIED PROBLEMS: Depression Suicidal thoughts Heroin Abuse "Change my meds up"                     DISCHARGE CRITERIA:  Ability to meet basic life and health needs Improved stabilization in mood, thinking, and/or behavior Reduction of life-threatening or endangering symptoms to within safe limits Verbal commitment to aftercare and medication compliance Withdrawal symptoms are absent or subacute and managed without 24-hour nursing intervention  PRELIMINARY DISCHARGE PLAN: Attend aftercare/continuing care group Return to previous living arrangement  PATIENT/FAMILY INVOLVEMENT: This treatment plan has been presented to and reviewed with the patient, ABOU STERKEL, and/or family member, .  The patient and family have been given the opportunity to ask questions and make suggestions.  Jenaveve Fenstermaker, [place], [place] 11/10/2017, 2:47 PM

## 2017-11-10 NOTE — ED Notes (Signed)
Pt inquiring about tx plan - advised pt Counselor to discuss w/NP, [facility]. Voiced understanding.
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
## 2017-11-10 NOTE — ED Notes (Signed)
Breakfast tray ordered 

## 2017-11-10 NOTE — ED Notes (Signed)
Pt ate lunch - c/o "my stomach is starting to cramp. Can I get some of that withdrawal medicine?"

## 2017-11-10 NOTE — ED Notes (Signed)
States would like to go back to [facility] d/t "I liked Dr Mamie Nick. She agreed to release me too soon because I had something I needed to take care of". "I told her I didn't feel like the meds were working". "My family thinks I need Inpt tx and I guess I do too". States last heroin use was 11/09/17 am. Denies any c/o withdrawal symptoms. Voiced understanding to notify RN if he does.

## 2017-11-10 NOTE — ED Notes (Signed)
Pt voiced voiced understanding and agreement w/tx. Accepted to [facility] - signed consent forms - copy faxed to [facility], copy sent to Medical Records, and original placed in envelope for [place]. Pt called family member to advise of tx plan. ALL belongings - 2 labeled bags - [place] - NO Valuables noted.

## 2017-11-10 NOTE — Progress Notes (Signed)
D.  Pt pleasant on approach, mild withdrawal symptoms reported at this time.  Pt did not attend evening group tonight, was in bed sleeping as this is his first night on the unit.  Pt did get up later and get snacks and was observed interacting appropriately on the unit.  Pt denies SI/HI/AVH at this time.  A.  Support and encouragement offered, medication given as ordered  R.  Pt remains safe on the unit, will continue to monitor.

## 2017-11-11 DIAGNOSIS — I10 Essential (primary) hypertension: Secondary | ICD-10-CM

## 2017-11-11 DIAGNOSIS — Z818 Family history of other mental and behavioral disorders: Secondary | ICD-10-CM

## 2017-11-11 DIAGNOSIS — E1165 Type 2 diabetes mellitus with hyperglycemia: Secondary | ICD-10-CM

## 2017-11-11 LAB — GLUCOSE, CAPILLARY
Glucose-Capillary: 210 mg/dL — ABNORMAL HIGH (ref 65–99)
Glucose-Capillary: 186 mg/dL — ABNORMAL HIGH (ref 65–99)
Glucose-Capillary: 174 mg/dL — ABNORMAL HIGH (ref 65–99)
Glucose-Capillary: 185 mg/dL — ABNORMAL HIGH (ref 65–99)

## 2017-11-11 MED ORDER — DIVALPROEX SODIUM 500 MG PO DR TAB
500.0000 mg | DELAYED_RELEASE_TABLET | Freq: Two times a day (BID) | ORAL | Status: DC
  Administered 2017-11-11 – 2017-11-14 (×6): 500 mg via ORAL
  Filled 2017-11-11 (×3): qty 1
  Filled 2017-11-11: qty 14
  Filled 2017-11-11 (×4): qty 1
  Filled 2017-11-11: qty 14
  Filled 2017-11-11 (×2): qty 1
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
## 2017-11-11 NOTE — BHH Suicide Risk Assessment (Signed)
[facility] Admission Suicide Risk Assessment   Nursing information obtained from:    Demographic factors:    Current Mental Status:    Loss Factors:    Historical Factors:    Risk Reduction Factors:     Total Time spent with patient: 1 hour Principal Problem: Bipolar I disorder, current or most recent episode depressed, with psychotic features [facility]) Diagnosis:   Patient Active Problem List   Diagnosis Date Noted  . MDD (major depressive disorder), severe ([place]) [F32.2] 11/10/2017  . DM (diabetes mellitus), type 2, uncontrolled ([place]) [E11.65] 10/23/2017  . HTN (hypertension) [I10] 10/23/2017  . OCD (obsessive compulsive disorder) [F42.9] 10/23/2017  . Bipolar I disorder, current or most recent episode depressed, with psychotic features ([place]) [F31.5] 10/22/2017  . PTSD (post-traumatic stress disorder) [F43.10] 10/22/2017  . Opioid use disorder, moderate, dependence ([place]) [F11.20] 10/22/2017  . Suicidal ideation [R45.851] 10/22/2017   Subjective Data: See H&P for details  Continued Clinical Symptoms:  Alcohol Use Disorder Identification Test Final Score (AUDIT): 7 The "Alcohol Use Disorders Identification Test", Guidelines for Use in Primary Care, Second Edition.  World Pharmacologist [facility]). Score between 0-7:  no or low risk or alcohol related problems. Score between 8-15:  moderate risk of alcohol related problems. Score between 16-19:  high risk of alcohol related problems. Score 20 or above:  warrants further diagnostic evaluation for alcohol dependence and treatment.   CLINICAL FACTORS:   Severe Anxiety and/or Agitation Bipolar Disorder:   Mixed State Alcohol/Substance Abuse/Dependencies More than one psychiatric diagnosis Currently Psychotic Unstable or Poor Therapeutic Relationship Previous Psychiatric Diagnoses and Treatments Medical Diagnoses and Treatments/Surgeries     Psychiatric Specialty Exam: Physical Exam  Nursing note and vitals reviewed.   ROS- See H&P  for details  Blood pressure (!) 138/92, pulse (!) 105, temperature 98.1 F (36.7 C), temperature source Oral, resp. rate 18, height 5\' 9"  (1.753 m), weight 107 kg (236 lb).Body mass index is 34.85 kg/m.   COGNITIVE FEATURES THAT CONTRIBUTE TO RISK:  None    SUICIDE RISK:   Severe:  Frequent, intense, and enduring suicidal ideation, specific plan, no subjective intent, but some objective markers of intent (i.e., choice of lethal method), the method is accessible, some limited preparatory behavior, evidence of impaired self-control, severe dysphoria/symptomatology, multiple risk factors present, and few if any protective factors, particularly a lack of social support.  PLAN OF CARE: See H&P for details  I certify that inpatient services furnished can reasonably be expected to improve the patient's condition.   Pennelope Bracken, MD 11/11/2017, 5:42 PM
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
## 2017-11-11 NOTE — Progress Notes (Signed)
Psychoeducational Group Note  Date:  11/11/2017 Time:  2313  Group Topic/Focus:  Wrap-Up Group:   The focus of this group is to help patients review their daily goal of treatment and discuss progress on daily workbooks.  Participation Level: Did Not Attend  Participation Quality:  Not Applicable  Affect:  Not Applicable  Cognitive:  Not Applicable  Insight:  Not Applicable  Engagement in Group: Not Applicable  Additional Comments:  The patient did not attend group this evening since he remained in his bedroom for the session.   Lynnmarie Lovett S 11/11/2017, 11:13 PM

## 2017-11-11 NOTE — Progress Notes (Signed)
Pt did not attend tonight's AA group.

## 2017-11-11 NOTE — BHH Suicide Risk Assessment (Signed)
[place] INPATIENT:  Family/Significant Other Suicide Prevention Education  Suicide Prevention Education:  Patient Refusal for Family/Significant Other Suicide Prevention Education: The patient Ronald Hampton has refused to provide written consent for family/significant other to be provided Family/Significant Other Suicide Prevention Education during admission and/or prior to discharge.  Physician notified.  SPE completed with patient, as patient refused to consent to family contact. SPI pamphlet provided to pt and pt was encouraged to share information with support network, ask questions, and talk about any concerns relating to SPE. Patient denies access to guns/firearms and verbalized understanding of information provided. Mobile Crisis information also provided to patient.    [name] 11/11/2017, 2:19 PM

## 2017-11-11 NOTE — Plan of Care (Signed)
Nurse discussed SI thoughts, anxiety, depression, coping skills with patient.

## 2017-11-11 NOTE — BHH Group Notes (Signed)
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
## 2017-11-11 NOTE — Progress Notes (Signed)
Recreation Therapy Notes  Date: 4.29.19 Time: 0930 Location: [address] Dayroom  Group Topic: Stress Management  Goal Area(s) Addresses:  Patient will verbalize importance of using healthy stress management.  Patient will identify positive emotions associated with healthy stress management.   Intervention: Stress Management  Activity :  Body Scan Meditation.  LRT introduced the stress management technique of meditation to patients.  LRT played a meditation that focused on identifying any feeling of tension and stress in the body.  Patients were to follow along as the meditation played.  Education:  Stress Management, Discharge Planning.   Education Outcome: Acknowledges edcuation/In group clarification offered/Needs additional education  Clinical Observations/Feedback: Pt did not attend group.    Victorino Sparrow, LRT/CTRS         Victorino Sparrow A 11/11/2017 12:25 PM

## 2017-11-11 NOTE — Progress Notes (Signed)
D   Pt endorses depression and he appears sad    Pt interacts appropriately with staff and peers   He is compliant with treatment he also complains of anxiety    A   Verbal support given   Medication administered and effectiveness monitored   Q 15 min checks R   Pt is safe at present time

## 2017-11-11 NOTE — Tx Team (Signed)
Interdisciplinary Treatment and Diagnostic Plan Update  11/11/2017 Time of Session: [name] MRN: [id number]  Principal Diagnosis: MDD, recurrent, severe  Secondary Diagnoses: Active Problems:   MDD (major depressive disorder), severe (HCC)   Current Medications:  Current Facility-Administered Medications  Medication Dose Route Frequency Provider Last Rate Last Dose  . acetaminophen (TYLENOL) tablet 650 mg  650 mg Oral Q4H PRN Nwoko, Agnes I, NP      . ARIPiprazole (ABILIFY) tablet 20 mg  20 mg Oral Daily Rankin, Shuvon B, NP   20 mg at 11/11/17 0815  . cloNIDine (CATAPRES) tablet 0.1 mg  0.1 mg Oral QID Rankin, Shuvon B, NP   0.1 mg at 11/11/17 0816   Followed by  . [START ON 11/12/2017] cloNIDine (CATAPRES) tablet 0.1 mg  0.1 mg Oral BH-qamhs Rankin, Shuvon B, NP       Followed by  . [START ON 11/15/2017] cloNIDine (CATAPRES) tablet 0.1 mg  0.1 mg Oral QAC breakfast Rankin, Shuvon B, NP      . dicyclomine (BENTYL) tablet 20 mg  20 mg Oral Q6H PRN Rankin, Shuvon B, NP      . doxepin (SINEQUAN) capsule 25 mg  25 mg Oral QHS Rankin, Shuvon B, NP   25 mg at 11/10/17 2105  . fluvoxaMINE (LUVOX) tablet 100 mg  100 mg Oral QHS Rankin, Shuvon B, NP   100 mg at 11/10/17 2104  . hydrOXYzine (ATARAX/VISTARIL) tablet 50 mg  50 mg Oral TID PRN Rankin, Shuvon B, NP      . insulin aspart (novoLOG) injection 4 Units  4 Units Subcutaneous TID WC Rankin, Shuvon B, NP   4 Units at 11/11/17 0611  . insulin aspart protamine- aspart (NOVOLOG MIX 70/30) injection 18 Units  18 Units Subcutaneous BID WC Rankin, Shuvon B, NP   18 Units at 11/11/17 0820  . lisinopril (PRINIVIL,ZESTRIL) tablet 10 mg  10 mg Oral Daily Rankin, Shuvon B, NP   10 mg at 11/11/17 0816  . loperamide (IMODIUM) capsule 2-4 mg  2-4 mg Oral PRN Rankin, Shuvon B, NP      . metFORMIN (GLUCOPHAGE) tablet 500 mg  500 mg Oral BID WC Rankin, Shuvon B, NP   500 mg at 11/11/17 0816  . methocarbamol (ROBAXIN) tablet 500 mg  500 mg Oral  Q8H PRN Rankin, Shuvon B, NP      . naproxen (NAPROSYN) tablet 500 mg  500 mg Oral BID PRN Rankin, Shuvon B, NP      . Oxcarbazepine (TRILEPTAL) tablet 300 mg  300 mg Oral BID Rankin, Shuvon B, NP   300 mg at 11/11/17 0816  . prazosin (MINIPRESS) capsule 2 mg  2 mg Oral BID AC & HS Rankin, Shuvon B, NP   2 mg at 11/11/17 0816  . zolpidem (AMBIEN) tablet 10 mg  10 mg Oral QHS Rankin, Shuvon B, NP   10 mg at 11/10/17 2104   PTA Medications: Medications Prior to Admission  Medication Sig Dispense Refill Last Dose  . ARIPiprazole (ABILIFY) 20 MG tablet Take 1 tablet (20 mg total) by mouth daily. 30 tablet 1 11/08/2017  . [START ON 11/21/2017] ARIPiprazole ER (ABILIFY MAINTENA) 400 MG SRER injection Inject 2 mLs (400 mg total) into the muscle every 28 (twenty-eight) days. 1 each 1 Past Month at Unknown time  . blood glucose meter kit and supplies KIT Dispense based on patient and insurance preference. Use up to four times daily as directed. (FOR ICD-9 250.00, 250.01). 1 each 0   .  doxepin (SINEQUAN) 25 MG capsule Take 1 capsule (25 mg total) by mouth at bedtime. 30 capsule 1 11/08/2017  . fluvoxaMINE (LUVOX) 100 MG tablet Take 1 tablet (100 mg total) by mouth at bedtime. 30 tablet 1 11/08/2017  . hydrOXYzine (ATARAX/VISTARIL) 50 MG tablet Take 1 tablet (50 mg total) by mouth 3 (three) times daily as needed for anxiety. 90 tablet 1 11/08/2017  . ibuprofen (ADVIL,MOTRIN) 200 MG tablet Take 200 mg by mouth every 6 (six) hours as needed for headache.   unk at prn  . insulin aspart protamine- aspart (NOVOLOG MIX 70/30) (70-30) 100 UNIT/ML injection Inject 0.18 mLs (18 Units total) into the skin 2 (two) times daily with a meal. 10 mL 11 11/08/2017  . lisinopril (PRINIVIL,ZESTRIL) 10 MG tablet Take 1 tablet (10 mg total) by mouth daily. 30 tablet 1 11/08/2017  . metFORMIN (GLUCOPHAGE) 500 MG tablet Take 1 tablet (500 mg total) by mouth 2 (two) times daily with a meal. 60 tablet 1 11/08/2017  . naloxone (NARCAN) nasal  spray 4 mg/0.1 mL Use as directed in overdose 2 kit 1   . Oxcarbazepine (TRILEPTAL) 300 MG tablet Take 1 tablet (300 mg total) by mouth 2 (two) times daily. 60 tablet 1 11/08/2017  . prazosin (MINIPRESS) 2 MG capsule Take 1 capsule (2 mg total) by mouth 2 (two) times daily at 8 am and 10 pm. 60 capsule 1 11/08/2017  . zolpidem (AMBIEN) 10 MG tablet Take 1 tablet (10 mg total) by mouth at bedtime. 30 tablet 0 11/08/2017    Patient Stressors: Marital or family conflict Substance abuse  Patient Strengths: Ability for insight Average or above average intelligence Capable of independent living [employer] of knowledge Motivation for treatment/growth  Treatment Modalities: Medication Management, Group therapy, Case management,  1 to 1 session with clinician, Psychoeducation, Recreational therapy.   Physician Treatment Plan for Primary Diagnosis: MDD, recurrent, severe  Medication Management: Evaluate patient's response, side effects, and tolerance of medication regimen.  Therapeutic Interventions: 1 to 1 sessions, Unit Group sessions and Medication administration.  Evaluation of Outcomes: Not Met  Physician Treatment Plan for Secondary Diagnosis: Active Problems:   MDD (major depressive disorder), severe ([facility])   Medication Management: Evaluate patient's response, side effects, and tolerance of medication regimen.  Therapeutic Interventions: 1 to 1 sessions, Unit Group sessions and Medication administration.  Evaluation of Outcomes: Not Met   RN Treatment Plan for Primary Diagnosis: MDD, recurrent, severe Long Term Goal(s): Knowledge of disease and therapeutic regimen to maintain health will improve  Short Term Goals: Ability to remain free from injury will improve, Ability to verbalize frustration and anger appropriately will improve and Ability to identify and develop effective coping behaviors will improve  Medication Management: RN will administer medications as ordered by  provider, will assess and evaluate patient's response and provide education to patient for prescribed medication. RN will report any adverse and/or side effects to prescribing provider.  Therapeutic Interventions: 1 on 1 counseling sessions, Psychoeducation, Medication administration, Evaluate responses to treatment, Monitor vital signs and CBGs as ordered, Perform/monitor CIWA, COWS, AIMS and Fall Risk screenings as ordered, Perform wound care treatments as ordered.  Evaluation of Outcomes: Progressing   LCSW Treatment Plan for Primary Diagnosis: MDD, recurrent, severe Long Term Goal(s): Safe transition to appropriate next level of care at discharge, Engage patient in therapeutic group addressing interpersonal concerns.  Short Term Goals: Engage patient in aftercare planning with referrals and resources, Facilitate patient progression through stages of change regarding substance use  diagnoses and concerns and Identify triggers associated with mental health/substance abuse issues  Therapeutic Interventions: Assess for all discharge needs, 1 to 1 time with Social worker, Explore available resources and support systems, Assess for adequacy in community support network, Educate family and significant other(s) on suicide prevention, Complete Psychosocial Assessment, Interpersonal group therapy.  Evaluation of Outcomes: Progressing   Progress in Treatment: Attending groups: Yes. Participating in groups: Yes. Taking medication as prescribed: Yes. Toleration medication: Yes. Family/Significant other contact made: No, will contact:  family member if pt consents to collateral contact.  Patient understands diagnosis: Yes. Discussing patient identified problems/goals with staff: Yes. Medical problems stabilized or resolved: Yes. Denies suicidal/homicidal ideation: Yes. Issues/concerns per patient self-inventory: No. Other: n/a   New problem(s) identified: No, Describe:  n/a  New Short Term/Long  Term Goal(s): detox, medication management for mood stabilization; elimination of SI thoughts; development of comprehensive mental wellness/sobriety plan.   Patient Goal: "to get treated for suicidal thoughts and depression."   Discharge Plan or Barriers: CSW assessing for appropriate referrals. [place] pamphlet and AA/NA information to be provided to pt for additional community support.   Reason for Continuation of Hospitalization: Anxiety Depression Medication stabilization Suicidal ideation Homicidal ideation Withdrawal symptoms  Estimated Length of Stay: Thursday, 11/14/17  Attendees: Patient: Ronald Hampton 11/11/2017 10:03 AM  Physician: Dr. Mallie Darting MD; Dr. Nancy Fetter MD 11/11/2017 10:03 AM  Nursing: Rise Paganini RN; Opal Sidles RN 11/11/2017 10:03 AM  RN Care Manager:x 11/11/2017 10:03 AM  Social Worker: Press photographer, LCSW 11/11/2017 10:03 AM  Recreational Therapist: x 11/11/2017 10:03 AM  Other: Lindell Spar NP; Darnelle Maffucci Money NP 11/11/2017 10:03 AM  Other:  11/11/2017 10:03 AM  Other: 11/11/2017 10:03 AM    Scribe for Treatment Team: [place], LCSW 11/11/2017 10:03 AM

## 2017-11-12 DIAGNOSIS — R45850 Homicidal ideations: Secondary | ICD-10-CM

## 2017-11-12 DIAGNOSIS — Z62810 Personal history of physical and sexual abuse in childhood: Secondary | ICD-10-CM

## 2017-11-12 DIAGNOSIS — F315 Bipolar disorder, current episode depressed, severe, with psychotic features: Secondary | ICD-10-CM

## 2017-11-12 DIAGNOSIS — F419 Anxiety disorder, unspecified: Secondary | ICD-10-CM

## 2017-11-12 DIAGNOSIS — R45851 Suicidal ideations: Secondary | ICD-10-CM

## 2017-11-12 DIAGNOSIS — F4310 Post-traumatic stress disorder, unspecified: Secondary | ICD-10-CM

## 2017-11-12 DIAGNOSIS — F1120 Opioid dependence, uncomplicated: Secondary | ICD-10-CM

## 2017-11-12 DIAGNOSIS — R450 Nervousness: Secondary | ICD-10-CM

## 2017-11-12 LAB — GLUCOSE, CAPILLARY
Glucose-Capillary: 188 mg/dL — ABNORMAL HIGH (ref 65–99)
Glucose-Capillary: 167 mg/dL — ABNORMAL HIGH (ref 65–99)
Glucose-Capillary: 185 mg/dL — ABNORMAL HIGH (ref 65–99)
Glucose-Capillary: 164 mg/dL — ABNORMAL HIGH (ref 65–99)
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
## 2017-11-12 NOTE — Progress Notes (Signed)
[facility] Group Notes:  (Nursing/MHT/Case Management/Adjunct)  Date:  11/12/2017  Time:  2045  Type of Therapy:  wrap up group  Participation Level:  Active  Participation Quality:  Appropriate, Attentive, Sharing and Supportive  Affect:  Flat  Cognitive:  Appropriate  Insight:  Improving  Engagement in Group:  Engaged  Modes of Intervention:  Clarification, Education and Support  Summary of Progress/Problems: Pt shared that this is his first group he has attended and glad to be a part of it. If patient could change any one thing about his life it would be to bring his son Eulas Post back after loosing him to SIDS 7 years ago. Pt is grateful for his fiance whom he refers to as a " ride or die."  Shellia Cleverly 11/12/2017, 10:37 PM

## 2017-11-12 NOTE — [facility] Group Notes (Signed)
Pt was invited but did not attend orientation/goals group. 

## 2017-11-12 NOTE — Progress Notes (Signed)
Recreation Therapy Notes  Animal-Assisted Activity (AAA) Program Checklist/Progress Notes Patient Eligibility Criteria Checklist & Daily Group note for Rec Tx Intervention  Date: 4.30.19 Time: 7 Location: [address]  AAA/T Program Assumption of Risk Form signed by Teacher, music or Parent Legal Guardian YES   Patient is free of allergies or sever asthma YES   Patient reports no fear of animals YES   Patient reports no history of cruelty to animals YES   Patient understands his/her participation is voluntary YES   Patient washes hands before animal contact YES   Patient washes hands after animal contact YES   Behavioral Response: Engaged  Education: Contractor, Appropriate Animal Interaction   Education Outcome: Acknowledges understanding/In group clarification offered/Needs additional education.   Clinical Observations/Feedback: Pt attended and participated in activity.    Victorino Sparrow, LRT/CTRS         Ria Comment, Frida Wahlstrom A 11/12/2017 2:50 PM

## 2017-11-12 NOTE — Progress Notes (Signed)
Pt reports that his day has been better than yesterday.  He says he is still having withdrawal symptoms and also feels his blood sugar may be high because he feels "sweaty".  Suggested to pt that his diaphoresis may be from his withdrawal symptoms.  His CBG was 164 this evening.  Pt denies SI/HI/AVH at this time, but states suicidal thoughts come and go.  Pt was medicated per orders and also given Robaxin at bedtime per request for cramping in his legs.  Pt has been pleasant and appropriate.  Support and encouragement offered.  Discharge plans are in process.  Safety maintained with q15 minute checks.

## 2017-11-12 NOTE — Progress Notes (Signed)
D:Pt has a flat/sad affect on the unit. He is pleasant on the unit with staff and peers. Pt reports passive si thoughts and denies hi thoughts. He contracts with staff for safety. A:Offered support, encouragement, and 15 minute checks. R:Safety maintained on the unit.

## 2017-11-13 LAB — GLUCOSE, CAPILLARY
Glucose-Capillary: 128 mg/dL — ABNORMAL HIGH (ref 65–99)
Glucose-Capillary: 108 mg/dL — ABNORMAL HIGH (ref 65–99)
Glucose-Capillary: 158 mg/dL — ABNORMAL HIGH (ref 65–99)
Glucose-Capillary: 183 mg/dL — ABNORMAL HIGH (ref 65–99)

## 2017-11-13 MED ORDER — LORATADINE 10 MG PO TABS
10.0000 mg | ORAL_TABLET | Freq: Every day | ORAL | Status: DC
  Administered 2017-11-13 – 2017-11-14 (×2): 10 mg via ORAL
  Filled 2017-11-13: qty 7
  Filled 2017-11-13 (×2): qty 1

## 2017-11-13 MED ORDER — LORATADINE 10 MG PO TABS
ORAL_TABLET | ORAL | Status: AC
  Filled 2017-11-13: qty 1

## 2017-11-13 NOTE — Progress Notes (Signed)
D: Patient observed up and visible in the milieu for AM meds. Patient states he is "ok." Forwards little information however is pleasant, cooperative.  Patient's affect flat, sad and depressed with congruent mood. Per self inventory and discussions with writer, rates depression at a 9/10, hopelessness at a 9/10 and anxiety at a 6/10. Rates sleep as good, appetite as fair, energy as low and concentration as poor.  States goal for today is to "talk to doc about my meds." Denies pain, physical complaints. No withdrawal reported, observed.   A: Medicated per orders, no prns requested, required. Level III obs in place for safety. Emotional support offered and self inventory reviewed. Encouraged completion of Suicide Safety Plan and programming participation. Discussed POC with MD, SW.  R: Patient verbalizes understanding of POC. Patient denies HI/AVH and remains safe on level III obs. Patient does denies SI "for right now" however patient states it frequently enters his mind off and on during the day. Agrees to come to staff and gives good eye contact as he says this. Will continue to monitor closely and make verbal contact frequently.

## 2017-11-13 NOTE — [facility] Group Notes (Signed)
LCSW Group Therapy Note 11/13/2017 1:16 PM  Type of Therapy and Topic: Group Therapy: Avoiding Self-Sabotaging and Enabling Behaviors  Participation Level: Did Not Attend  Description of Group:  In this group, patients will learn how to identify obstacles, self-sabotaging and enabling behaviors, as well as: what are they, why do we do them and what needs these behaviors meet. Discuss unhealthy relationships and how to have positive healthy boundaries with those that sabotage and enable. Explore aspects of self-sabotage and enabling in yourself and how to limit these self-destructive behaviors in everyday life.  Therapeutic Goals: 1. Patient will identify one obstacle that relates to self-sabotage and enabling behaviors 2. Patient will identify one personal self-sabotaging or enabling behavior they did prior to admission 3. Patient will state a plan to change the above identified behavior 4. Patient will demonstrate ability to communicate their needs through discussion and/or role play.   Summary of Patient Progress:  Invited, chose not to attend.    Therapeutic Modalities:  Cognitive Behavioral Therapy Person-Centered Therapy Motivational Interviewing   [place] Clinical Social Worker

## 2017-11-13 NOTE — Progress Notes (Signed)
Patient ID: Ronald Hampton, male   DOB: 1980-08-30, 37 y.o.   MRN: [id number]  Pt currently presents with an anxious affect and cooperative behavior. Pt reports to writer that their goal is to "go back home when I leave." Pt states "I am going to go to [facility] for outpatient." Pt reports difficulty falling asleep last night due to increase muscle pain, reports no current pain.   Pt provided with medications per providers orders. Pt's labs and vitals were monitored throughout the night. Pt given a 1:1 about emotional and mental status. Pt supported and encouraged to express concerns and questions. Pt educated on medications and physiology of withdrawal, verbal understanding expressed.   Pt's safety ensured with 15 minute and environmental checks. Pt currently denies SI although states "I had some this morning." Reports no plan while at [facility]. Pt denies HI and A/V hallucinations. Pt verbally agrees to seek staff if SI/HI or A/VH occurs tonight and to consult with staff before acting on any harmful thoughts. Will continue POC.

## 2017-11-13 NOTE — Plan of Care (Signed)
Patient verbalizes understanding of education, information provided.

## 2017-11-13 NOTE — Progress Notes (Signed)
Recreation Therapy Notes  Date: 5.1.19 Time: 0930 Location: [address] Day[address]  Group Topic: Stress Management  Goal Area(s) Addresses:  Patient will verbalize importance of using healthy stress management.  Patient will identify positive emotions associated with healthy stress management.   Behavioral Response: Engaged  Intervention: Stress Management  Activity :  Progressive Muscle Relaxation.  LRT introduced the stress management technique of PMR.  LRT led group in tensing and then relaxing each muscle.  Patients followed along to engage in activity.  Education:  Stress Management, Discharge Planning.   Education Outcome: Acknowledges edcuation/In group clarification offered/Needs additional education  Clinical Observations/Feedback: Pt attended and participated in group.    Victorino Sparrow, LRT/CTRS         Ria Comment, Eyonna Sandstrom A 11/13/2017 11:15 AM
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
## 2017-11-14 LAB — GLUCOSE, CAPILLARY
Glucose-Capillary: 147 mg/dL — ABNORMAL HIGH (ref 65–99)
Glucose-Capillary: 106 mg/dL — ABNORMAL HIGH (ref 65–99)
Glucose-Capillary: 117 mg/dL — ABNORMAL HIGH (ref 65–99)
Glucose-Capillary: 149 mg/dL — ABNORMAL HIGH (ref 65–99)

## 2017-11-14 MED ORDER — BLOOD GLUCOSE MONITOR KIT: PACK | 0 refills | Status: DC

## 2017-11-14 MED ORDER — ZOLPIDEM TARTRATE 10 MG PO TABS: 10.0000 mg | ORAL_TABLET | Freq: Every day | ORAL | 0 refills | Status: DC

## 2017-11-14 MED ORDER — INSULIN ASPART PROT & ASPART (70-30 MIX) 100 UNIT/ML ~~LOC~~ SUSP: 18.0000 [IU] | Freq: Two times a day (BID) | SUBCUTANEOUS | 0 refills | Status: DC

## 2017-11-14 MED ORDER — DOXEPIN HCL 25 MG PO CAPS: 25.0000 mg | ORAL_CAPSULE | Freq: Every day | ORAL | 0 refills | Status: DC

## 2017-11-14 MED ORDER — LORATADINE 10 MG PO TABS: 10.0000 mg | ORAL_TABLET | Freq: Every day | ORAL | Status: DC

## 2017-11-14 MED ORDER — CLONIDINE HCL 0.1 MG PO TABS: 0.1000 mg | ORAL_TABLET | Freq: Every day | ORAL | 0 refills | Status: DC

## 2017-11-14 MED ORDER — INSULIN ASPART 100 UNIT/ML ~~LOC~~ SOLN: SUBCUTANEOUS | 0 refills | Status: DC

## 2017-11-14 MED ORDER — ARIPIPRAZOLE ER 400 MG IM SRER: 400.0000 mg | INTRAMUSCULAR | 1 refills | Status: DC

## 2017-11-14 MED ORDER — HYDROXYZINE HCL 50 MG PO TABS: 50.0000 mg | ORAL_TABLET | Freq: Three times a day (TID) | ORAL | 0 refills | Status: DC | PRN

## 2017-11-14 MED ORDER — DIVALPROEX SODIUM 500 MG PO DR TAB: 500.0000 mg | DELAYED_RELEASE_TABLET | Freq: Two times a day (BID) | ORAL | 0 refills | Status: DC

## 2017-11-14 MED ORDER — LISINOPRIL 10 MG PO TABS: 10.0000 mg | ORAL_TABLET | Freq: Every day | ORAL | 0 refills | Status: DC

## 2017-11-14 MED ORDER — ARIPIPRAZOLE 20 MG PO TABS: 20.0000 mg | ORAL_TABLET | Freq: Every day | ORAL | 0 refills | Status: DC

## 2017-11-14 MED ORDER — PRAZOSIN HCL 2 MG PO CAPS: 2.0000 mg | ORAL_CAPSULE | Freq: Two times a day (BID) | ORAL | 0 refills | Status: DC

## 2017-11-14 MED ORDER — OXCARBAZEPINE 300 MG PO TABS: 300.0000 mg | ORAL_TABLET | Freq: Two times a day (BID) | ORAL | 0 refills | Status: DC

## 2017-11-14 MED ORDER — FLUVOXAMINE MALEATE 100 MG PO TABS: 100.0000 mg | ORAL_TABLET | Freq: Every day | ORAL | 0 refills | Status: DC

## 2017-11-14 MED ORDER — METFORMIN HCL 500 MG PO TABS: 500.0000 mg | ORAL_TABLET | Freq: Two times a day (BID) | ORAL | 0 refills | Status: DC

## 2017-11-14 NOTE — BHH Group Notes (Signed)
Adult Psychoeducational Group Note  Date:  11/14/2017 Time:  2:27 PM  Group Topic/Focus:  Goals Group:   The focus of this group is to help patients establish daily goals to achieve during treatment and discuss how the patient can incorporate goal setting into their daily lives to aide in recovery.  Participation Level:  Active  Participation Quality:  Appropriate  Affect:  Appropriate  Cognitive:  Appropriate  Insight: Appropriate  Engagement in Group:  Engaged  Modes of Intervention:  Orientation  Additional Comments:  Pt goal for today is to get discharged.  Huel Cote 11/14/2017, 2:27 PM

## 2017-11-14 NOTE — Progress Notes (Signed)
Pt attended NA group for wrap up group this evening.
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
## 2017-11-14 NOTE — Discharge Summary (Signed)
Physician Discharge Summary Note  Patient:  Ronald Hampton is an 37 y.o., male  MRN:  [id number]  DOB:  1980/12/04  Patient phone:  [phone] (home)   Patient address:   [address] [place] [address],   Total Time spent with patient: Greater than 30 minutesi  Date of Admission:  11/10/2017  Date of Discharge: 11/13/2017  Reason for Admission: Suicidal ideations with plan to hang himself, HI, worsening depression, and relapse of use of heroin.    Principal Problem: Bipolar I disorder, current or most recent episode depressed, with psychotic features Ronald Hampton)  Discharge Diagnoses: Patient Active Problem List   Diagnosis Date Noted  . MDD (major depressive disorder), severe ([place]) [F32.2] 11/10/2017  . DM (diabetes mellitus), type 2, uncontrolled ([place]) [E11.65] 10/23/2017  . HTN (hypertension) [I10] 10/23/2017  . OCD (obsessive compulsive disorder) [F42.9] 10/23/2017  . Bipolar I disorder, current or most recent episode depressed, with psychotic features ([place]) [F31.5] 10/22/2017  . PTSD (post-traumatic stress disorder) [F43.10] 10/22/2017  . Opioid use disorder, moderate, dependence ([place]) [F11.20] 10/22/2017  . Suicidal ideation [R45.851] 10/22/2017    Past Medical History:  Past Medical History:  Diagnosis Date  . Bipolar 1 disorder ([place])   . Depression   . PTSD (post-traumatic stress disorder)     Past Surgical History:  Procedure Laterality Date  . HERNIA REPAIR    . Right Foot Surgery     Family History: History reviewed. No pertinent family history.  Social History:  Social History   Substance and Sexual Activity  Alcohol Use Not Currently     Social History   Substance and Sexual Activity  Drug Use Yes  . Types: Heroin    Social History   Socioeconomic History  . Marital status: Legally Separated    Spouse name: Not on file  . Number of children: Not on file  . Years of education: Not on file  . Highest education level: Not on file   Occupational History  . Not on file  Social Needs  . Financial resource strain: Not on file  . Food insecurity:    Worry: Not on file    Inability: Not on file  . Transportation needs:    Medical: Not on file    Non-medical: Not on file  Tobacco Use  . Smoking status: Never Smoker  . Smokeless tobacco: Never Used  Substance and Sexual Activity  . Alcohol use: Not Currently  . Drug use: Yes    Types: Heroin  . Sexual activity: Yes  Lifestyle  . Physical activity:    Days per week: Not on file    Minutes per session: Not on file  . Stress: Not on file  Relationships  . Social connections:    Talks on phone: Not on file    Gets together: Not on file    Attends religious service: Not on file    Active member of club or organization: Not on file    Attends meetings of clubs or organizations: Not on file    Relationship status: Not on file  Other Topics Concern  . Not on file  Social History Narrative  . Not on file   [facility] Course:  (Per Md's discharge SRA): Ronald Hampton is a 37 y/o M with history of bipolar I, PTSD, and opiate use disorder (IV heroin) who was [facility] from [facility] with SI with plan to hang himself, HI, worsening depression, and relapse of use of heroin. Pt had written a  suicide note and had rope that he was preparing to use in suicide attempt. Pt has recent relevant history of discharge from [name] inpatient unit on 10/28/17. He was transferred to [facility] for additional treatment and stabilization. He was restarted on previous medications of abilify, doxepin, ambien, and prazosin. He was discontinued from trileptal and started on trial of depakote. He was started on opioid withdrawal protocol with clonidine. Pt reported improvement of his presenting mood symptoms during his stay.  Besides the use of Clonidine detox protocols for opioid detox, Abilify 20 mg for mood control, Depakote DR 500 mg for mood stabilization, Doxepin 25 mg for  insomnia, Minipress 2 mg for PTSD & Ambien for 10 mg for insomnia, [name] received & was discharged on; Luvox 100 mg for depression/OCD & Hydroxyzine 50 mg prn for anxiety. He was also resumed & discharged on his pertinent home medications for his other pre-existing medical issues. He tolerated his treatment regimen without any adverse effects or reactions reported. Ronald Hampton was enrolled & participated in the group counseling sessions being offered & held on this unit. He learned coping skills.  Today upon his discharge interview with attending psychiatist, pt shares, "I'm doing good." He denies any specific concerns. He reports that he has been sleeping well despite RN staff reporting minimal sleep last night. His appetite is good. He denies other physical complaints. He denies SI/HI/AH/VH. He reports that his mood has improved significantly during his stay. He is tolerating his medications well and he is in agreement to continue taking his current regimen without changes. He plans to follow up at [name] after discharge. He was able to engage in safety planning including plan to return to [facility] or contact emergency services if he feels unable to maintain his own safety or the safety of others. Pt had no further questions, comments, or concerns.  Upon discharge, Ronald Hampton presents mentally & medically stable. He will continue mental health care, substance abuse treatments & medical care on an outpatient basis as noted below. He is provided with all the necessary information needed to make these appointments without problems. He received from the [facility], a 7 days worth supply samples of his [facility] discharge medications including some diabetes management supplies. He left [facility] with all personal belongs in no apparent distress. Transportation per girlfriend.  Physical Findings: AIMS: Facial and Oral Movements Muscles of Facial Expression: None, normal Lips and Perioral Area: None, normal Jaw: None,  normal Tongue: None, normal,Extremity Movements Upper (arms, wrists, hands, fingers): None, normal Lower (legs, knees, ankles, toes): None, normal, Trunk Movements Neck, shoulders, hips: None, normal, Overall Severity Severity of abnormal movements (highest score from questions above): None, normal Incapacitation due to abnormal movements: None, normal Patient's awareness of abnormal movements (rate only patient's report): No Awareness, Dental Status Current problems with teeth and/or dentures?: No Does patient usually wear dentures?: No  CIWA:  CIWA-Ar Total: 1 COWS:  COWS Total Score: 2  Musculoskeletal: Strength & Muscle Tone: within normal limits Gait & Station: normal Patient leans: N/A  Psychiatric Specialty Exam: Physical Exam  Nursing note and vitals reviewed. Constitutional: He appears well-developed.  HENT:  Head: Normocephalic.  Eyes: Pupils are equal, round, and reactive to light.  Neck: Normal range of motion.  Cardiovascular: Normal rate.  Respiratory: Effort normal.  GI: Soft.  Genitourinary:  Genitourinary Comments: Deferred  Musculoskeletal: Normal range of motion.  Neurological: He is alert.  Skin: Skin is warm.  Psychiatric: He has a normal mood and affect.  His speech is normal and behavior is normal. Thought content normal. Cognition and memory are normal. He expresses impulsivity.    Review of Systems  Constitutional: Negative.   HENT: Negative.   Eyes: Negative.   Respiratory: Negative.   Cardiovascular: Negative.   Gastrointestinal: Negative.   Genitourinary: Negative.   Musculoskeletal: Negative.   Skin: Negative.   Neurological: Negative.   Endo/Heme/Allergies: Negative.   Psychiatric/Behavioral: Positive for depression (Stable) and substance abuse (Hx. opioid use disorder (Stable)). Negative for hallucinations, memory loss and suicidal ideas. The patient has insomnia (Stable). The patient is not nervous/anxious.   All other systems reviewed  and are negative.   Blood pressure (!) 161/98, pulse 100, temperature 98.8 F (37.1 C), temperature source Oral, resp. rate 18, height _0  (1.753 m), weight 107 kg (236 lb).Body mass index is 34.85 kg/m.  See Md's SRA   Have you used any form of tobacco in the last 30 days? (Cigarettes, Smokeless Tobacco, Cigars, and/or Pipes): No  Has this patient used any form of tobacco in the last 30 days? (Cigarettes, Smokeless Tobacco, Cigars, and/or Pipes): N/A  Blood Alcohol level:  Lab Results  Component Value Date   ETH <10 11/09/2017   ETH <10 16/04/9603   Metabolic Disorder Labs:  Lab Results  Component Value Date   HGBA1C 11.6 (H) 10/23/2017   MPG 286.22 10/23/2017   MPG 286.22 10/22/2017   No results found for: PROLACTIN Lab Results  Component Value Date   CHOL 176 10/23/2017   TRIG 193 (H) 10/23/2017   HDL 36 (L) 10/23/2017   CHOLHDL 4.9 10/23/2017   VLDL 39 10/23/2017   LDLCALC 101 (H) 10/23/2017   See Psychiatric Specialty Exam and Suicide Risk Assessment completed by Attending Physician prior to discharge.  Discharge destination:  Home  Is patient on multiple antipsychotic therapies at discharge:  No   Has Patient had three or more failed trials of antipsychotic monotherapy by history:  No  Recommended Plan for Multiple Antipsychotic Therapies: NA  Allergies as of 11/14/2017      Reactions   Sulfa Antibiotics Anaphylaxis   Tomato Hives, Shortness Of Breath   Trazodone And Nefazodone    Zofran [ondansetron Hcl] Rash      Medication List    STOP taking these medications   ibuprofen 200 MG tablet Commonly known as:  ADVIL,MOTRIN   naloxone 4 MG/0.1ML Liqd nasal spray kit Commonly known as:  NARCAN   Oxcarbazepine 300 MG tablet Commonly known as:  TRILEPTAL     TAKE these medications     Indication  ARIPiprazole 20 MG tablet Commonly known as:  ABILIFY Take 1 tablet (20 mg total) by mouth daily. For mood control What changed:  additional  instructions  Indication:  Mood control   ARIPiprazole ER 400 MG Srer injection Commonly known as:  ABILIFY MAINTENA Inject 2 mLs (400 mg total) into the muscle every 28 (twenty-eight) days. Start taking on:  11/21/2017 What changed:  Another medication with the same name was changed. Make sure you understand how and when to take each.  Indication:  MIXED BIPOLAR AFFECTIVE DISORDER   blood glucose meter kit and supplies Kit Dispense based on patient and insurance preference. Use up to four times daily as directed. (FOR ICD-9 250.00, 250.01). ( What changed:  additional instructions  Indication:  Diabetes   divalproex 500 MG DR tablet Commonly known as:  DEPAKOTE Take 1 tablet (500 mg total) by mouth every 12 (twelve) hours. For mood stabilization  Indication:  Mood stabilization   doxepin 25 MG capsule Commonly known as:  SINEQUAN Take 1 capsule (25 mg total) by mouth at bedtime. For insomnia What changed:  additional instructions  Indication:  Insomnia   fluvoxaMINE 100 MG tablet Commonly known as:  LUVOX Take 1 tablet (100 mg total) by mouth at bedtime. For depression/OCD What changed:  additional instructions  Indication:  Major Depressive Disorder, Obsessive Compulsive Disorder   hydrOXYzine 50 MG tablet Commonly known as:  ATARAX/VISTARIL Take 1 tablet (50 mg total) by mouth 3 (three) times daily as needed for anxiety.  Indication:  Feeling Anxious   insulin aspart 100 UNIT/ML injection Commonly known as:  NOVOLOG Take 4 units three times a day before meals: For diabetes management  Indication:  Type 2 Diabetes   insulin aspart protamine- aspart (70-30) 100 UNIT/ML injection Commonly known as:  NOVOLOG MIX 70/30 Inject 0.18 mLs (18 Units total) into the skin 2 (two) times daily with a meal. For diabetes management What changed:  additional instructions  Indication:  Type 2 Diabetes   lisinopril 10 MG tablet Commonly known as:  PRINIVIL,ZESTRIL Take 1 tablet (10 mg  total) by mouth daily. For high blood pressure What changed:  additional instructions  Indication:  High Blood Pressure Disorder   loratadine 10 MG tablet Commonly known as:  CLARITIN Take 1 tablet (10 mg total) by mouth daily. (May purchase from over the counter): For allergies Start taking on:  11/15/2017  Indication:  Perennial Allergic Rhinitis, Hayfever   metFORMIN 500 MG tablet Commonly known as:  GLUCOPHAGE Take 1 tablet (500 mg total) by mouth 2 (two) times daily with a meal. For diabetes management What changed:  additional instructions  Indication:  Type 2 Diabetes   prazosin 2 MG capsule Commonly known as:  MINIPRESS Take 1 capsule (2 mg total) by mouth 2 (two) times daily at 8 am and 10 pm. For PTSD related nightmares What changed:  additional instructions  Indication:  PTSD   zolpidem 10 MG tablet Commonly known as:  AMBIEN Take 1 tablet (10 mg total) by mouth at bedtime. For sleep What changed:  additional instructions  Indication:  [place] Follow up on 11/18/2017.   Why:  Appointment is 11/18/17 at 12:15pm. Please be sure to bring your Photo ID, SSN (card), any insurance information (if you have any) and any discharge paperework from your hospitalization.  Contact information: [place] [address] [place]. Go to.   Why:  Please walk-in to the [facility] on [address] in [place], [place] Monday thru Friday b/w 09:00 am & 4:00 PM for your medical care needs. Contact information: [address] [place] [address] [phone]         Follow-up recommendations: Activity:  As tolerated Diet: As recommended by your primary care doctor. Keep all scheduled follow-up appointments as recommended.  Comments: Patient is instructed prior to discharge to: Take all medications  as prescribed by his/her mental healthcare provider. Report any adverse effects and or reactions from the medicines to his/her outpatient provider promptly. Patient has been instructed & cautioned: To not engage in alcohol and or illegal drug use while on prescription medicines. In the event of worsening symptoms, patient is instructed to call the crisis hotline, 911 and or go to  the nearest ED for appropriate evaluation and treatment of symptoms. To follow-up with his/her primary care provider for your other medical issues, concerns and or health care needs.     Signed: Lindell Spar, NP, pmhnp, fnp-bc 11/14/2017, 1:46 PM

## 2017-11-14 NOTE — Progress Notes (Signed)
  [facility] Adult Case Management Discharge Plan :  Will you be returning to the same living situation after discharge:  Yes,  patient is returning home with his mother At discharge, do you have transportation home?: Yes,  patient reports his girlfriend will pick him up at discharge Do you have the ability to pay for your medications: No.  Release of information consent forms completed and in the chart;  Patient's signature needed at discharge.  Patient to Follow up at: Follow-up [place] Follow up on 11/18/2017.   Why:  Appointment is 11/18/17 at 12:15pm. Please be sure to bring your Photo ID, SSN (card), any insurance information (if you have any) and any discharge paperework from your hospitalization.  Contact information: [place] [address] [phone]           Next level of care provider has access to [place] and Suicide Prevention discussed: Yes,  with the patient  Have you used any form of tobacco in the last 30 days? (Cigarettes, Smokeless Tobacco, Cigars, and/or Pipes): No  Has patient been referred to the Quitline?: N/A patient is not a smoker  Patient has been referred for addiction treatment: Pt. refused referral  [name], [place] 11/14/2017, 10:45 AM

## 2017-11-14 NOTE — Progress Notes (Signed)
Discharge Note:  Patient discharged home with family member.  Patient denied SI and HI.  Denied A/V hallucinations.  Suicide prevention information given and discussed with patient who stated he understood and had no questions.  Patient stated he received all his belongings, clothing, toiletries, misc items, prescriptions, medications, etc.  Patient stated he appreciated all assistance received from [facility] staff.  All required discharge information given to patient at discharge.

## 2017-11-14 NOTE — Progress Notes (Signed)
D:  Patient's self inventory sheet, patient has fair sleep, sleep medication helpful.  Good appetite, normal energy level, good concentration.  Rate depression 2, denied hopeless, anxiety 4.  Denied withdrawals.  Denied SI.  Denied physical problems.  Physical pain, headache, pain medicine helpful.  Goal is discharge.  Plans to take medication.  Does have discharge plans. A:  Medications administered per MD orders.  Emotional support and encouragement given patient. R:  Denied SI and Hi, contracts for safety.  Denied A/V hallucinations.  Safety maintained with 15 minute checks.

## 2019-09-22 ENCOUNTER — Ambulatory Visit: Payer: Self-pay | Attending: Internal Medicine

## 2019-09-22 DIAGNOSIS — Z23 Encounter for immunization: Secondary | ICD-10-CM | POA: Insufficient documentation

## 2019-09-22 NOTE — Progress Notes (Signed)
   Covid-19 Vaccination Clinic  Name:  Brevan Dimattia III    MRN: [id number] DOB: 1981/06/12  09/22/2019  Mr. Strome was observed post Covid-19 immunization for 15 minutes without incident. He was provided with Vaccine Information Sheet and instruction to access the V-Safe system.   Mr. Sittler was instructed to call 911 with any severe reactions post vaccine: Marland Kitchen Difficulty breathing  . Swelling of face and throat  . A fast heartbeat  . A bad rash all over body  . Dizziness and weakness   Immunizations Administered    Name Date Dose VIS Date Route   Pfizer COVID-19 Vaccine 09/22/2019  5:32 PM 0.3 mL 06/26/2019 Intramuscular   Manufacturer: [place]   Lot: [id number]   [place]: [id number]

## 2019-10-13 ENCOUNTER — Ambulatory Visit: Payer: Self-pay | Attending: Internal Medicine

## 2022-06-02 ENCOUNTER — Inpatient Hospital Stay (HOSPITAL_COMMUNITY)
Admission: EM | Admit: 2022-06-02 | Discharge: 2022-06-13 | DRG: 682 | Disposition: A | Discharge status: Home / Self Care | Payer: No Typology Code available for payment source | Attending: Internal Medicine | Admitting: Internal Medicine

## 2022-06-02 DIAGNOSIS — M25562 Pain in left knee: Secondary | ICD-10-CM | POA: Diagnosis present

## 2022-06-02 DIAGNOSIS — F1510 Other stimulant abuse, uncomplicated: Secondary | ICD-10-CM | POA: Diagnosis present

## 2022-06-02 DIAGNOSIS — N189 Chronic kidney disease, unspecified: Secondary | ICD-10-CM | POA: Diagnosis present

## 2022-06-02 DIAGNOSIS — F4310 Post-traumatic stress disorder, unspecified: Secondary | ICD-10-CM | POA: Diagnosis present

## 2022-06-02 DIAGNOSIS — E876 Hypokalemia: Secondary | ICD-10-CM | POA: Diagnosis present

## 2022-06-02 DIAGNOSIS — R6510 Systemic inflammatory response syndrome (SIRS) of non-infectious origin without acute organ dysfunction: Secondary | ICD-10-CM | POA: Diagnosis present

## 2022-06-02 DIAGNOSIS — Z79899 Other long term (current) drug therapy: Secondary | ICD-10-CM

## 2022-06-02 DIAGNOSIS — Z888 Allergy status to other drugs, medicaments and biological substances status: Secondary | ICD-10-CM

## 2022-06-02 DIAGNOSIS — Z8614 Personal history of Methicillin resistant Staphylococcus aureus infection: Secondary | ICD-10-CM

## 2022-06-02 DIAGNOSIS — Z7984 Long term (current) use of oral hypoglycemic drugs: Secondary | ICD-10-CM

## 2022-06-02 DIAGNOSIS — B182 Chronic viral hepatitis C: Secondary | ICD-10-CM | POA: Diagnosis present

## 2022-06-02 DIAGNOSIS — Z87898 Personal history of other specified conditions: Secondary | ICD-10-CM

## 2022-06-02 DIAGNOSIS — Z981 Arthrodesis status: Secondary | ICD-10-CM

## 2022-06-02 DIAGNOSIS — Z8673 Personal history of transient ischemic attack (TIA), and cerebral infarction without residual deficits: Secondary | ICD-10-CM

## 2022-06-02 DIAGNOSIS — U071 COVID-19: Secondary | ICD-10-CM | POA: Diagnosis present

## 2022-06-02 DIAGNOSIS — L8990 Pressure ulcer of unspecified site, unspecified stage: Secondary | ICD-10-CM | POA: Insufficient documentation

## 2022-06-02 DIAGNOSIS — R262 Difficulty in walking, not elsewhere classified: Secondary | ICD-10-CM | POA: Diagnosis present

## 2022-06-02 DIAGNOSIS — W19XXXA Unspecified fall, initial encounter: Secondary | ICD-10-CM | POA: Diagnosis present

## 2022-06-02 DIAGNOSIS — L89151 Pressure ulcer of sacral region, stage 1: Secondary | ICD-10-CM | POA: Diagnosis present

## 2022-06-02 DIAGNOSIS — N1832 Chronic kidney disease, stage 3b: Secondary | ICD-10-CM | POA: Diagnosis present

## 2022-06-02 DIAGNOSIS — M1712 Unilateral primary osteoarthritis, left knee: Secondary | ICD-10-CM | POA: Diagnosis present

## 2022-06-02 DIAGNOSIS — Z9181 History of falling: Secondary | ICD-10-CM

## 2022-06-02 DIAGNOSIS — Z794 Long term (current) use of insulin: Secondary | ICD-10-CM

## 2022-06-02 DIAGNOSIS — N179 Acute kidney failure, unspecified: Secondary | ICD-10-CM | POA: Diagnosis not present

## 2022-06-02 DIAGNOSIS — F444 Conversion disorder with motor symptom or deficit: Secondary | ICD-10-CM | POA: Diagnosis present

## 2022-06-02 DIAGNOSIS — T364X6A Underdosing of tetracyclines, initial encounter: Secondary | ICD-10-CM | POA: Diagnosis present

## 2022-06-02 DIAGNOSIS — G4700 Insomnia, unspecified: Secondary | ICD-10-CM | POA: Diagnosis not present

## 2022-06-02 DIAGNOSIS — K5900 Constipation, unspecified: Secondary | ICD-10-CM | POA: Diagnosis present

## 2022-06-02 DIAGNOSIS — F1910 Other psychoactive substance abuse, uncomplicated: Secondary | ICD-10-CM

## 2022-06-02 DIAGNOSIS — R161 Splenomegaly, not elsewhere classified: Secondary | ICD-10-CM | POA: Diagnosis present

## 2022-06-02 DIAGNOSIS — Z87440 Personal history of urinary (tract) infections: Secondary | ICD-10-CM

## 2022-06-02 DIAGNOSIS — Z882 Allergy status to sulfonamides status: Secondary | ICD-10-CM

## 2022-06-02 DIAGNOSIS — D649 Anemia, unspecified: Secondary | ICD-10-CM | POA: Diagnosis present

## 2022-06-02 DIAGNOSIS — F319 Bipolar disorder, unspecified: Secondary | ICD-10-CM | POA: Diagnosis present

## 2022-06-02 DIAGNOSIS — E1122 Type 2 diabetes mellitus with diabetic chronic kidney disease: Secondary | ICD-10-CM | POA: Diagnosis present

## 2022-06-02 DIAGNOSIS — R509 Fever, unspecified: Principal | ICD-10-CM

## 2022-06-02 DIAGNOSIS — Z91128 Patient's intentional underdosing of medication regimen for other reason: Secondary | ICD-10-CM

## 2022-06-02 DIAGNOSIS — S025XXA Fracture of tooth (traumatic), initial encounter for closed fracture: Secondary | ICD-10-CM | POA: Diagnosis present

## 2022-06-02 DIAGNOSIS — G7281 Critical illness myopathy: Secondary | ICD-10-CM | POA: Diagnosis present

## 2022-06-02 DIAGNOSIS — R7401 Elevation of levels of liver transaminase levels: Secondary | ICD-10-CM | POA: Diagnosis present

## 2022-06-02 DIAGNOSIS — Z8739 Personal history of other diseases of the musculoskeletal system and connective tissue: Secondary | ICD-10-CM

## 2022-06-02 DIAGNOSIS — M545 Low back pain, unspecified: Secondary | ICD-10-CM | POA: Diagnosis present

## 2022-06-02 DIAGNOSIS — K029 Dental caries, unspecified: Secondary | ICD-10-CM | POA: Diagnosis present

## 2022-06-02 DIAGNOSIS — Z8547 Personal history of malignant neoplasm of testis: Secondary | ICD-10-CM

## 2022-06-02 DIAGNOSIS — D472 Monoclonal gammopathy: Secondary | ICD-10-CM | POA: Diagnosis present

## 2022-06-02 DIAGNOSIS — M25561 Pain in right knee: Secondary | ICD-10-CM | POA: Diagnosis present

## 2022-06-02 DIAGNOSIS — D631 Anemia in chronic kidney disease: Secondary | ICD-10-CM | POA: Diagnosis present

## 2022-06-02 DIAGNOSIS — F32A Depression, unspecified: Secondary | ICD-10-CM | POA: Diagnosis present

## 2022-06-02 DIAGNOSIS — E860 Dehydration: Secondary | ICD-10-CM | POA: Diagnosis present

## 2022-06-02 DIAGNOSIS — F1110 Opioid abuse, uncomplicated: Secondary | ICD-10-CM | POA: Diagnosis present

## 2022-06-02 DIAGNOSIS — R030 Elevated blood-pressure reading, without diagnosis of hypertension: Secondary | ICD-10-CM | POA: Diagnosis present

## 2022-06-02 NOTE — ED Triage Notes (Signed)
The pt arrived by  ems from the pts home  he fell 2-3 days ago from his bed he is a paraplegic   he lay in the floor for 2 days with his family coming in and out to check on him. Alert and oriented x 4   pt c/o upper and lower back pain

## 2022-06-03 ENCOUNTER — Emergency Department (HOSPITAL_COMMUNITY): Payer: No Typology Code available for payment source

## 2022-06-03 ENCOUNTER — Other Ambulatory Visit: Payer: Self-pay

## 2022-06-03 DIAGNOSIS — U071 COVID-19: Secondary | ICD-10-CM | POA: Diagnosis present

## 2022-06-03 DIAGNOSIS — E1122 Type 2 diabetes mellitus with diabetic chronic kidney disease: Secondary | ICD-10-CM | POA: Diagnosis present

## 2022-06-03 DIAGNOSIS — E860 Dehydration: Secondary | ICD-10-CM | POA: Diagnosis present

## 2022-06-03 DIAGNOSIS — B182 Chronic viral hepatitis C: Secondary | ICD-10-CM | POA: Diagnosis present

## 2022-06-03 DIAGNOSIS — F1110 Opioid abuse, uncomplicated: Secondary | ICD-10-CM | POA: Diagnosis present

## 2022-06-03 DIAGNOSIS — N179 Acute kidney failure, unspecified: Secondary | ICD-10-CM | POA: Diagnosis present

## 2022-06-03 DIAGNOSIS — N189 Chronic kidney disease, unspecified: Secondary | ICD-10-CM

## 2022-06-03 DIAGNOSIS — Z87898 Personal history of other specified conditions: Secondary | ICD-10-CM

## 2022-06-03 DIAGNOSIS — R7401 Elevation of levels of liver transaminase levels: Secondary | ICD-10-CM | POA: Diagnosis present

## 2022-06-03 DIAGNOSIS — D472 Monoclonal gammopathy: Secondary | ICD-10-CM | POA: Diagnosis present

## 2022-06-03 DIAGNOSIS — F319 Bipolar disorder, unspecified: Secondary | ICD-10-CM | POA: Diagnosis present

## 2022-06-03 DIAGNOSIS — K5900 Constipation, unspecified: Secondary | ICD-10-CM | POA: Diagnosis present

## 2022-06-03 DIAGNOSIS — M545 Low back pain, unspecified: Secondary | ICD-10-CM | POA: Diagnosis present

## 2022-06-03 DIAGNOSIS — G7281 Critical illness myopathy: Secondary | ICD-10-CM | POA: Diagnosis present

## 2022-06-03 DIAGNOSIS — M25561 Pain in right knee: Secondary | ICD-10-CM | POA: Diagnosis present

## 2022-06-03 DIAGNOSIS — Z794 Long term (current) use of insulin: Secondary | ICD-10-CM | POA: Diagnosis not present

## 2022-06-03 DIAGNOSIS — N1832 Chronic kidney disease, stage 3b: Secondary | ICD-10-CM | POA: Diagnosis present

## 2022-06-03 DIAGNOSIS — F444 Conversion disorder with motor symptom or deficit: Secondary | ICD-10-CM | POA: Diagnosis present

## 2022-06-03 DIAGNOSIS — F4310 Post-traumatic stress disorder, unspecified: Secondary | ICD-10-CM | POA: Diagnosis present

## 2022-06-03 DIAGNOSIS — Z87440 Personal history of urinary (tract) infections: Secondary | ICD-10-CM | POA: Diagnosis not present

## 2022-06-03 DIAGNOSIS — W19XXXA Unspecified fall, initial encounter: Secondary | ICD-10-CM | POA: Diagnosis not present

## 2022-06-03 DIAGNOSIS — M25562 Pain in left knee: Secondary | ICD-10-CM | POA: Diagnosis present

## 2022-06-03 DIAGNOSIS — F1510 Other stimulant abuse, uncomplicated: Secondary | ICD-10-CM | POA: Diagnosis present

## 2022-06-03 DIAGNOSIS — Z9181 History of falling: Secondary | ICD-10-CM | POA: Diagnosis not present

## 2022-06-03 DIAGNOSIS — F1910 Other psychoactive substance abuse, uncomplicated: Secondary | ICD-10-CM

## 2022-06-03 DIAGNOSIS — Z8673 Personal history of transient ischemic attack (TIA), and cerebral infarction without residual deficits: Secondary | ICD-10-CM

## 2022-06-03 DIAGNOSIS — F32A Depression, unspecified: Secondary | ICD-10-CM | POA: Diagnosis present

## 2022-06-03 DIAGNOSIS — R6510 Systemic inflammatory response syndrome (SIRS) of non-infectious origin without acute organ dysfunction: Secondary | ICD-10-CM | POA: Diagnosis not present

## 2022-06-03 DIAGNOSIS — D649 Anemia, unspecified: Secondary | ICD-10-CM | POA: Diagnosis present

## 2022-06-03 DIAGNOSIS — L89151 Pressure ulcer of sacral region, stage 1: Secondary | ICD-10-CM | POA: Diagnosis present

## 2022-06-03 DIAGNOSIS — Z8739 Personal history of other diseases of the musculoskeletal system and connective tissue: Secondary | ICD-10-CM

## 2022-06-03 DIAGNOSIS — D631 Anemia in chronic kidney disease: Secondary | ICD-10-CM | POA: Diagnosis present

## 2022-06-03 DIAGNOSIS — E876 Hypokalemia: Secondary | ICD-10-CM | POA: Diagnosis present

## 2022-06-03 LAB — COMPREHENSIVE METABOLIC PANEL
ALT: 52 U/L — ABNORMAL HIGH (ref 0–44)
AST: 49 U/L — ABNORMAL HIGH (ref 15–41)
Albumin: 3.9 g/dL (ref 3.5–5.0)
Alkaline Phosphatase: 53 U/L (ref 38–126)
Anion gap: 17 — ABNORMAL HIGH (ref 5–15)
BUN: 43 mg/dL — ABNORMAL HIGH (ref 6–20)
CO2: 24 mmol/L (ref 22–32)
Calcium: 11.3 mg/dL — ABNORMAL HIGH (ref 8.9–10.3)
Chloride: 97 mmol/L — ABNORMAL LOW (ref 98–111)
Creatinine, Ser: 2.35 mg/dL — ABNORMAL HIGH (ref 0.61–1.24)
GFR, Estimated: 35 mL/min — ABNORMAL LOW (ref >60)
Glucose, Bld: 82 mg/dL (ref 70–99)
Potassium: 3.1 mmol/L — ABNORMAL LOW (ref 3.5–5.1)
Sodium: 138 mmol/L (ref 135–145)
Total Bilirubin: 1.9 mg/dL — ABNORMAL HIGH (ref 0.3–1.2)
Total Protein: 8.7 g/dL — ABNORMAL HIGH (ref 6.5–8.1)

## 2022-06-03 LAB — URINALYSIS, ROUTINE W REFLEX MICROSCOPIC
Bacteria, UA: NONE SEEN
Bilirubin Urine: NEGATIVE
Glucose, UA: NEGATIVE mg/dL
Ketones, ur: NEGATIVE mg/dL
Leukocytes,Ua: NEGATIVE
Nitrite: NEGATIVE
Protein, ur: NEGATIVE mg/dL
Specific Gravity, Urine: 1.021 (ref 1.005–1.030)
pH: 5 (ref 5.0–8.0)

## 2022-06-03 LAB — CBC WITH DIFFERENTIAL/PLATELET
Abs Immature Granulocytes: 0.04 10*3/uL (ref 0.00–0.07)
Basophils Absolute: 0 10*3/uL (ref 0.0–0.1)
Basophils Relative: 0 %
Eosinophils Absolute: 0.2 10*3/uL (ref 0.0–0.5)
Eosinophils Relative: 2 %
HCT: 36 % — ABNORMAL LOW (ref 39.0–52.0)
Hemoglobin: 12.5 g/dL — ABNORMAL LOW (ref 13.0–17.0)
Immature Granulocytes: 0 %
Lymphocytes Relative: 19 %
Lymphs Abs: 2 10*3/uL (ref 0.7–4.0)
MCH: 29.3 pg (ref 26.0–34.0)
MCHC: 34.7 g/dL (ref 30.0–36.0)
MCV: 84.3 fL (ref 80.0–100.0)
Monocytes Absolute: 0.8 10*3/uL (ref 0.1–1.0)
Monocytes Relative: 8 %
Neutro Abs: 7.3 10*3/uL (ref 1.7–7.7)
Neutrophils Relative %: 71 %
Platelets: 153 10*3/uL (ref 150–400)
RBC: 4.27 MIL/uL (ref 4.22–5.81)
RDW: 14.9 % (ref 11.5–15.5)
WBC: 10.4 10*3/uL (ref 4.0–10.5)
nRBC: 0 % (ref 0.0–0.2)

## 2022-06-03 LAB — RAPID URINE DRUG SCREEN, HOSP PERFORMED
Amphetamines: POSITIVE — AB
Barbiturates: NOT DETECTED
Benzodiazepines: NOT DETECTED
Cocaine: NOT DETECTED
Opiates: POSITIVE — AB
Tetrahydrocannabinol: NOT DETECTED

## 2022-06-03 LAB — CK: Total CK: 291 U/L (ref 49–397)

## 2022-06-03 LAB — LACTIC ACID, PLASMA
Lactic Acid, Venous: 1 mmol/L (ref 0.5–1.9)
Lactic Acid, Venous: 1.6 mmol/L (ref 0.5–1.9)

## 2022-06-03 LAB — SEDIMENTATION RATE: Sed Rate: 51 mm/hr — ABNORMAL HIGH (ref 0–16)

## 2022-06-03 LAB — RESP PANEL BY RT-PCR (FLU A&B, COVID) ARPGX2
Influenza A by PCR: NEGATIVE
Influenza B by PCR: NEGATIVE
SARS Coronavirus 2 by RT PCR: POSITIVE — AB

## 2022-06-03 LAB — LIPASE, BLOOD: Lipase: 25 U/L (ref 11–51)

## 2022-06-03 LAB — C-REACTIVE PROTEIN: CRP: 8.6 mg/dL — ABNORMAL HIGH (ref <1.0)

## 2022-06-03 LAB — HIV ANTIBODY (ROUTINE TESTING W REFLEX): HIV Screen 4th Generation wRfx: NONREACTIVE

## 2022-06-03 MED ORDER — POTASSIUM CHLORIDE CRYS ER 20 MEQ PO TBCR
40.0000 meq | EXTENDED_RELEASE_TABLET | ORAL | Status: AC
  Administered 2022-06-03: 40 meq via ORAL
  Filled 2022-06-03: qty 2

## 2022-06-03 MED ORDER — SODIUM CHLORIDE 0.9 % IV SOLN
12.5000 mg | Freq: Four times a day (QID) | INTRAVENOUS | Status: DC | PRN
  Administered 2022-06-03 – 2022-06-12 (×8): 12.5 mg via INTRAVENOUS
  Filled 2022-06-03: qty 0.5
  Filled 2022-06-03: qty 12.5
  Filled 2022-06-03 (×7): qty 0.5

## 2022-06-03 MED ORDER — LIDOCAINE 5 % EX PTCH
1.0000 | MEDICATED_PATCH | CUTANEOUS | Status: DC
  Administered 2022-06-03 – 2022-06-12 (×8): 1 via TRANSDERMAL
  Filled 2022-06-03 (×9): qty 1

## 2022-06-03 MED ORDER — DULOXETINE HCL 20 MG PO CPEP
40.0000 mg | ORAL_CAPSULE | Freq: Two times a day (BID) | ORAL | Status: DC
  Administered 2022-06-03 – 2022-06-13 (×19): 40 mg via ORAL
  Filled 2022-06-03 (×20): qty 2

## 2022-06-03 MED ORDER — ACETAMINOPHEN 325 MG PO TABS
650.0000 mg | ORAL_TABLET | Freq: Four times a day (QID) | ORAL | Status: DC | PRN
  Administered 2022-06-03: 650 mg via ORAL
  Filled 2022-06-03: qty 2

## 2022-06-03 MED ORDER — POTASSIUM CHLORIDE 10 MEQ/100ML IV SOLN
10.0000 meq | Freq: Once | INTRAVENOUS | Status: AC
  Administered 2022-06-03: 10 meq via INTRAVENOUS
  Filled 2022-06-03: qty 100

## 2022-06-03 MED ORDER — VITAMIN C 500 MG PO TABS
500.0000 mg | ORAL_TABLET | Freq: Every day | ORAL | Status: DC
  Administered 2022-06-03 – 2022-06-13 (×11): 500 mg via ORAL
  Filled 2022-06-03 (×11): qty 1

## 2022-06-03 MED ORDER — HYDROCOD POLI-CHLORPHE POLI ER 10-8 MG/5ML PO SUER: 5.0000 mL | Freq: Two times a day (BID) | ORAL | Status: DC | PRN

## 2022-06-03 MED ORDER — ALBUTEROL SULFATE (2.5 MG/3ML) 0.083% IN NEBU: 2.5000 mg | INHALATION_SOLUTION | Freq: Four times a day (QID) | RESPIRATORY_TRACT | Status: DC | PRN

## 2022-06-03 MED ORDER — SENNOSIDES-DOCUSATE SODIUM 8.6-50 MG PO TABS
1.0000 | ORAL_TABLET | Freq: Two times a day (BID) | ORAL | Status: DC
  Administered 2022-06-03 – 2022-06-13 (×21): 1 via ORAL
  Filled 2022-06-03 (×21): qty 1

## 2022-06-03 MED ORDER — SODIUM CHLORIDE 0.9 % IV BOLUS
1000.0000 mL | Freq: Once | INTRAVENOUS | Status: AC
  Administered 2022-06-03: 1000 mL via INTRAVENOUS

## 2022-06-03 MED ORDER — ZINC SULFATE 220 (50 ZN) MG PO CAPS
220.0000 mg | ORAL_CAPSULE | Freq: Every day | ORAL | Status: DC
  Administered 2022-06-03 – 2022-06-13 (×11): 220 mg via ORAL
  Filled 2022-06-03 (×11): qty 1

## 2022-06-03 MED ORDER — GABAPENTIN 300 MG PO CAPS
300.0000 mg | ORAL_CAPSULE | Freq: Two times a day (BID) | ORAL | Status: DC
  Administered 2022-06-03 – 2022-06-13 (×20): 300 mg via ORAL
  Filled 2022-06-03 (×20): qty 1

## 2022-06-03 MED ORDER — SODIUM CHLORIDE 0.9 % IV SOLN: INTRAVENOUS | Status: DC

## 2022-06-03 MED ORDER — HEPARIN SODIUM (PORCINE) 5000 UNIT/ML IJ SOLN
5000.0000 [IU] | Freq: Three times a day (TID) | INTRAMUSCULAR | Status: DC
  Administered 2022-06-04 – 2022-06-13 (×25): 5000 [IU] via SUBCUTANEOUS
  Filled 2022-06-03 (×28): qty 1

## 2022-06-03 MED ORDER — HYDRALAZINE HCL 20 MG/ML IJ SOLN: 10.0000 mg | INTRAMUSCULAR | Status: DC | PRN

## 2022-06-03 MED ORDER — IOHEXOL 350 MG/ML SOLN
75.0000 mL | Freq: Once | INTRAVENOUS | Status: AC | PRN
  Administered 2022-06-03: 75 mL via INTRAVENOUS

## 2022-06-03 MED ORDER — ACETAMINOPHEN 650 MG RE SUPP: 650.0000 mg | Freq: Four times a day (QID) | RECTAL | Status: DC | PRN

## 2022-06-03 MED ORDER — ASPIRIN 81 MG PO TBEC
81.0000 mg | DELAYED_RELEASE_TABLET | Freq: Every day | ORAL | Status: DC
  Administered 2022-06-03 – 2022-06-13 (×11): 81 mg via ORAL
  Filled 2022-06-03 (×11): qty 1

## 2022-06-03 MED ORDER — GUAIFENESIN-DM 100-10 MG/5ML PO SYRP: 10.0000 mL | ORAL_SOLUTION | ORAL | Status: DC | PRN

## 2022-06-03 MED ORDER — ALBUTEROL SULFATE HFA 108 (90 BASE) MCG/ACT IN AERS: 2.0000 | INHALATION_SPRAY | Freq: Four times a day (QID) | RESPIRATORY_TRACT | Status: DC | PRN

## 2022-06-03 MED ORDER — SODIUM CHLORIDE 0.9% FLUSH
3.0000 mL | Freq: Two times a day (BID) | INTRAVENOUS | Status: DC
  Administered 2022-06-03 – 2022-06-12 (×18): 3 mL via INTRAVENOUS

## 2022-06-03 MED ORDER — DOXYCYCLINE HYCLATE 100 MG PO TABS
100.0000 mg | ORAL_TABLET | Freq: Two times a day (BID) | ORAL | Status: DC
  Administered 2022-06-03 – 2022-06-13 (×20): 100 mg via ORAL
  Filled 2022-06-03 (×20): qty 1

## 2022-06-03 MED ORDER — MORPHINE SULFATE (PF) 4 MG/ML IV SOLN
4.0000 mg | INTRAVENOUS | Status: DC | PRN
  Administered 2022-06-03 – 2022-06-09 (×21): 4 mg via INTRAVENOUS
  Filled 2022-06-03 (×22): qty 1

## 2022-06-03 MED ORDER — MOLNUPIRAVIR EUA 200MG CAPSULE
4.0000 | ORAL_CAPSULE | Freq: Two times a day (BID) | ORAL | Status: AC
  Administered 2022-06-03 – 2022-06-08 (×10): 800 mg via ORAL
  Filled 2022-06-03 (×2): qty 4

## 2022-06-03 MED ORDER — SODIUM CHLORIDE 0.9 % IV SOLN: INTRAVENOUS | Status: AC

## 2022-06-03 NOTE — ED Provider Notes (Signed)
Emergency Department Provider Note   I have reviewed the triage vital signs and the nursing notes.   HISTORY  Chief Complaint uti   HPI Ronald Hampton is a 41 y.o. male with complicated past medical history including multiple month admission in the [facility] system for spontaneous intracranial hemorrhage with hospitalization complicated by ESBL/Klebsiella UTI, cervical discitis/osteomyelitis, and multiple myeloma diagnosis presents to the ED from home after a fall.  Patient tells me that he fell from bed 2 days ago.  He is living with a family member but refused to let them call 911 or help him off the ground.  He has been drinking fluids but was having severe pain in his lower back as well as bilateral knees.  He states yesterday he had return of fever to 104 F.  He completed a course of ertapenem 2 weeks ago for ESBL and Klebsiella UTI in a nursing facility. He was discharged home where he has been for the last several days.  He tells me that in addition to the pain in his back and knees he is also having some neck discomfort.  No new neuro deficits. No cough, SOB, CP, or abdominal pain.    Past Medical History:  Diagnosis Date   Bipolar 1 disorder ([place])    Depression    PTSD (post-traumatic stress disorder)     Review of Systems  Constitutional: Positive fever/chills Cardiovascular: Denies chest pain. Respiratory: Denies shortness of breath. Gastrointestinal: No abdominal pain. Positive nausea, no vomiting.  No diarrhea. Positive constipation. Genitourinary: Chronic indwelling foley Musculoskeletal: Positive back, neck, and bilateral knee pain.  Skin: Negative for rash. Neurological: Negative for headaches . ____________________________________________   PHYSICAL EXAM:  VITAL SIGNS: ED Triage Vitals  Enc Vitals Group     BP --      Pulse Rate 06/03/22 0019 (!) 107     Resp 06/03/22 0019 17     Temp --      Temp src --      SpO2 06/03/22 0019 100 %      Weight 06/03/22 0006 235 lb 14.3 oz (107 kg)     Height 06/03/22 0006 _0  (1.753 m)   Constitutional: Alert and oriented. Well appearing and in no acute distress. Patient drinking a soda from [employer] when I walk in the room.  Eyes: Conjunctivae are normal.  Head: Atraumatic. Nose: No congestion/rhinnorhea. Mouth/Throat: Mucous membranes are moist.  Neck: No stridor.   Cardiovascular: Tachycardia. Good peripheral circulation. Grossly normal heart sounds.   Respiratory: Normal respiratory effort.  No retractions. Lungs CTAB. Gastrointestinal: Soft and nontender. No distention.  Musculoskeletal: No lower extremity tenderness nor edema. No gross deformities of extremities. Neurologic:  Normal speech and language. Patient with baseline paraplegia and LUE weakness.  Skin:  Skin is warm, dry and intact. No rash noted.   ____________________________________________   LABS (all labs ordered are listed, but only abnormal results are displayed)  Labs Reviewed  URINE CULTURE - Abnormal; Notable for the following components:      Result Value   Culture   (*)    Value: <10,000 COLONIES/mL INSIGNIFICANT GROWTH Performed at [facility], [place] [address] [place], [place] [address]    All other components within normal limits  RESP PANEL BY RT-PCR (FLU A&B, COVID) ARPGX2 - Abnormal; Notable for the following components:   SARS Coronavirus 2 by RT PCR POSITIVE (*)    All other components within normal limits  COMPREHENSIVE METABOLIC PANEL -  Abnormal; Notable for the following components:   Potassium 3.1 (*)    Chloride 97 (*)    BUN 43 (*)    Creatinine, Ser 2.35 (*)    Calcium 11.3 (*)    Total Protein 8.7 (*)    AST 49 (*)    ALT 52 (*)    Total Bilirubin 1.9 (*)    GFR, Estimated 35 (*)    Anion gap 17 (*)    All other components within normal limits  CBC WITH DIFFERENTIAL/PLATELET - Abnormal; Notable for the following components:   Hemoglobin 12.5 (*)    HCT 36.0 (*)     All other components within normal limits  URINALYSIS, ROUTINE W REFLEX MICROSCOPIC - Abnormal; Notable for the following components:   Hgb urine dipstick MODERATE (*)    All other components within normal limits  RAPID URINE DRUG SCREEN, HOSP PERFORMED - Abnormal; Notable for the following components:   Opiates POSITIVE (*)    Amphetamines POSITIVE (*)    All other components within normal limits  SEDIMENTATION RATE - Abnormal; Notable for the following components:   Sed Rate 51 (*)    All other components within normal limits  C-REACTIVE PROTEIN - Abnormal; Notable for the following components:   CRP 8.6 (*)    All other components within normal limits  CBC - Abnormal; Notable for the following components:   RBC 3.22 (*)    Hemoglobin 9.5 (*)    HCT 26.7 (*)    Platelets 119 (*)    All other components within normal limits  COMPREHENSIVE METABOLIC PANEL - Abnormal; Notable for the following components:   Potassium 3.4 (*)    CO2 19 (*)    BUN 26 (*)    Creatinine, Ser 1.90 (*)    Calcium 8.8 (*)    Total Protein 6.1 (*)    Albumin 2.7 (*)    GFR, Estimated 45 (*)    All other components within normal limits  BASIC METABOLIC PANEL - Abnormal; Notable for the following components:   Sodium 133 (*)    Creatinine, Ser 1.59 (*)    GFR, Estimated 56 (*)    All other components within normal limits  CBC - Abnormal; Notable for the following components:   RBC 3.38 (*)    Hemoglobin 10.1 (*)    HCT 27.9 (*)    MCHC 36.2 (*)    Platelets 122 (*)    All other components within normal limits  MAGNESIUM - Abnormal; Notable for the following components:   Magnesium 1.4 (*)    All other components within normal limits  CULTURE, BLOOD (ROUTINE X 2)  CULTURE, BLOOD (ROUTINE X 2)  LIPASE, BLOOD  LACTIC ACID, PLASMA  LACTIC ACID, PLASMA  CK  HIV ANTIBODY (ROUTINE TESTING W REFLEX)   ____________________________________________  EKG   EKG Interpretation  Date/Time:  Sunday  June 03 2022 02:25:36 EST Ventricular Rate:  106 PR Interval:  130 QRS Duration: 94 QT Interval:  344 QTC Calculation: 457 R Axis:   51 Text Interpretation: Sinus tachycardia Borderline T abnormalities, inferior leads Baseline wander in lead(s) V2 Confirmed by Nanda Quinton [phone]) on 06/03/2022 3:17:09 AM        ____________________________________________  RADIOLOGY  CT Knee Left Wo Contrast  Result Date: 06/03/2022 CLINICAL DATA:  Knee trauma with occult injury suspected. EXAM: CT OF THE LEFT KNEE WITHOUT CONTRAST TECHNIQUE: Multidetector CT imaging of the left knee was performed according to the standard protocol. Multiplanar CT  image reconstructions were also generated. RADIATION DOSE REDUCTION: This exam was performed according to the departmental dose-optimization program which includes automated exposure control, adjustment of the mA and/or kV according to patient size and/or use of iterative reconstruction technique. COMPARISON:  None Available. FINDINGS: Bones/Joint/Cartilage No acute fracture. Fragment at the medial patella which is corticated and chronic. Given location, question prior lateral transient patellar dislocation including on the prior radiograph. Generalized degenerative marginal spurring and joint space narrowing especially along the lateral facet of the patellofemoral compartment. Subchondral lucency at the medial femoral condyle without discrete fracture line, also favored chronic. Trace knee joint fluid with regional anterior reticulation. Ligaments Suboptimally assessed by CT. Muscles and Tendons Intact extensor complex.  No major or muscular tear noted. Soft tissues Premature arterial calcification. IMPRESSION: 1. Anterior soft tissue swelling without fracture or dislocation. 2. 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 Electronically Signed   By: Jorje Guild M.D.   On: 06/03/2022 04:10   CT ABDOMEN PELVIS W  CONTRAST  Result Date: 06/03/2022 CLINICAL DATA:  Sepsis. EXAM: CT ABDOMEN AND PELVIS WITH CONTRAST TECHNIQUE: Multidetector CT imaging of the abdomen and pelvis was performed using the standard protocol following bolus administration of intravenous contrast. RADIATION DOSE REDUCTION: This exam was performed according to the departmental dose-optimization program which includes automated exposure control, adjustment of the mA and/or kV according to patient size and/or use of iterative reconstruction technique. CONTRAST:  58m OMNIPAQUE IOHEXOL 350 MG/ML SOLN COMPARISON:  CT abdomen and pelvis 05/23/2010 FINDINGS: Lower chest: No acute abnormality. Hepatobiliary: No focal liver abnormality is seen. No gallstones, gallbladder wall thickening, or biliary dilatation. Pancreas: Unremarkable. No pancreatic ductal dilatation or surrounding inflammatory changes. Spleen: Moderately enlarged, unchanged. Adrenals/Urinary Tract: Right renal cortical cyst with peripheral calcifications again seen measuring 8 mm, unchanged. Otherwise, the kidneys, adrenal glands, and bladder are within normal limits. Stomach/Bowel: Stomach is within normal limits. Appendix appears normal. No evidence of bowel wall thickening, distention, or inflammatory changes. There is a large amount of stool throughout the entire colon. Vascular/Lymphatic: No significant vascular findings are present. No enlarged abdominal or pelvic lymph nodes. Reproductive: Prostate is unremarkable. Other: No abdominal wall hernia or abnormality. No abdominopelvic ascites. Musculoskeletal: There is new vertebral body height loss and fusion at the L1-L2 vertebral bodies with kyphosis at this level. Findings are likely related to old injury. This is not seen in 2011. There is mild chronic appearing compression deformity of the superior endplate of L4 which is also new from 2011. 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 There are new  degenerative changes at L5-S1. IMPRESSION: 1. No acute localizing process in the abdomen or pelvis. 2. Large amount of stool throughout the colon. 3. Stable moderate splenomegaly. 4. Chronic appearing fractures at L1, L2 and L4 appear new from 2011. Please correlate clinically. 5. 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 Electronically Signed   By: ARonney AstersM.D.   On: 06/03/2022 01:49   CT Head Wo Contrast  Result Date: 06/03/2022 CLINICAL DATA:  Fall, paraplegic EXAM: CT HEAD WITHOUT CONTRAST CT CERVICAL SPINE WITHOUT CONTRAST TECHNIQUE: Multidetector CT imaging of the head and cervical spine was performed following the standard protocol without intravenous contrast. Multiplanar CT image reconstructions of the cervical spine were also generated. RADIATION DOSE REDUCTION: This exam was performed according to the departmental dose-optimization program which includes automated exposure control, adjustment of the mA and/or kV according to patient size and/or use of iterative reconstruction technique.  COMPARISON:  MRI cervical spine dated 11/21/2020. CT head dated 11/21/2020. FINDINGS: CT HEAD FINDINGS Brain: No evidence of acute infarction, hemorrhage, hydrocephalus, extra-axial collection or mass lesion/mass effect. Vascular: No hyperdense vessel or unexpected calcification. Skull: Normal. Negative for fracture or focal lesion. Sinuses/Orbits: The visualized paranasal sinuses are essentially clear. The mastoid air cells are unopacified. Other: None. CT CERVICAL SPINE FINDINGS Alignment: Reversal of the normal lower cervical lordosis. Skull base and vertebrae: No acute fracture. No primary bone lesion or focal pathologic process. C6-T1 fusion. Soft tissues and spinal canal: No prevertebral fluid or swelling. No visible canal hematoma. Disc levels: Degenerative changes at C6-T1. Spinal canal is patent. Upper chest: Visualized lung apices are clear. Other: Visualized thyroid is  unremarkable. IMPRESSION: Normal head CT. No traumatic injury to the cervical spine. Degenerative changes with fusion at C6-T1. Electronically Signed   By: Julian Hy M.D.   On: 06/03/2022 01:46   CT Cervical Spine Wo Contrast  Result Date: 06/03/2022 CLINICAL DATA:  Fall, paraplegic EXAM: CT HEAD WITHOUT CONTRAST CT CERVICAL SPINE WITHOUT CONTRAST TECHNIQUE: Multidetector CT imaging of the head and cervical spine was performed following the standard protocol without intravenous contrast. Multiplanar CT image reconstructions of the cervical spine were also generated. RADIATION DOSE REDUCTION: This exam was performed according to the departmental dose-optimization program which includes automated exposure control, adjustment of the mA and/or kV according to patient size and/or use of iterative reconstruction technique. COMPARISON:  MRI cervical spine dated 11/21/2020. CT head dated 11/21/2020. FINDINGS: CT HEAD FINDINGS Brain: No evidence of acute infarction, hemorrhage, hydrocephalus, extra-axial collection or mass lesion/mass effect. Vascular: No hyperdense vessel or unexpected calcification. Skull: Normal. Negative for fracture or focal lesion. Sinuses/Orbits: The visualized paranasal sinuses are essentially clear. The mastoid air cells are unopacified. Other: None. CT CERVICAL SPINE FINDINGS Alignment: Reversal of the normal lower cervical lordosis. Skull base and vertebrae: No acute fracture. No primary bone lesion or focal pathologic process. C6-T1 fusion. Soft tissues and spinal canal: No prevertebral fluid or swelling. No visible canal hematoma. Disc levels: Degenerative changes at C6-T1. Spinal canal is patent. Upper chest: Visualized lung apices are clear. Other: Visualized thyroid is unremarkable. IMPRESSION: Normal head CT. No traumatic injury to the cervical spine. Degenerative changes with fusion at C6-T1. Electronically Signed   By: Julian Hy M.D.   On: 06/03/2022 01:46   DG Knee  Complete 4 Views Right  Result Date: 06/03/2022 CLINICAL DATA:  Fever EXAM: RIGHT KNEE - COMPLETE 4+ VIEW COMPARISON:  None Available. FINDINGS: No evidence of fracture, dislocation, or joint effusion. No evidence of arthropathy or other focal bone abnormality. Soft tissues are unremarkable. IMPRESSION: Negative. Electronically Signed   By: Ulyses Jarred M.D.   On: 06/03/2022 01:13   DG Knee Complete 4 Views Left  Result Date: 06/03/2022 CLINICAL DATA:  Fever EXAM: LEFT KNEE - COMPLETE 4+ VIEW COMPARISON:  None Available. FINDINGS: No acute fracture. There is lateral subluxation of the patella. Femorotibial joint space is normal. No knee effusion. IMPRESSION: Lateral subluxation of the left patella.  No acute fracture. Electronically Signed   By: Ulyses Jarred M.D.   On: 06/03/2022 01:11   DG Chest Portable 1 View  Result Date: 06/03/2022 CLINICAL DATA:  Fever EXAM: PORTABLE CHEST 1 VIEW COMPARISON:  None Available. FINDINGS: The heart size and mediastinal contours are within normal limits. Both lungs are clear. The visualized skeletal structures are unremarkable. IMPRESSION: No active disease. Electronically Signed   By: Cletus Gash.D.  On: 06/03/2022 01:09    ____________________________________________   PROCEDURES  Procedure(s) performed:   Procedures  None ____________________________________________   INITIAL IMPRESSION / ASSESSMENT AND PLAN / ED COURSE  Pertinent labs & imaging results that were available during my care of the patient were reviewed by me and considered in my medical decision making (see chart for details).   This patient is Presenting for Evaluation of fall/fever, which does require a range of treatment options, and is a complaint that involves a high risk of morbidity and mortality.  The Differential Diagnoses include sepsis, UTI, discitis, traumatic ICH, etc.  Critical Interventions-    Medications  morphine (PF) 4 MG/ML injection 4 mg (4 mg  Intravenous Given 06/03/22 0251)  promethazine (PHENERGAN) 12.5 mg in sodium chloride 0.9 % 50 mL IVPB (12.5 mg Intravenous New Bag/Given 06/05/22 0316)  heparin injection 5,000 Units (5,000 Units Subcutaneous Given 06/05/22 0543)  sodium chloride flush (NS) 0.9 % injection 3 mL (3 mLs Intravenous Given 06/04/22 2219)  acetaminophen (TYLENOL) tablet 650 mg (650 mg Oral Given 06/03/22 1242)    Or  acetaminophen (TYLENOL) suppository 650 mg ( Rectal See Alternative 06/03/22 1242)  hydrALAZINE (APRESOLINE) injection 10 mg (has no administration in time range)  senna-docusate (Senokot-S) tablet 1 tablet (1 tablet Oral Given 06/04/22 2215)  molnupiravir EUA (LAGEVRIO) capsule 800 mg (800 mg Oral Given 06/04/22 2215)  ascorbic acid (VITAMIN C) tablet 500 mg (500 mg Oral Given 06/04/22 0911)  zinc sulfate capsule 220 mg (220 mg Oral Given 06/04/22 0911)  guaiFENesin-dextromethorphan (ROBITUSSIN DM) 100-10 MG/5ML syrup 10 mL (has no administration in time range)  chlorpheniramine-HYDROcodone (TUSSIONEX) 10-8 MG/5ML suspension 5 mL (has no administration in time range)  albuterol (VENTOLIN HFA) 108 (90 Base) MCG/ACT inhaler 2 puff (has no administration in time range)  doxycycline (VIBRA-TABS) tablet 100 mg (100 mg Oral Given 06/04/22 2215)  aspirin EC tablet 81 mg (81 mg Oral Given 06/04/22 0911)  DULoxetine (CYMBALTA) DR capsule 40 mg (40 mg Oral Given 06/04/22 2215)  gabapentin (NEURONTIN) capsule 300 mg (300 mg Oral Given 06/04/22 2215)  lidocaine (LIDODERM) 5 % 1 patch (1 patch Transdermal Patch Removed 06/05/22 0401)  0.9 %  sodium chloride infusion ( Intravenous Stopped 06/04/22 0620)  polyethylene glycol (MIRALAX / GLYCOLAX) packet 17 g (17 g Oral Patient Refused/Not Given 06/04/22 1427)  magnesium sulfate IVPB 2 g 50 mL (has no administration in time range)  magnesium oxide (MAG-OX) tablet 400 mg (has no administration in time range)  sodium chloride 0.9 % bolus 1,000 mL (0 mLs Intravenous  Stopped 06/03/22 0553)  iohexol (OMNIPAQUE) 350 MG/ML injection 75 mL (75 mLs Intravenous Contrast Given 06/03/22 0129)  sodium chloride 0.9 % bolus 1,000 mL (0 mLs Intravenous Stopped 06/03/22 0830)  potassium chloride 10 mEq in 100 mL IVPB (0 mEq Intravenous Stopped 06/03/22 0757)  potassium chloride SA (KLOR-CON M) CR tablet 40 mEq (40 mEq Oral Given 06/03/22 1020)  potassium chloride SA (KLOR-CON M) CR tablet 40 mEq (40 mEq Oral Given 06/04/22 1427)    Reassessment after intervention: Pain and nausea improved.    I did obtain Additional Historical Information from family by phone. Patient last on ertapenem 2 weeks prior for ESBL/Klebsiella UTI.  I decided to review pertinent External Data, and in summary patient with a merged chart with discharge summary from [place].   Clinical Laboratory Tests Ordered, included CBC without leukocytosis. Mild anemia. Creatinine 2.35. Potassium of 3.1. Lactic acid.   Radiologic Tests Ordered, included CT abdomen/pelvis, CT  head, c spine, and left knee. I independently interpreted the images and agree with radiology interpretation.   Cardiac Monitor Tracing which shows NSR.    Social Determinants of Health Risk patient with polysubstance abuse.  Consult complete with Hospitalist. Plan for admit.   Medical Decision Making: Summary:  Patient presents emergency department with fever and fall from bed with lying on the ground for the past 2 days.  No clear signs of traumatic injury on my evaluation but will obtain plain films and CT imaging both for evaluation of potential trauma but also to identify underlying infectious process.  He has history of MRSA bacteremia, ESBL UTI discitis/osteomyelitis as well. Despite this, patient is overall looking very well at this time.   Reevaluation with update and discussion with patient. Imaging reassuring at this time. UA pending. Plan for admit for IVF, K replacement.   Disposition:  admit  ____________________________________________  FINAL CLINICAL IMPRESSION(S) / ED DIAGNOSES  Final diagnoses:  Fever, unspecified fever cause  Fall, initial encounter  Dehydration    Note:  This document was prepared using Dragon voice recognition software and may include unintentional dictation errors.  Nanda Quinton, MD, [facility] Emergency Medicine    Nguyet Mercer, [facility], MD 06/05/22 [phone]

## 2022-06-03 NOTE — H&P (Signed)
History and Physical    Patient: Ronald Hampton KZS:[id number] DOB: 18-Sep-1980 DOA: 06/02/2022 DOS: the patient was seen and examined on 06/03/2022 PCP: Patient, No Pcp Per  Patient coming from: Home via EMS  Chief Complaint:  Chief Complaint  Patient presents with   uti   HPI: Ronald Hampton Hampton is a 41 y.o. male with medical history significant of DM type II, chronic hepatitis C,  prior back surgeries, bipolar disorder, PTSD, testicular cancer, polysubstance abuse including prior IV drug use (methamphetamine and heroin) who presents after falling out of bed.    Review of records note patient had been hospitalized at [facility] after being transferred from another facility from 5/19-11/3 with a significantly complicated hospital course.  Patient had attempted suicide by heroin overdose which he relates to son dying of SIDS.  CT imaging showed acute cerebellar ICH with IVH.  Patient was found to be septic and intubated for airway protection.  MRI of the brain had noted small subacute infarcts in the bilateral cerebellar hemispheres given concern for septic emboli as he was found to have MRSA bacteremia.  No vegetations were noted on TEE although initial heart function was reduced 45-50%, but repeat echo 12/2021 noted EF to be 60-65%.  Patient had been put on broad-spectrum antibiotics and evaluated by ID.  Found to have C6/7, C7/T1, L2/L3, and L5/S1 discitis and osteomyelitis as well as paraspinal abscess.  He had completed IV antibiotics and repeat imaging noted improvement in discitis and osteomyelitis with no evidence of abscess on 9/17.  Infectious disease had recommended him to continue on doxycycline 100 mg p.o. twice daily for suppression.  For drug abuse patient had been put on methadone, but he appears not to have been on this medication.  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  He had been put on Cymbalta for  depression and pain.  Patient was found to have ESBL Klebsiella pneumoniae and E.Coli UTI on 9/12 - s/p ertapenem 9/16 - 9/21.  During his hospital stay patient was noted to have hyperkalemia with SPEP showed: The SPE pattern demonstrates a single peak (M-spike) in the beta region which may represent monoclonal protein. This peak may also be caused by the presence of fibrinogen or circulating immune complexes. If clinically indicated, the presence of a monoclonal gammopathy may be confirmed by serum immunofixation. Immunofixation showed serum immunofixation IgG Kappa spike.  Patient underwent bone marrow biopsy on 05/10/2022. Bone marrow biopsy report was positive for possible multiple myeloma.  He was scheduled to follow-up with oncology in the outpatient setting.  His creatinine was around 2.0 at discharge.  After getting discharged he had went to a nursing facility, but reports that he did not like the care that he was getting there.  He left the facility and went to his aunts house he thinks less than a week ago.  Since being home he had not been on any medications.  He reports that he fell out of bed 3 days ago and hit a Orthoptist.  He did not want to move until the pain subsided.  He told family members not to touch him or call for help.  He thinks his last bowel movement was 2 days ago.  He reported having fevers up to 105 F at home and having generalized.  Denies having any significant cough.  Also reported some nausea and vomiting.  He felt like he was becoming septic possibly from UTI.  In the emergency department patient was noted to be tachycardic and tachypneic with blood pressures elevated to 147/94, and all other vital signs maintained.  Labs significant for WBC 10.4, hemoglobin 12.5, potassium 3.1, BUN 43, creatinine 2.35, calcium 11.3, anion gap 17, AST 49, ALT 52, total protein 8.7, total bilirubin 1.9, CK 291, and lactic acid within normal limits.  CT scan of the head and cervical spine  noted no acute normality with fusion at C6-T1.  CT scan of the abdomen pelvis noted large amount of stool throughout the colon, stable splenomegaly, and and chronic appearing fractures at L1/L2/ L4.  CT of the left knee noted anterior soft tissue swelling without acute fracture or dislocation and advanced osteoarthritis.  Blood cultures were obtained.  Patient had been bolused 2 L of normal saline IV fluid, potassium chloride 10 mEq IV, Phenergan, and morphine for pain.  Review of Systems: As mentioned in the history of present illness. All other systems reviewed and are negative. Past Medical History:  Diagnosis Date   Bipolar 1 disorder ([place])    Depression    PTSD (post-traumatic stress disorder)    Past Surgical History:  Procedure Laterality Date   HERNIA REPAIR     Right Foot Surgery     Social History:  reports that he has never smoked. He has never used smokeless tobacco. He reports that he does not currently use alcohol. He reports current drug use. Drug: Heroin.  Allergies  Allergen Reactions   Sulfa Antibiotics Anaphylaxis   Tomato Hives and Shortness Of Breath   Trazodone And Nefazodone    Zofran [Ondansetron Hcl] Rash    No family history on file.  Prior to Admission medications   Medication Sig Start Date End Date Taking? Authorizing Provider  ARIPiprazole (ABILIFY) 20 MG tablet Take 1 tablet (20 mg total) by mouth daily. For mood control Patient not taking: Reported on 06/03/2022 11/14/17   Lindell Spar I, NP  ARIPiprazole ER (ABILIFY MAINTENA) 400 MG SRER injection Inject 2 mLs (400 mg total) into the muscle every 28 (twenty-eight) days. Patient not taking: Reported on 06/03/2022 11/21/17   Lindell Spar I, NP  blood glucose meter kit and supplies KIT Dispense based on patient and insurance preference. Use up to four times daily as directed. (FOR ICD-9 250.00, 250.01). ( 11/14/17   Nwoko, Herbert Pun I, NP  divalproex (DEPAKOTE) 500 MG DR tablet Take 1 tablet (500 mg total) by  mouth every 12 (twelve) hours. For mood stabilization Patient not taking: Reported on 06/03/2022 11/14/17   Lindell Spar I, NP  doxepin (SINEQUAN) 25 MG capsule Take 1 capsule (25 mg total) by mouth at bedtime. For insomnia Patient not taking: Reported on 06/03/2022 11/14/17   Lindell Spar I, NP  fluvoxaMINE (LUVOX) 100 MG tablet Take 1 tablet (100 mg total) by mouth at bedtime. For depression/OCD Patient not taking: Reported on 06/03/2022 11/14/17   Lindell Spar I, NP  hydrOXYzine (ATARAX/VISTARIL) 50 MG tablet Take 1 tablet (50 mg total) by mouth 3 (three) times daily as needed for anxiety. Patient not taking: Reported on 06/03/2022 11/14/17   Lindell Spar I, NP  insulin aspart (NOVOLOG) 100 UNIT/ML injection Take 4 units three times a day before meals: For diabetes management 11/14/17 11/14/18  Lindell Spar I, NP  insulin aspart protamine- aspart (NOVOLOG MIX 70/30) (70-30) 100 UNIT/ML injection Inject 0.18 mLs (18 Units total) into the skin 2 (two) times daily with a meal. For diabetes management Patient not taking: Reported on 06/03/2022 11/14/17  Lindell Spar I, NP  lisinopril (PRINIVIL,ZESTRIL) 10 MG tablet Take 1 tablet (10 mg total) by mouth daily. For high blood pressure Patient not taking: Reported on 06/03/2022 11/14/17   Lindell Spar I, NP  loratadine (CLARITIN) 10 MG tablet Take 1 tablet (10 mg total) by mouth daily. (May purchase from over the counter): For allergies Patient not taking: Reported on 06/03/2022 11/15/17   Lindell Spar I, NP  metFORMIN (GLUCOPHAGE) 500 MG tablet Take 1 tablet (500 mg total) by mouth 2 (two) times daily with a meal. For diabetes management Patient not taking: Reported on 06/03/2022 11/14/17   Lindell Spar I, NP  prazosin (MINIPRESS) 2 MG capsule Take 1 capsule (2 mg total) by mouth 2 (two) times daily at 8 am and 10 pm. For PTSD related nightmares Patient not taking: Reported on 06/03/2022 11/14/17   Lindell Spar I, NP  zolpidem (AMBIEN) 10 MG tablet Take 1 tablet (10 mg  total) by mouth at bedtime. For sleep Patient not taking: Reported on 06/03/2022 11/14/17   Lindell Spar I, NP    Physical Exam: Vitals:   06/03/22 0230 06/03/22 0245 06/03/22 0315 06/03/22 0700  BP: 139/88 (!) 147/94 (!) 129/91 (!) 127/99  Pulse: (!) 107 (!) 106 (!) 106 86  Resp: 14 (!) _0 SpO2: 99% 99% 98% 98%  Weight:      Height:       Constitutional: Ill-appearing middle-age male currently in no acute distress Eyes: PERRL, lids and conjunctivae normal ENMT: Mucous membranes are moist.  Poor dentition with multiple dental caries and missing/broken teeth. Neck: normal, supple  Respiratory: clear to auscultation bilaterally, no wheezing, no crackles. Normal respiratory effort. No accessory muscle use.  Cardiovascular: Tachycardic.  2+ pedal pulses.  Abdomen: no tenderness, no masses palpated.  Bowel sounds positive.  Musculoskeletal: no clubbing / cyanosis. No joint deformity upper and lower extremities.  Poor muscle tone. Skin: Wounds of the hand. Neurologic: CN 2-12 grossly intact. Sensation intact.  Functional paraplegia Psychiatric: Normal judgment and insight. Alert and oriented x 3. Normal mood. \ Data Reviewed:  EKG reveals sinus tachycardia at 106 bpm.  Reviewed labs, imaging and pertinent records as noted above in the HPI.  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  Patient presents after a fall while staying at his aunt's house approximately 3 days ago and refusing to allow anyone to help him up.  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  CK did not give concern for rhabdo at 291. -Admit to a telemetry bed -PT/OT to evaluate and treat -Continue gabapentin -Transitions of care consulted for likely need of placement  SIRS Patient reports having fevers up to 105 F prior to arrival, but suspect temperature went up to 100.5 F.  He was found to be  tachycardic and tachypneic meeting SIRS criteria.  Blood cultures were obtained.  Patient with prior history of MRSA bacteremia, ESBL Klebsiella, and osteomyelitis discitis.  It appears he is supposed to be on doxycycline 100 mg twice daily for suppression, but patient has not been on this since leaving the nursing facility couple days ago. -Follow-up blood cultures -Check urinalysis and culture -Restarted doxycycline  COVID-19 infection Acute.  He reports complaints of fever and malaise. Patient was incidentally found to be positive for COVID-19.     O2 saturations currently maintained on room air.Chest x-ray noted no acute abnormality.  Suspect patient's symptoms could be coming from [place]. 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  Calcium 11.3 on admission, but during prior hospitalization had been noted to be around 10.  There has been concern for the possibility of multiple myeloma for which he has been seen by oncology who notes concern for a monoclonal gammopathy versus the possibility of POEMS.  Plan was for possible EMG studies. -Continue normal saline IV fluids until tomorrow morning.   -Recheck calcium levels -Continue outpatient follow-up with hematology/oncology at [facility].  Test base with oncology who recommended the same.  Possible acute kidney injury on chronic kidney disease stage stage IIIb Patient presents with creatinine of 2.35 with BUN 43.  Baseline creatinine reported to be around 2 prior to the patient being discharged from the hospital on 11/3.  Urinalysis noted hemoglobin but no significant signs of infection. -Strict I&Os -Hold possible nephrotoxic agents -Recheck kidney function  History of osteomyelitis/discitis, MRSA bacteremia As noted above in the HPI.  Discharge summary from 11/3 note patient was supposed to be continued on doxycycline 100 mg p.o. twice  daily for suppression with proposed end date of 07/17/2022. -Follow-up blood cultures -Continue doxycycline  Constipation On CT scan patient was noted to have significant amount of stool throughout the colon. -Senokot twice daily  Transaminitis and hyperbilirubinemia Acute.  Labs noted AST 49, ALT 52, and total bilirubin 1.9.  He has no chronic hepatitis C -Continue to monitor  Normocytic anemia Hemoglobin 12.5 which appears slightly higher than last checked on 11/8 of 11.7.  This may be secondary to dehydration.  No reports of bleeding. -Continue to monitor  Depression Patient has been recommended to be on Cymbalta 40 mg twice daily. -Continue Cymbalta  Chronic hepatitis C -Patient needs to follow-up with ID in the outpatient setting for treatment.  History of CVA history of ICH and IVH As seen above in HPI -Continue aspirin  History of polysubstance abuse Patient with history of IV drug abuse including heroin and methamphetamines.  He has been recommended to be transitioned to methadone, but currently not taking this medication. -Follow-up UDS   Resumed some meds from prior hospitalization discharge summary, but appears patient may not have been on these medicines over the last few days after leaving the SNF.  DVT prophylaxis: Heparin Advance Care Planning:   Code Status: Full Code    Consults: None  Family Communication:No one available  Severity of Illness: The appropriate patient status for this patient is INPATIENT. Inpatient status is judged to be reasonable and necessary in order to provide the required intensity of service to ensure the patient's safety. The patient's presenting symptoms, physical exam findings, and initial radiographic and laboratory data in the context of their chronic comorbidities is felt to place them at high risk for further clinical deterioration. Furthermore, it is not anticipated that the patient will be medically stable for discharge from  the hospital within 2 midnights of admission.   * I certify that at the point of admission it is my clinical judgment that the patient will require inpatient hospital care spanning beyond 2 midnights from the point of admission due to high intensity of service, high risk for further deterioration and high frequency of surveillance required.*  Author: Norval Morton, MD 06/03/2022 7:12 AM  For on call review [email or web address].

## 2022-06-03 NOTE — ED Notes (Signed)
Pt resting in bed, no s/s of distress, will continue to monitor.

## 2022-06-03 NOTE — Progress Notes (Cosign Needed)
Call to the room as the primary found a syringe with liquid when moving patient from the stretcher to the bed. The patient admitted that he has used methamphetamine in the past. The patient was informed of the syringe that was found by the staff. He initially stated that the syringe belonged to his brother that has visited him. Then when asked again, he admitted that it belonged to him. He allowed his belongings to be searched. Primary nurse was called into the room. More used emptied syringes were found. He asked why weren't the syringes found in the ED when his items were searched. The empty syringes were placed in the sharp container. Syringe with liquid taken. MD to be informed.

## 2022-06-03 NOTE — ED Notes (Signed)
Advised patient a urine specimen was ordered, that his drainage bag was empty from his condom cath, advised an in and out cath was ordered, pt refused.  Advised we could at least bladder scan to assure he wasn't retaining, while palpating abdomen, it's soft, non-tender, patient advised " there was no need to do any of that" that he has been really dehydrated and didn't need or want any of that done.  Tamala Julian, MD notified of refusal.

## 2022-06-03 NOTE — Progress Notes (Deleted)
Call to the room as the primary found a syringe with liquid when moving patient from the stretcher to the bed. The patient admitted that he has used methamphetamine in the past. The patient was informed of the syringe that was found by the staff. He initially stated that the syringe belonged to his brother that has visited him. Then when asked again, he admitted that it belonged to him. He allowed his belongings to be searched. Primary nurse was called into the room. More used emptied syringes were found. He asked why weren't the syringes found in the ED when his items were searched. The empty syringes were placed in the sharp container. Syringe with liquid taken. MD to be informed.

## 2022-06-03 NOTE — ED Notes (Signed)
Pt refusing in and out cath from this RN, pt also refusing for this RN to bladder scan pt. Attending MD Tamala Julian notified. Condom cath remains on pt, will continue to monitor.

## 2022-06-03 NOTE — Progress Notes (Signed)
During med pass, pt was educated on heparin and what it prevents. Pt declined. Asked again and explained what could happen and PT refused again. Did ask the Pt if he would want The STD'S explained what they do and PT declined and said "Im good". Pt understands the risks of blood clots. Dr informed. Pt also refused a foam on his bottom and to look at his buttock again.

## 2022-06-04 DIAGNOSIS — U071 COVID-19: Secondary | ICD-10-CM | POA: Diagnosis not present

## 2022-06-04 DIAGNOSIS — L8990 Pressure ulcer of unspecified site, unspecified stage: Secondary | ICD-10-CM | POA: Insufficient documentation

## 2022-06-04 DIAGNOSIS — W19XXXA Unspecified fall, initial encounter: Secondary | ICD-10-CM | POA: Diagnosis not present

## 2022-06-04 DIAGNOSIS — R6510 Systemic inflammatory response syndrome (SIRS) of non-infectious origin without acute organ dysfunction: Secondary | ICD-10-CM | POA: Diagnosis not present

## 2022-06-04 DIAGNOSIS — L89151 Pressure ulcer of sacral region, stage 1: Secondary | ICD-10-CM

## 2022-06-04 DIAGNOSIS — G7281 Critical illness myopathy: Secondary | ICD-10-CM | POA: Diagnosis not present

## 2022-06-04 LAB — URINE CULTURE: Culture: <10000 — AB

## 2022-06-04 LAB — COMPREHENSIVE METABOLIC PANEL
ALT: 31 U/L (ref 0–44)
AST: 27 U/L (ref 15–41)
Albumin: 2.7 g/dL — ABNORMAL LOW (ref 3.5–5.0)
Alkaline Phosphatase: 42 U/L (ref 38–126)
Anion gap: 8 (ref 5–15)
BUN: 26 mg/dL — ABNORMAL HIGH (ref 6–20)
CO2: 19 mmol/L — ABNORMAL LOW (ref 22–32)
Calcium: 8.8 mg/dL — ABNORMAL LOW (ref 8.9–10.3)
Chloride: 110 mmol/L (ref 98–111)
Creatinine, Ser: 1.9 mg/dL — ABNORMAL HIGH (ref 0.61–1.24)
GFR, Estimated: 45 mL/min — ABNORMAL LOW (ref >60)
Glucose, Bld: 89 mg/dL (ref 70–99)
Potassium: 3.4 mmol/L — ABNORMAL LOW (ref 3.5–5.1)
Sodium: 137 mmol/L (ref 135–145)
Total Bilirubin: 0.7 mg/dL (ref 0.3–1.2)
Total Protein: 6.1 g/dL — ABNORMAL LOW (ref 6.5–8.1)

## 2022-06-04 LAB — CBC
HCT: 26.7 % — ABNORMAL LOW (ref 39.0–52.0)
Hemoglobin: 9.5 g/dL — ABNORMAL LOW (ref 13.0–17.0)
MCH: 29.5 pg (ref 26.0–34.0)
MCHC: 35.6 g/dL (ref 30.0–36.0)
MCV: 82.9 fL (ref 80.0–100.0)
Platelets: 119 10*3/uL — ABNORMAL LOW (ref 150–400)
RBC: 3.22 MIL/uL — ABNORMAL LOW (ref 4.22–5.81)
RDW: 14.7 % (ref 11.5–15.5)
WBC: 6.6 10*3/uL (ref 4.0–10.5)
nRBC: 0 % (ref 0.0–0.2)

## 2022-06-04 MED ORDER — POLYETHYLENE GLYCOL 3350 17 G PO PACK
17.0000 g | PACK | Freq: Every day | ORAL | Status: DC
  Administered 2022-06-06 – 2022-06-07 (×2): 17 g via ORAL
  Filled 2022-06-04 (×8): qty 1

## 2022-06-04 MED ORDER — POTASSIUM CHLORIDE CRYS ER 20 MEQ PO TBCR
40.0000 meq | EXTENDED_RELEASE_TABLET | Freq: Once | ORAL | Status: AC
  Administered 2022-06-04: 40 meq via ORAL
  Filled 2022-06-04: qty 2

## 2022-06-04 NOTE — Progress Notes (Signed)
Pt has been refusing to be checked for incontinence. Checking due to less then 20 ccs out in his condom cath. Pt also refused heparin again this am.

## 2022-06-04 NOTE — TOC Progression Note (Signed)
Transition of Care [facility]) - Initial/Assessment Note    Patient Details  Name: Ronald Hampton MRN: [id number] Date of Birth: [birth year]  Transition of Care [facility] CM/SW Contact:    Milinda Antis, LCSWA Phone Number: 06/04/2022, 1:03 PM  Clinical Narrative:                 LCSW received and acknowledges consult for SNF.  PT assessments and recommendations are needed in order to begin SNF process.  Pending: PT assessment    Patient Goals and CMS Choice        Expected Discharge Plan and Services                                                Prior Living Arrangements/Services                       Activities of Daily Living   ADL Screening (condition at time of admission) Patient's cognitive ability adequate to safely complete daily activities?: Yes Is the patient deaf or have difficulty hearing?: No Does the patient have difficulty seeing, even when wearing glasses/contacts?: No Does the patient have difficulty concentrating, remembering, or making decisions?: No Patient able to express need for assistance with ADLs?: Yes Does the patient have difficulty dressing or bathing?: Yes Independently performs ADLs?: No Communication: Independent Dressing (OT): Needs assistance Is this a change from baseline?: Pre-admission baseline Grooming: Needs assistance Is this a change from baseline?: Pre-admission baseline Feeding: Independent Bathing: Needs assistance Is this a change from baseline?: Pre-admission baseline Toileting: Needs assistance Is this a change from baseline?: Pre-admission baseline In/Out Bed: Needs assistance Is this a change from baseline?: Pre-admission baseline Walks in Home: Dependent Is this a change from baseline?: Pre-admission baseline Weakness of Legs: Both Weakness of Arms/Hands: Both  Permission Sought/Granted                  Emotional Assessment              Admission diagnosis:  Dehydration  [E86.0] Fall [W19.XXXA] AKI (acute kidney injury) ([place]) [N17.9] Fall, initial encounter [W19.XXXA] Fever, unspecified fever cause [R50.9] Patient Active Problem List   Diagnosis Date Noted   Pressure injury of skin 06/04/2022   Fall 06/03/2022   Acute kidney injury superimposed on chronic kidney disease ([place]) 06/03/2022   SIRS (systemic inflammatory response syndrome) ([place]) 06/03/2022   COVID-19 virus infection 06/03/2022   Hypercalcemia 06/03/2022   Monoclonal gammopathy 06/03/2022   Constipation 06/03/2022   History of discitis 06/03/2022   History of bacteremia 06/03/2022   History of osteomyelitis 06/03/2022   Transaminitis 06/03/2022   Hyperbilirubinemia 06/03/2022   Normocytic anemia 06/03/2022   Depression 06/03/2022   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   Chronic hepatitis C ([place]) 06/03/2022   History of CVA (cerebrovascular accident) 06/03/2022   Polysubstance abuse ([place]) 06/03/2022   MDD (major depressive disorder), severe ([place]) 11/10/2017   DM (diabetes mellitus), type 2, uncontrolled 10/23/2017   HTN (hypertension) 10/23/2017   OCD (obsessive compulsive disorder) 10/23/2017   Bipolar I disorder, current or most recent episode depressed, with psychotic features ([place]) 10/22/2017   PTSD (post-traumatic stress disorder) 10/22/2017   Opioid use disorder, moderate, dependence ([place]) 10/22/2017   Suicidal ideation 10/22/2017   PCP:  Patient, No Pcp Per Pharmacy:   [place] [place] -  [address] [place] [address] Phone: [phone] Fax: [phone]     Social Determinants of Health (SDOH) Interventions    Readmission Risk Interventions     No data to display

## 2022-06-04 NOTE — Plan of Care (Signed)
  Problem: Education: Goal: Knowledge of risk factors and measures for prevention of condition will improve Outcome: Progressing   Problem: Coping: Goal: Psychosocial and spiritual needs will be supported Outcome: Progressing   Problem: Respiratory: Goal: Will maintain a patent airway Outcome: Progressing Goal: Complications related to the disease process, condition or treatment will be avoided or minimized Outcome: Progressing   

## 2022-06-04 NOTE — Progress Notes (Signed)
This Agricultural consultant was given in Charge report that a syringe not provided by the hospital was found in patients room.  Was told by weekend shift that the lab personnel that is able to test the substance would not be back until Monday (today).  [facility] was contacted and it was confirmed that lab would be able to test if still warranted by the MD with a turn around time of 10-14 days.  MD was made aware and said that he did not want to test, and to discard the syringe.  RN Beverlee Nims made aware, [facility] made aware, AD made aware, and syringe was discarded by this RN, Seth Bake and Diana,RN.

## 2022-06-04 NOTE — Evaluation (Signed)
Occupational Therapy Evaluation Patient Details Name: Ronald Hampton MRN: [id number] DOB: [birth year] Today's Date: 06/04/2022   History of Present Illness Pt is a 41 y/o male who presented after a fall out of bed at home. Pt admitted for SIRS, incidentally COVID+, and possible AKI. Pt with recent hospitalization at [facility] (May-Oct 2023) after attempted suicide by overdose resulting in multiple complications including cerebellar ICH, intubation, small cerebellar infarcts, and lumbar discitis and osteomyelitis as well as paraspinal abscess w/ IV antibiotics. PMH: bipolar 1 disorder, depression, PTSD, functional paraplegia   Clinical Impression   PTA, pt lives alone and prior to May 2023 hospitalization, was completely independent in all daily tasks. Since DC in Oct 2023, pt had been completing sliding board transfers to/from w/c with little assist at [facility] rehab. However, once pt returned home, pt unable to manage ADLs/transfers as home is not wheelchair accessible. Pt presents with deficits in B LE sensation, strength, and overall endurance. Pt requires Mod A for bed mobility, able to scoot along bedside with min guard though declined OOB transfer at this time. Pt requires Setup for UB ADL and Mod-Max A for LB ADLs due to deficits. As pt does not have consistent support at DC and unable to safely care for self at home, recommend SNF rehab to maximize independence.       Recommendations for follow up therapy are one component of a multi-disciplinary discharge planning process, led by the attending physician.  Recommendations may be updated based on patient status, additional functional criteria and insurance authorization.   Follow Up Recommendations  Skilled nursing-short term rehab (<3 hours/day)     Assistance Recommended at Discharge Frequent or constant Supervision/Assistance  Patient can return home with the following A little help with walking and/or transfers;A lot of help  with bathing/dressing/bathroom;Assistance with cooking/housework;Assist for transportation;Help with stairs or ramp for entrance    Functional Status Assessment  Patient has had a recent decline in their functional status and demonstrates the ability to make significant improvements in function in a reasonable and predictable amount of time.  Equipment Recommendations  Other (comment) (drop arm BSC)    Recommendations for Other Services       Precautions / Restrictions Precautions Precautions: Fall Restrictions Weight Bearing Restrictions: No      Mobility Bed Mobility Overal bed mobility: Needs Assistance Bed Mobility: Supine to Sit, Sit to Supine     Supine to sit: Mod assist, HOB elevated Sit to supine: Mod assist   General bed mobility comments: Able to bring LEs off of bed and assist to lift trunk. Heavy use of bedrails and assist to get BLE back into bed    Transfers Overall transfer level: Needs assistance                 General transfer comment: declined OOB d/t not feeling well but able to demo scooting along bedside min guard      Balance Overall balance assessment: Needs assistance Sitting-balance support: Feet supported, Bilateral upper extremity supported Sitting balance-Leahy Scale: Poor Sitting balance - Comments: appears reliant on UE support EOB                                   ADL either performed or assessed with clinical judgement   ADL Overall ADL's : Needs assistance/impaired Eating/Feeding: Independent   Grooming: Set up;Sitting   Upper Body Bathing: Set up;Sitting   Lower Body  Bathing: Moderate assistance;Sitting/lateral leans;Bed level   Upper Body Dressing : Set up;Sitting   Lower Body Dressing: Sitting/lateral leans;Bed level;Maximal assistance       Toileting- Clothing Manipulation and Hygiene: Maximal assistance;Sitting/lateral lean;Bed level         General ADL Comments: Pt limited by BLE weakness  and general deconditioning - difficulty managing at home d/t not being handicapped accessible     Vision Ability to See in Adequate Light: 0 Adequate Patient Visual Report: No change from baseline Vision Assessment?: No apparent visual deficits     Perception     Praxis      Pertinent Vitals/Pain Pain Assessment Pain Assessment: Faces Faces Pain Scale: Hurts little more Pain Location: lower abdomen Pain Descriptors / Indicators: Grimacing, Guarding Pain Intervention(s): Monitored during session     Hand Dominance Right   Extremity/Trunk Assessment Upper Extremity Assessment Upper Extremity Assessment: RUE deficits/detail;LUE deficits/detail RUE Deficits / Details: 4-/5 biceps, triceps and shoulders. Grip strength WFL   Lower Extremity Assessment Lower Extremity Assessment: Defer to PT evaluation   Cervical / Trunk Assessment Cervical / Trunk Assessment: Normal   Communication Communication Communication: No difficulties   Cognition Arousal/Alertness: Awake/alert Behavior During Therapy: WFL for tasks assessed/performed, Flat affect Overall Cognitive Status: Within Functional Limits for tasks assessed                                       General Comments  Pt reports he was unable to manage "doing it my way" at home and now open to complying with healthcare professionals to maximize health/independence    Exercises     Shoulder Instructions      Home Living Family/patient expects to be discharged to:: Private residence Living Arrangements: Alone Available Help at Discharge: Family;Available PRN/intermittently Type of Home: Other(Comment) (condo) Home Access: Level entry     Home Layout: One level           Bathroom Accessibility: No   Home Equipment: Wheelchair - manual   Additional Comments: left SNF rehab for his condo that is not w/c accessible. Pt reports he cannot walk or stand so was unable to get around at home      Prior  Functioning/Environment Prior Level of Function : Independent/Modified Independent;Driving             Mobility Comments: prior to May 2023 hospitalization, was completely independent. Since DC to SNF rehab in October, was using sliding board to transfer to/from w/c and doing well with that per pt. Once DC home from SNF, pt reports unable to manage, stayed in bed and used pull ups for toileting as he was unable to access bathroom          OT Problem List: Decreased strength;Decreased activity tolerance;Impaired balance (sitting and/or standing);Decreased knowledge of use of DME or AE;Impaired sensation      OT Treatment/Interventions: Self-care/ADL training;Therapeutic exercise;Energy conservation;DME and/or AE instruction;Therapeutic activities    OT Goals(Current goals can be found in the care plan section) Acute Rehab OT Goals Patient Stated Goal: get better OT Goal Formulation: With patient Time For Goal Achievement: 06/18/22 Potential to Achieve Goals: Good  OT Frequency: Min 2X/week    Co-evaluation              AM-PAC OT "6 Clicks" Daily Activity     Outcome Measure Help from another person eating meals?: None Help from another person taking care  of personal grooming?: A Little Help from another person toileting, which includes using toliet, bedpan, or urinal?: A Lot Help from another person bathing (including washing, rinsing, drying)?: A Lot Help from another person to put on and taking off regular upper body clothing?: A Little Help from another person to put on and taking off regular lower body clothing?: A Lot 6 Click Score: 16   End of Session Nurse Communication: Mobility status  Activity Tolerance: Patient tolerated treatment well Patient left: in bed;with call bell/phone within reach;with bed alarm set  OT Visit Diagnosis: Other abnormalities of gait and mobility (R26.89);Muscle weakness (generalized) (M62.81)                Time: 8478-4128 OT Time  Calculation (min): 18 min Charges:  OT General Charges $OT Visit: 1 Visit OT Evaluation $OT Eval Moderate Complexity: 1 Mod  Malachy Chamber, OTR/L Acute Rehab Services Office: [phone]   Layla Maw 06/04/2022, 8:55 AM

## 2022-06-04 NOTE — Progress Notes (Signed)
PT Cancellation Note  Patient Details Name: Ronald Hampton MRN: [id number] DOB: May 07, 1981   Cancelled Treatment:    Reason Eval/Treat Not Completed: Other (comment).  Pt refused PT stating he is too tired.  Talked with him about a short session from bedside.  Still declining to have PT see him.  Follow up at another time.   Ramond Dial 06/04/2022, 2:07 PM  Mee Hives, PT PhD Acute Rehab Dept. Number: [place] [facility] [place]

## 2022-06-04 NOTE — Progress Notes (Addendum)
PROGRESS NOTE    Ronald Hampton [name]  XLK:[id number] DOB: 1981/02/03 DOA: 06/02/2022 PCP: Ronald Hampton, No Pcp Per    Brief Narrative:   Ronald Hampton is a 41 y.o. male with medical history significant of DM type II, chronic hepatitis C,  prior back surgeries, bipolar disorder, PTSD, testicular cancer, polysubstance abuse including prior IV drug use (methamphetamine and heroin) who presented to hospital after sustaining a fall out of the bed.  Of note Ronald Hampton had a completed hospital course at [facility]  from 5/19-11/3 when Ronald Hampton had attempted suicide by heroin overdose which he relates to son dying of SIDS.  CT imaging showed acute cerebellar ICH with IVH.  Ronald Hampton was found to be septic and intubated for airway protection.  MRI of the brain had noted small subacute infarcts in the bilateral cerebellar hemispheres given concern for septic emboli as he was found to have MRSA bacteremia.  No vegetations were noted on TEE although initial heart function was reduced 45-50%, but repeat echo 12/2021 noted EF to be 60-65%.  Ronald Hampton had been put on broad-spectrum antibiotics and evaluated by ID.  Found to have C6/7, C7/T1, L2/L3, and L5/S1 discitis and osteomyelitis as well as paraspinal abscess.  He had completed IV antibiotics and repeat imaging noted improvement in discitis and osteomyelitis with no evidence of abscess on 9/17.  Infectious disease had recommended him to continue on doxycycline 100 mg p.o. twice daily for suppression.  For drug abuse Ronald Hampton had been put on methadone, but he appears not to have been on this medication.  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  He had been put on Cymbalta for depression and pain.   During his hospital stay Ronald Hampton was noted to have hyperkalemia with SPEP showed: The SPE pattern demonstrates a single peak (M-spike). Immunofixation showed serum immunofixation IgG Kappa spike.  Ronald Hampton underwent bone  marrow biopsy on 05/10/2022 and was positive for possible multiple myeloma.  He was scheduled to follow-up with oncology in the outpatient setting.  His creatinine was around 2.0 at discharge.   After he was discharged from the hospital he went to skilled nursing facility since he was unable to take care of himself.  Subsequently, Ronald Hampton had left the facility and went to live with Aunt and was not taking medication.  He had had a fall 3 days prior to presentation and started having fevers. In the ED Ronald Hampton was noted to be tachycardic, tachypneic with elevated blood pressure.  Labs showed mild hypokalemia with creatinine elevated at 2.3, calcium 11.3, CK 291, and lactic acid within normal limits.  CT scan of the head and cervical spine noted no acute abnormality with fusion at C6-T1.  CT scan of the abdomen pelvis noted large amount of stool throughout the colon, stable splenomegaly, and and chronic appearing fractures at L1/L2/ L4.  CT of the left knee noted anterior soft tissue swelling without acute fracture or dislocation and advanced osteoarthritis.  Blood cultures were obtained.  Ronald Hampton had been bolused 2 L of normal saline IV fluid, potassium chloride 10 mEq IV, Phenergan, and morphine for pain.  Ronald Hampton was then admitted hospital for further evaluation and treatment.    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  CK2 91.  PT OT evaluation.  Continue gabapentin Ronald Hampton will likely need placement.  Planes of mild pain over the lower part.   SIRS Mildly febrile tachycardic tachypneic on presentation.  Ronald Hampton with history of MRSA bacteremia ESBL E.  Klebsiella, and osteomyelitis/discitis.  She was supposed to be on doxycycline suppression but was not taking it.  Has been restarted on doxycycline.  Check urine culture, blood cultures.   COVID-19 infection Incidental with fever malaise.  No hypoxia.  Continue symptomatic care and. Molnupiravir   Hypercalcemia monoclonal gammopathy Calcium 11.3 on admission, has been seen by oncology who has an impression of monoclonal gammopathy versus the possibility of POEMS.  Plan was EMG studies.  Ronald Hampton follows up with hematology at [facility].  Admitting provider spoke with oncology who recommended the same.  Mild acute kidney injury on chronic kidney disease stage stage IIIb Presenting creatinine at 2.3.  Baseline around 2.0..  Continue  hydration.  Continue to monitor.    Mild hypokalemia.  We will replenish orally.  Check levels in AM.  History of osteomyelitis/discitis, MRSA bacteremia Doxycycline for chronic suppression has been reinitiated.  Initial proposed date was 07-17-22.  Follow blood cultures.    Constipation On  CT scan of the abdomen.  Continue stool softeners.  Add MiraLAX.   Elevated LFTs/hyperbilirubinemia AST 49, ALT 52, and total bilirubin 1.9.  Monitor CMP.   Normocytic anemia Hemoglobin of 9.5.  We will closely monitor.  Depression Continue Cymbalta   Chronic hepatitis C -Ronald Hampton needs to follow-up with ID in the outpatient setting for treatment.   History of CVA history of ICH and IVH Continue aspirin  History of polysubstance abuse Ronald Hampton with history of IV drug abuse including heroin and methamphetamines.  Urine drug screen positive for opiates and amphetamine at this time.   He was supposed to be on methadone.  Stage I coccyx pressure ulceration.  Present on admission.  Continue prevention protocol.  Pressure Injury 06/03/22 Coccyx Mid Stage 1 -  Intact skin with non-blanchable redness of a localized area usually over a bony prominence. (Active)  06/03/22 1629  Location: Coccyx  Location Orientation: Mid  Staging: Stage 1 -  Intact skin with non-blanchable redness of a localized area usually over a bony prominence.  Wound Description (Comments):   Present on Admission: Yes       DVT prophylaxis: heparin injection 5,000 Units Start: 06/03/22 1400   Code Status:     Code Status: Full Code  Disposition: Likely to skilled nursing facility/rehab.  Status is: Inpatient  Remains inpatient appropriate because: Falls, possible need for rehabilitation, COVID infection   Family Communication: None at bedside  Consultants:  None  Procedures:  None  Antimicrobials:  Doxycycline,  molnupiravir  Anti-infectives (From admission, onward)    Start     Dose/Rate Route Frequency Ordered Stop   06/03/22 2200  doxycycline (VIBRA-TABS) tablet 100 mg        100 mg Oral Every 12 hours 06/03/22 1551     06/03/22 1600  molnupiravir EUA (LAGEVRIO) capsule 800 mg  4 capsule Oral 2 times daily 06/03/22 1532 06/08/22 2159        Subjective: Today, Ronald Hampton was seen and examined at bedside.  Ronald Hampton complains of lower abdominal pain.  Complains of weakness over his lower extremities.  Mild cough but no dyspnea or shortness of breath.  Objective: Vitals:   06/03/22 1545 06/03/22 1621 06/03/22 2130 06/04/22 0425  BP: 126/86 (!) 128/90 131/86 (!) 125/91  Pulse: 86 97 80 81  Resp: _0 Temp:  98 F (36.7 C) 98.4 F (36.9 C) 99 F (37.2 C)  TempSrc:   Oral Oral  SpO2: 100% 100% 99% 99%  Weight:  86.3 kg    Height:  _1  (1.753 m)      Intake/Output Summary (Last 24 hours) at 06/04/2022 0734 Last data filed at 06/04/2022 1025 Gross per 24  hour  Intake 2917.12 ml  Output 1125 ml  Net 1792.12 ml   Filed Weights   06/03/22 0006 06/03/22 1621  Weight: 107 kg 86.3 kg    Physical Examination: Body mass index is 28.1 kg/m.   General:  Average built, not in obvious distress, appears chronically ill. HENT:   No scleral pallor or icterus noted. Oral mucosa is moist.  Poor dentition. Chest:  Clear breath sounds.  Diminished breath sounds bilaterally. No crackles or wheezes.  CVS: S1 &S2 heard. No murmur.  Regular rate and rhythm. Abdomen: Soft, nontender, nondistended.  Bowel sounds are heard.   Extremities: No cyanosis, clubbing or edema.  Peripheral pulses are palpable. Psych: Alert, awake and oriented, mildly anxious, CNS:  No cranial nerve deficits functional paraplegia decreased muscle Skin: Warm and dry.  No rashes noted.  Data Reviewed:   CBC: Recent Labs  Lab 06/03/22 0014  WBC 10.4  NEUTROABS 7.3  HGB 12.5*  HCT 36.0*  MCV 84.3  PLT 852    Basic Metabolic Panel: Recent Labs  Lab 06/03/22 0014  NA 138  K 3.1*  CL 97*  CO2 24  GLUCOSE 82  BUN 43*  CREATININE 2.35*  CALCIUM 11.3*    Liver Function Tests: Recent Labs  Lab 06/03/22 0014  AST 49*  ALT 52*  ALKPHOS 53  BILITOT 1.9*  PROT 8.7*  ALBUMIN 3.9     Radiology Studies: CT Knee Left Wo Contrast  Result Date: 06/03/2022 CLINICAL DATA:  Knee trauma with occult injury suspected. EXAM: CT OF THE LEFT KNEE WITHOUT CONTRAST TECHNIQUE: Multidetector CT imaging of the left knee was performed according to the standard protocol. Multiplanar CT image reconstructions were also generated. RADIATION DOSE REDUCTION: This exam was performed according to the departmental dose-optimization program which includes automated exposure control, adjustment of the mA and/or kV according to Ronald Hampton size and/or use of iterative reconstruction technique. COMPARISON:  None Available. FINDINGS: Bones/Joint/Cartilage No acute fracture. Fragment at the medial  patella which is corticated and chronic. Given location, question prior lateral transient patellar dislocation including on the prior radiograph. Generalized degenerative marginal spurring and joint space narrowing especially along the lateral facet of the patellofemoral compartment. Subchondral lucency at the medial femoral condyle without discrete fracture line, also favored chronic. Trace knee joint fluid with regional anterior reticulation. Ligaments Suboptimally assessed by CT. Muscles and Tendons Intact extensor complex.  No major or muscular tear noted. Soft tissues Premature arterial calcification. IMPRESSION: 1. Anterior soft tissue swelling without fracture or dislocation. 2. 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 Electronically Signed   By: Gilford Silvius.D.  On: 06/03/2022 04:10   CT ABDOMEN PELVIS W CONTRAST  Result Date: 06/03/2022 CLINICAL DATA:  Sepsis. EXAM: CT ABDOMEN AND PELVIS WITH CONTRAST TECHNIQUE: Multidetector CT imaging of the abdomen and pelvis was performed using the standard protocol following bolus administration of intravenous contrast. RADIATION DOSE REDUCTION: This exam was performed according to the departmental dose-optimization program which includes automated exposure control, adjustment of the mA and/or kV according to Ronald Hampton size and/or use of iterative reconstruction technique. CONTRAST:  48m OMNIPAQUE IOHEXOL 350 MG/ML SOLN COMPARISON:  CT abdomen and pelvis 05/23/2010 FINDINGS: Lower chest: No acute abnormality. Hepatobiliary: No focal liver abnormality is seen. No gallstones, gallbladder wall thickening, or biliary dilatation. Pancreas: Unremarkable. No pancreatic ductal dilatation or surrounding inflammatory changes. Spleen: Moderately enlarged, unchanged. Adrenals/Urinary Tract: Right renal cortical cyst with peripheral calcifications again seen measuring 8 mm, unchanged. Otherwise, the kidneys, adrenal glands, and  bladder are within normal limits. Stomach/Bowel: Stomach is within normal limits. Appendix appears normal. No evidence of bowel wall thickening, distention, or inflammatory changes. There is a large amount of stool throughout the entire colon. Vascular/Lymphatic: No significant vascular findings are present. No enlarged abdominal or pelvic lymph nodes. Reproductive: Prostate is unremarkable. Other: No abdominal wall hernia or abnormality. No abdominopelvic ascites. Musculoskeletal: There is new vertebral body height loss and fusion at the L1-L2 vertebral bodies with kyphosis at this level. Findings are likely related to old injury. This is not seen in 2011. There is mild chronic appearing compression deformity of the superior endplate of L4 which is also new from 2011. 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 There are new degenerative changes at L5-S1. IMPRESSION: 1. No acute localizing process in the abdomen or pelvis. 2. Large amount of stool throughout the colon. 3. Stable moderate splenomegaly. 4. Chronic appearing fractures at L1, L2 and L4 appear new from 2011. Please correlate clinically. 5. 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 Electronically Signed   By: ARonney AstersM.D.   On: 06/03/2022 01:49   CT Head Wo Contrast  Result Date: 06/03/2022 CLINICAL DATA:  Fall, paraplegic EXAM: CT HEAD WITHOUT CONTRAST CT CERVICAL SPINE WITHOUT CONTRAST TECHNIQUE: Multidetector CT imaging of the head and cervical spine was performed following the standard protocol without intravenous contrast. Multiplanar CT image reconstructions of the cervical spine were also generated. RADIATION DOSE REDUCTION: This exam was performed according to the departmental dose-optimization program which includes automated exposure control, adjustment of the mA and/or kV according to Ronald Hampton size and/or use of iterative reconstruction technique. COMPARISON:  MRI cervical spine  dated 11/21/2020. CT head dated 11/21/2020. FINDINGS: CT HEAD FINDINGS Brain: No evidence of acute infarction, hemorrhage, hydrocephalus, extra-axial collection or mass lesion/mass effect. Vascular: No hyperdense vessel or unexpected calcification. Skull: Normal. Negative for fracture or focal lesion. Sinuses/Orbits: The visualized paranasal sinuses are essentially clear. The mastoid air cells are unopacified. Other: None. CT CERVICAL SPINE FINDINGS Alignment: Reversal of the normal lower cervical lordosis. Skull base and vertebrae: No acute fracture. No primary bone lesion or focal pathologic process. C6-T1 fusion. Soft tissues and spinal canal: No prevertebral fluid or swelling. No visible canal hematoma. Disc levels: Degenerative changes at C6-T1. Spinal canal is patent. Upper chest: Visualized lung apices are clear. Other: Visualized thyroid is unremarkable. IMPRESSION: Normal head CT. No traumatic injury to the cervical spine. Degenerative changes with fusion at C6-T1. Electronically Signed   By: SJulian HyM.D.   On: 06/03/2022 01:46   CT Cervical Spine Wo Contrast  Result Date: 06/03/2022 CLINICAL DATA:  Fall, paraplegic EXAM: CT HEAD WITHOUT CONTRAST CT CERVICAL SPINE WITHOUT CONTRAST TECHNIQUE: Multidetector CT imaging of the head and cervical spine was performed following the standard protocol without intravenous contrast. Multiplanar CT image reconstructions of the cervical spine were also generated. RADIATION DOSE REDUCTION: This exam was performed according to the departmental dose-optimization program which includes automated exposure control, adjustment of the mA and/or kV according to Ronald Hampton size and/or use of iterative reconstruction technique. COMPARISON:  MRI cervical spine dated 11/21/2020. CT head dated 11/21/2020. FINDINGS: CT HEAD FINDINGS Brain: No evidence of acute infarction, hemorrhage, hydrocephalus, extra-axial collection or mass lesion/mass effect. Vascular: No hyperdense  vessel or unexpected calcification. Skull: Normal. Negative for fracture or focal lesion. Sinuses/Orbits: The visualized paranasal sinuses are essentially clear. The mastoid air cells are unopacified. Other: None. CT CERVICAL SPINE FINDINGS Alignment: Reversal of the normal lower cervical lordosis. Skull base and vertebrae: No acute fracture. No primary bone lesion or focal pathologic process. C6-T1 fusion. Soft tissues and spinal canal: No prevertebral fluid or swelling. No visible canal hematoma. Disc levels: Degenerative changes at C6-T1. Spinal canal is patent. Upper chest: Visualized lung apices are clear. Other: Visualized thyroid is unremarkable. IMPRESSION: Normal head CT. No traumatic injury to the cervical spine. Degenerative changes with fusion at C6-T1. Electronically Signed   By: Julian Hy M.D.   On: 06/03/2022 01:46   DG Knee Complete 4 Views Right  Result Date: 06/03/2022 CLINICAL DATA:  Fever EXAM: RIGHT KNEE - COMPLETE 4+ VIEW COMPARISON:  None Available. FINDINGS: No evidence of fracture, dislocation, or joint effusion. No evidence of arthropathy or other focal bone abnormality. Soft tissues are unremarkable. IMPRESSION: Negative. Electronically Signed   By: Ulyses Jarred M.D.   On: 06/03/2022 01:13   DG Knee Complete 4 Views Left  Result Date: 06/03/2022 CLINICAL DATA:  Fever EXAM: LEFT KNEE - COMPLETE 4+ VIEW COMPARISON:  None Available. FINDINGS: No acute fracture. There is lateral subluxation of the patella. Femorotibial joint space is normal. No knee effusion. IMPRESSION: Lateral subluxation of the left patella.  No acute fracture. Electronically Signed   By: Ulyses Jarred M.D.   On: 06/03/2022 01:11   DG Chest Portable 1 View  Result Date: 06/03/2022 CLINICAL DATA:  Fever EXAM: PORTABLE CHEST 1 VIEW COMPARISON:  None Available. FINDINGS: The heart size and mediastinal contours are within normal limits. Both lungs are clear. The visualized skeletal structures are  unremarkable. IMPRESSION: No active disease. Electronically Signed   By: Ulyses Jarred M.D.   On: 06/03/2022 01:09      LOS: 1 day    Time spent: 50 minutes spent on chart review, discussion with nursing staff, consultants, updating family and interview/physical exam; more than 50% of that time was spent in counseling and/or coordination of care.  Flora Lipps, MD [facility] Available via Epic secure chat 7am-7pm After these hours, please refer to coverage provider listed on [email or web address] 06/04/2022, 7:34 AM

## 2022-06-05 ENCOUNTER — Inpatient Hospital Stay (HOSPITAL_COMMUNITY): Payer: No Typology Code available for payment source

## 2022-06-05 DIAGNOSIS — D472 Monoclonal gammopathy: Secondary | ICD-10-CM | POA: Diagnosis not present

## 2022-06-05 DIAGNOSIS — W19XXXA Unspecified fall, initial encounter: Secondary | ICD-10-CM | POA: Diagnosis not present

## 2022-06-05 DIAGNOSIS — G7281 Critical illness myopathy: Secondary | ICD-10-CM | POA: Diagnosis not present

## 2022-06-05 DIAGNOSIS — K5900 Constipation, unspecified: Secondary | ICD-10-CM | POA: Diagnosis not present

## 2022-06-05 LAB — CBC
HCT: 27.9 % — ABNORMAL LOW (ref 39.0–52.0)
Hemoglobin: 10.1 g/dL — ABNORMAL LOW (ref 13.0–17.0)
MCH: 29.9 pg (ref 26.0–34.0)
MCHC: 36.2 g/dL — ABNORMAL HIGH (ref 30.0–36.0)
MCV: 82.5 fL (ref 80.0–100.0)
Platelets: 122 10*3/uL — ABNORMAL LOW (ref 150–400)
RBC: 3.38 MIL/uL — ABNORMAL LOW (ref 4.22–5.81)
RDW: 14.7 % (ref 11.5–15.5)
WBC: 6.3 10*3/uL (ref 4.0–10.5)
nRBC: 0 % (ref 0.0–0.2)

## 2022-06-05 LAB — BASIC METABOLIC PANEL
Anion gap: 9 (ref 5–15)
BUN: 19 mg/dL (ref 6–20)
CO2: 23 mmol/L (ref 22–32)
Calcium: 9 mg/dL (ref 8.9–10.3)
Chloride: 101 mmol/L (ref 98–111)
Creatinine, Ser: 1.59 mg/dL — ABNORMAL HIGH (ref 0.61–1.24)
GFR, Estimated: 56 mL/min — ABNORMAL LOW (ref >60)
Glucose, Bld: 92 mg/dL (ref 70–99)
Potassium: 3.5 mmol/L (ref 3.5–5.1)
Sodium: 133 mmol/L — ABNORMAL LOW (ref 135–145)

## 2022-06-05 LAB — MAGNESIUM: Magnesium: 1.4 mg/dL — ABNORMAL LOW (ref 1.7–2.4)

## 2022-06-05 MED ORDER — POTASSIUM CHLORIDE CRYS ER 20 MEQ PO TBCR
40.0000 meq | EXTENDED_RELEASE_TABLET | Freq: Once | ORAL | Status: AC
  Administered 2022-06-05: 40 meq via ORAL
  Filled 2022-06-05: qty 2

## 2022-06-05 MED ORDER — MAGNESIUM OXIDE -MG SUPPLEMENT 400 (240 MG) MG PO TABS
400.0000 mg | ORAL_TABLET | Freq: Two times a day (BID) | ORAL | Status: DC
  Administered 2022-06-05 – 2022-06-13 (×17): 400 mg via ORAL
  Filled 2022-06-05 (×17): qty 1

## 2022-06-05 MED ORDER — MAGNESIUM SULFATE 2 GM/50ML IV SOLN
2.0000 g | Freq: Once | INTRAVENOUS | Status: AC
  Administered 2022-06-05: 2 g via INTRAVENOUS
  Filled 2022-06-05: qty 50

## 2022-06-05 NOTE — Plan of Care (Signed)
  Problem: Education: Goal: Knowledge of risk factors and measures for prevention of condition will improve Outcome: Progressing   Problem: Coping: Goal: Psychosocial and spiritual needs will be supported Outcome: Progressing   Problem: Respiratory: Goal: Will maintain a patent airway Outcome: Progressing Goal: Complications related to the disease process, condition or treatment will be avoided or minimized Outcome: Progressing   

## 2022-06-05 NOTE — Progress Notes (Signed)
Pt. Had 7 beat run of V-tach, asymptomatic, MD aware  Anastasio Auerbach

## 2022-06-05 NOTE — Progress Notes (Signed)
PROGRESS NOTE    Ronald Hampton  TGG:[id number] DOB: July 18, 1980 DOA: 06/02/2022 PCP: Patient, No Pcp Per    Brief Narrative:  Ronald Hampton is a 41 y.o. male with medical history significant of DM type II, chronic hepatitis C,  prior back surgeries, bipolar disorder, PTSD, testicular cancer, polysubstance abuse including prior IV drug use (methamphetamine and heroin presented to hospital after sustaining a fall out of the bed.    Of note patient had a completed hospital course at [facility]  from 5/19-11/3 when patient had attempted suicide by heroin overdose which he relates to son dying of SIDS.  CT imaging showed acute cerebellar ICH with IVH.  Patient was found to be septic and intubated for airway protection.  MRI of the brain had noted small subacute infarcts in the bilateral cerebellar hemispheres and had septic emboli as he was found to have MRSA bacteremia.  No vegetations were noted on TEE although initial heart function was reduced 45-50%, but repeat echo 12/2021 noted EF to be 60-65%.  Patient had been put on broad-spectrum antibiotics and evaluated by ID.  Found to have C6/7, C7/T1, L2/L3, and L5/S1 discitis and osteomyelitis as well as paraspinal abscess.  He had completed IV antibiotics and repeat imaging noted improvement in discitis and osteomyelitis with no evidence of abscess on 04/01/22.  Infectious disease had recommended him to continue on doxycycline 100 mg p.o. twice daily for suppression but was not taking it..  For drug abuse patient had been put on methadone, but he appears not to have been on this medication.  During hospitalization, patient had developed critical illness myopathy and had been unable to walk.  During that hospital stay patient was noted to have hyperkalemia and SPEP showed M-spike. Immunofixation showed serum immunofixation IgG Kappa spike.  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  He was scheduled to follow-up with oncology in the outpatient setting.  His creatinine was around 2.0 at discharge.  After he was discharged from the hospital he went to skilled nursing facility since he was unable to take care of himself.  Subsequently, patient had left the facility and went to live with his Aunt and was not taking medication.  He had had a fall 3 days prior to presentation and started having fevers.   In the ED, patient was noted to be tachycardic, tachypneic with elevated blood pressure.  Labs showed mild hypokalemia with creatinine elevated at 2.3, calcium 11.3, CK 291, and lactic acid within normal limits.  CT scan of the head and cervical spine noted no acute abnormality with fusion at C6-T1.  CT scan of the abdomen pelvis noted large amount of stool throughout the colon, stable splenomegaly, and and chronic appearing fractures at L1/L2/ L4.  CT of the left knee noted anterior soft tissue swelling without acute fracture or dislocation and advanced osteoarthritis.  Blood cultures were obtained.  Patient had been bolused 2 L of normal saline IV fluid, potassium chloride 10 mEq IV, Phenergan, and morphine for pain.  Patient also tested positive for COVID-19 infection.  Patient was then admitted hospital for further evaluation and treatment.    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  CK  291.  Physical therapy has seen the patient and recommended skilled nursing facility.  Continue gabapentin   SIRS Mildly febrile tachycardic tachypneic on presentation.  Patient with history of MRSA bacteremia, ESBL E.  Klebsiella, and osteomyelitis/discitis.  Patient was supposed to be on doxycycline suppression but was not taking it.  Has been restarted on doxycycline.  Blood cultures negative in 2 days.  Urine culture less than 10,000 colonies of insignificant growth.   COVID-19 infection Incidental with fever malaise.  No hypoxia.  Continue symptomatic care and. Molnupiravir   Hypercalcemia monoclonal gammopathy Calcium 11.3 on admission, has improved with IV fluids.  Patient has been seen by oncology as outpatient who has an impression of monoclonal gammopathy versus the possibility of POEMS.  Plan was EMG studies.  Patient follows up with hematology at [facility].    Mild acute kidney injury on chronic kidney disease stage stage IIIb Presenting creatinine at 2.3.  Baseline around 2.0..  Patient received IV hydration with improvement in creatinine level at 1.5 today.  Mild hypokalemia.  Improved latest potassium of 3.5.  We will continue magnesium supplementation and add oral potassium as well.  Aim for potassium level than 4.  Hypomagnesemia.  Magnesium level of 1.4 today.  Will receive p.o. magnesium oxide and IV magnesium sulfate.  Patient had few beats of nonsustained V. tach.  We will continue to monitor closely.  Keep magnesium level more than 2.  History of osteomyelitis/discitis, MRSA bacteremia Continue doxycycline for chronic suppression.  Initial proposed end date was 07-17-22.  Cultures negative in 2 days.   Constipation On  CT scan of the abdomen.  Continue stool softeners, MiraLAX.   Elevated LFTs/hyperbilirubinemia AST 49, ALT 52, and total bilirubin 1.9.  Improved ALT AST at this time.   Normocytic  anemia Latest hemoglobin of 9.5.  We will closely monitor.  Depression Continue Cymbalta   Chronic hepatitis C -Patient needs to follow-up with ID in the outpatient setting for treatment.   History of CVA history of ICH and IVH Continue aspirin  History of polysubstance abuse Patient with history of IV drug abuse including heroin and methamphetamines.  Urine drug screen positive for opiates and amphetamine at this time.  He was supposed to be on methadone not taking it.  Patient was counseled about it.  Stage I coccyx pressure ulceration.  Present on admission.  Continue prevention protocol.  Pressure Injury 06/03/22 Coccyx Mid Stage 1 -  Intact skin with non-blanchable redness of a localized area usually over a bony prominence. (Active)  06/03/22 1629  Location: Coccyx  Location Orientation: Mid  Staging: Stage 1 -  Intact skin with non-blanchable redness of a localized area usually over a bony prominence.  Wound Description (Comments):   Present on Admission: Yes      DVT prophylaxis: heparin injection 5,000 Units Start: 06/03/22 1400   Code Status:     Code Status: Full Code  Disposition:  Skilled therapy has recommended skilled nursing facility placement at this time.    Status is: Inpatient  Remains inpatient appropriate because:  Falls, need for skilled nursing facility placement., COVID infection   Family Communication: None at bedside  Consultants:  None  Procedures:  None  Antimicrobials:  Doxycycline,  molnupiravir  Anti-infectives (From admission, onward)    Start     Dose/Rate Route Frequency Ordered Stop   06/03/22 2200  doxycycline (VIBRA-TABS) tablet 100 mg        100 mg Oral Every 12 hours 06/03/22 1551     06/03/22 1600  molnupiravir EUA (LAGEVRIO) capsule 800 mg        4 capsule Oral 2 times daily 06/03/22 1532 06/08/22 2159      Subjective: Today, patient was seen and examined at bedside.  Complains of mild pain around the groin area.   Had recent fall at home.  No fever chills shortness of breath chest pain or palpitation.  Objective: Vitals:   06/04/22 1534 06/04/22 2038 06/05/22 0532 06/05/22 1102  BP: (!) 134/99 (!) 151/99 (!) 135/105 (!) 135/106  Pulse: 89 67 82   Resp: _0 Temp: 98.7 F (37.1 C) 97.8 F (36.6 C) 98.6 F (37 C) 98.6 F (37 C)  TempSrc: Oral Oral Oral Oral  SpO2: 99% 98% 100% 99%  Weight:  88.6 kg    Height:        Intake/Output Summary (Last 24 hours) at 06/05/2022 1402 Last data filed at 06/05/2022 0915 Gross per 24 hour  Intake 650 ml  Output 2450 ml  Net -1800 ml    Filed Weights   06/03/22 0006 06/03/22 1621 06/04/22 2038  Weight: 107 kg 86.3 kg 88.6 kg    Physical Examination: Body mass index is 28.84 kg/m.   General:  Average built, not in obvious distress chronically ill, HENT:   No scleral pallor or icterus noted. Oral mucosa is moist.  Poor dentition noted. Chest:  Clear breath sounds.  Diminished breath sounds bilaterally. No crackles or wheezes.  CVS: S1 &S2 heard. No murmur.  Regular rate and rhythm. Abdomen: Soft, nontender, nondistended.  Bowel sounds are heard.   Extremities: No cyanosis, clubbing or edema.  Peripheral pulses are palpable. Psych: Alert, awake and oriented, flat affect, Communicative CNS:  No cranial nerve deficits.  Functional paraplegia, decreased muscle tone. Skin: Warm and dry.  No rashes noted.   Data Reviewed:   CBC: Recent Labs  Lab 06/03/22 0014 06/04/22 0725 06/05/22 0519  WBC 10.4 6.6 6.3  NEUTROABS 7.3  --   --   HGB 12.5* 9.5* 10.1*  HCT 36.0* 26.7* 27.9*  MCV 84.3 82.9 82.5  PLT 153 119* 122*     Basic Metabolic Panel: Recent Labs  Lab 06/03/22 0014 06/04/22 0725 06/05/22 0519  NA 138 137 133*  K 3.1* 3.4* 3.5  CL 97* 110 101  CO2 24 19* 23  GLUCOSE 82 89 92  BUN 43* 26* 19  CREATININE 2.35* 1.90* 1.59*  CALCIUM 11.3* 8.8* 9.0  MG  --   --  1.4*     Liver Function Tests: Recent Labs  Lab  06/03/22 0014 06/04/22 0725  AST 49* 27  ALT 52* 31  ALKPHOS 53 42  BILITOT 1.9* 0.7  PROT 8.7* 6.1*  ALBUMIN 3.9 2.7*      Radiology Studies: No results found.    LOS: 2 days    Flora Lipps, MD [facility] Available via Epic secure chat 7am-7pm After these hours, please refer to coverage provider listed on [email or web address] 06/05/2022, 2:02 PM

## 2022-06-05 NOTE — Progress Notes (Signed)
PT Cancellation Note  Patient Details Name: Ronald Hampton MRN: [id number] DOB: [birth year]   Cancelled Treatment:    Reason Eval/Treat Not Completed: Other (comment)  Pt nauseated and refusing PT eval;   Will reattempt;   Roney Marion, [place] [phone]    Colletta [place] 06/05/2022, 2:35 PM

## 2022-06-06 DIAGNOSIS — U071 COVID-19: Secondary | ICD-10-CM | POA: Diagnosis not present

## 2022-06-06 LAB — CBC
HCT: 29.8 % — ABNORMAL LOW (ref 39.0–52.0)
Hemoglobin: 10.8 g/dL — ABNORMAL LOW (ref 13.0–17.0)
MCH: 30 pg (ref 26.0–34.0)
MCHC: 36.2 g/dL — ABNORMAL HIGH (ref 30.0–36.0)
MCV: 82.8 fL (ref 80.0–100.0)
Platelets: 177 10*3/uL (ref 150–400)
RBC: 3.6 MIL/uL — ABNORMAL LOW (ref 4.22–5.81)
RDW: 14.2 % (ref 11.5–15.5)
WBC: 7 10*3/uL (ref 4.0–10.5)
nRBC: 0 % (ref 0.0–0.2)

## 2022-06-06 LAB — BASIC METABOLIC PANEL
Anion gap: 10 (ref 5–15)
BUN: 21 mg/dL — ABNORMAL HIGH (ref 6–20)
CO2: 23 mmol/L (ref 22–32)
Calcium: 9.2 mg/dL (ref 8.9–10.3)
Chloride: 101 mmol/L (ref 98–111)
Creatinine, Ser: 1.52 mg/dL — ABNORMAL HIGH (ref 0.61–1.24)
GFR, Estimated: 59 mL/min — ABNORMAL LOW (ref >60)
Glucose, Bld: 90 mg/dL (ref 70–99)
Potassium: 3.7 mmol/L (ref 3.5–5.1)
Sodium: 134 mmol/L — ABNORMAL LOW (ref 135–145)

## 2022-06-06 LAB — MAGNESIUM: Magnesium: 1.9 mg/dL (ref 1.7–2.4)

## 2022-06-06 MED ORDER — ORAL CARE MOUTH RINSE: 15.0000 mL | OROMUCOSAL | Status: DC | PRN

## 2022-06-06 NOTE — Progress Notes (Signed)
PROGRESS NOTE    Ronald [place]  XNT:[id number] DOB: [birth year] DOA: 06/02/2022 PCP: Patient, No Pcp Per    Brief Narrative:   Ronald Hampton is a 41 y.o. male with past medical history significant for DM2, chronic hepatitis C,  prior back surgeries, bipolar disorder, PTSD, testicular cancer, polysubstance abuse including prior IV drug use (methamphetamine and heroin presented to hospital after sustaining a fall out of the bed.    Recent hospitalization at [facility]  from 5/19-11/3 when patient had attempted suicide by heroin overdose which he relates to son dying of SIDS.  CT imaging showed acute cerebellar ICH with IVH.  Patient was found to be septic and intubated for airway protection.  MRI of the brain had noted small subacute infarcts in the bilateral cerebellar hemispheres and had septic emboli as he was found to have MRSA bacteremia.  No vegetations were noted on TEE although initial heart function was reduced 45-50%, but repeat echo 12/2021 noted EF to be 60-65%.  Patient had been put on broad-spectrum antibiotics and evaluated by ID.  Found to have C6/7, C7/T1, L2/L3, and L5/S1 discitis and osteomyelitis as well as paraspinal abscess.  He had completed IV antibiotics and repeat imaging noted improvement in discitis and osteomyelitis with no evidence of abscess on 04/01/22.  Infectious disease had recommended him to continue on doxycycline 100 mg p.o. twice daily for suppression but was not taking it.  For drug abuse patient had been put on methadone, but he appears not to have been on this medication.  During hospitalization, patient had developed critical illness myopathy and had been unable to walk.  During that hospital stay patient was noted to have hyperkalemia and SPEP  showed M-spike. Immunofixation showed serum immunofixation IgG Kappa spike.  Patient underwent bone marrow biopsy on 05/10/2022 and was positive for possible multiple myeloma.  He was  scheduled to follow-up with oncology in the outpatient setting.  His creatinine was around 2.0 at discharge.  After he was discharged from the hospital he went to skilled nursing facility since he was unable to take care of himself. Subsequently, patient had left the facility and went to live with his Aunt and was not taking any of his medications.  He had had a fall 3 days prior to presentation and started having fevers.    In the ED, patient was noted to be tachycardic, tachypneic with elevated blood pressure.  Labs showed mild hypokalemia with creatinine elevated at 2.3, calcium 11.3, CK 291, and lactic acid within normal limits.  CT scan of the head and cervical spine noted no acute abnormality with fusion at C6-T1.  CT scan of the abdomen pelvis noted large amount of stool throughout the colon, stable splenomegaly, and and chronic appearing fractures at L1/L2/ L4.  CT of the left knee noted anterior soft tissue swelling without acute fracture or dislocation and advanced osteoarthritis.  Blood cultures were obtained.  Patient had been bolused 2 L of normal saline IV fluid, potassium chloride 10 mEq IV, Phenergan, and morphine for pain.  Patient also tested positive for COVID-19 infection.  Patient was then admitted hospital for further evaluation and treatment.  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  CK  291.  --Continue gabapentin  --PT/OT recommend SNF placement, TOC for assistance --Continue therapy efforts while inpatient   SIRS Mildly febrile tachycardic tachypneic on presentation.  Patient with  history of MRSA bacteremia, ESBL E.  Klebsiella, and osteomyelitis/discitis.  Patient was supposed to be on doxycycline suppression but was not taking it.   --Blood cultures: No growth x 2 days --Urine culture less than 10,000 colonies of insignificant growth. --Doxycycline 100 mg p.o. twice daily    COVID-19 viral infection Incidental finding with fever malaise.  No hypoxia.  Fever is now resolved. --Molnupiravir x 5 day course --Supportive care --Continue airborne/contact isolation for 10 days from date of diagnosis (11/19)   Hypercalcemia/monoclonal gammopathy Calcium 11.3 on admission, has improved with IV fluids.  Patient has been seen by oncology as outpatient who has an impression of monoclonal gammopathy versus the possibility of POEMS.  Plan was EMG studies.  Patient follows up with hematology at [facility].  Continue outpatient follow-up   Mild acute kidney injury on chronic kidney disease stage stage IIIb Presenting creatinine at 2.3.  Baseline around 2.0..  Patient received IV hydration with improvement in creatinine level at 1.52 today.   Mild hypokalemia.   Repleted during hospitalization, potassium 3.7 today.   Hypomagnesemia.  Repleted during hospitalization, magnesium 1.9 today.  History of osteomyelitis/discitis, MRSA bacteremia Continue doxycycline for chronic suppression.  Initial proposed end date was 07-17-22.   -- Blood cultures x 2: No growth x 3 days --Continue doxycycline 100 mg p.o. every 12 hours   Constipation Noted on CT scan of the abdomen.   --Continue Senokot-S1 tablet p.o. twice daily --MiraLAX p.o. daily   Elevated LFTs/hyperbilirubinemia: Resolved AST 49, ALT 52, and total bilirubin 1.9.  LFTs and bilirubin now have normalized.   Normocytic anemia Hemoglobin stable, 10.8.   Depression --Continue Cymbalta   Chronic hepatitis C --Outpatient follow-up with infectious disease on discharge for consideration of treatment outpatient.   History of CVA history of ICH and IVH Continue aspirin   History of polysubstance abuse Patient with history of IV drug abuse including heroin and methamphetamines.  Urine drug screen positive for opiates and amphetamine at this time.  He was supposed to be on methadone not taking it.  Patient was  counseled about it.   Stage I coccyx pressure ulceration, POA Pressure Injury 06/03/22 Coccyx Mid Stage 1 -  Intact skin with non-blanchable redness of a localized area usually over a bony prominence. (Active)  06/03/22 1629  Location: Coccyx  Location Orientation: Mid  Staging: Stage 1 -  Intact skin with non-blanchable redness of a localized area usually over a bony prominence.  Wound Description (Comments):   Present on Admission: Yes  -- Wound RN consulted.  Continue local wound care, offloading   DVT prophylaxis: heparin injection 5,000 Units Start: 06/03/22 1400    Code Status: Full Code Family Communication: No family present at bedside this morning  Disposition Plan:  Level of care: Telemetry Medical Status is: Inpatient Remains inpatient appropriate because: Pending SNF placement    Consultants:  none  Procedures:  none  Antimicrobials:   Molnupiravir 11/19>>   Subjective: Patient seen examined bedside, resting comfortably.  Lying in bed.  Just worked with PT.  Will need SNF placement.  Updated social work.  No other questions or concerns at this time.  Denies headache, no dizziness, no current fever, no chills/night sweats, no nausea/vomiting, no diarrhea, no chest pain, no palpitations, no abdominal pain, no cough/congestion.  No acute events overnight per nursing staff.  Objective: Vitals:   06/05/22 1700 06/05/22 2037 06/06/22 0454 06/06/22 0903  BP: (!) 135/92 (!) 140/100 (!) 137/109 (!) 137/107  Pulse: 100  77 92  Resp: _0 Temp: 99.2 F (37.3 C) 99 F (37.2 C) 98.2 F (36.8 C) 98.7 F (37.1 C)  TempSrc: Oral Oral Oral Oral  SpO2: 100% 100% 100% 99%  Weight:  86.9 kg    Height:        Intake/Output Summary (Last 24 hours) at 06/06/2022 1523 Last data filed at 06/06/2022 1213 Gross per 24 hour  Intake 865 ml  Output 2300 ml  Net -1435 ml   Filed Weights   06/03/22 1621 06/04/22 2038 06/05/22 2037  Weight: 86.3 kg 88.6 kg 86.9 kg     Examination:  Physical Exam: GEN: NAD, alert and oriented x 3, chronically ill appearance, appears older than stated age HEENT: NCAT, PERRL, EOMI, sclera clear, MMM, poor dentition PULM: CTAB w/o wheezes/crackles, normal respiratory effort, on room air CV: RRR w/o M/G/R GI: abd soft, NTND, NABS, no R/G/M MSK: no peripheral edema, moves all extremities independently; decreased muscle tone NEURO: CN II-XII intact, no focal deficits PSYCH: normal mood/affect Integumentary: dry/intact, no rashes or wounds    Data Reviewed: I have personally reviewed following labs and imaging studies  CBC: Recent Labs  Lab 06/03/22 0014 06/04/22 0725 06/05/22 0519 06/06/22 0422  WBC 10.4 6.6 6.3 7.0  NEUTROABS 7.3  --   --   --   HGB 12.5* 9.5* 10.1* 10.8*  HCT 36.0* 26.7* 27.9* 29.8*  MCV 84.3 82.9 82.5 82.8  PLT 153 119* 122* 498   Basic Metabolic Panel: Recent Labs  Lab 06/03/22 0014 06/04/22 0725 06/05/22 0519 06/06/22 0422  NA 138 137 133* 134*  K 3.1* 3.4* 3.5 3.7  CL 97* 110 101 101  CO2 24 19* 23 23  GLUCOSE 82 89 92 90  BUN 43* 26* 19 21*  CREATININE 2.35* 1.90* 1.59* 1.52*  CALCIUM 11.3* 8.8* 9.0 9.2  MG  --   --  1.4* 1.9   GFR: Estimated Creatinine Clearance: 70.5 mL/min (A) (by C-G formula based on SCr of 1.52 mg/dL (H)). Liver Function Tests: Recent Labs  Lab 06/03/22 0014 06/04/22 0725  AST 49* 27  ALT 52* 31  ALKPHOS 53 42  BILITOT 1.9* 0.7  PROT 8.7* 6.1*  ALBUMIN 3.9 2.7*   Recent Labs  Lab 06/03/22 0014  LIPASE 25   No results for input(s): "AMMONIA" in the last 168 hours. Coagulation Profile: No results for input(s): "INR", "PROTIME" in the last 168 hours. Cardiac Enzymes: Recent Labs  Lab 06/03/22 0014  CKTOTAL 291   BNP (last 3 results) No results for input(s): "PROBNP" in the last 8760 hours. HbA1C: No results for input(s): "HGBA1C" in the last 72 hours. CBG: No results for input(s): "GLUCAP" in the last 168 hours. Lipid  Profile: No results for input(s): "CHOL", "HDL", "LDLCALC", "TRIG", "CHOLHDL", "LDLDIRECT" in the last 72 hours. Thyroid Function Tests: No results for input(s): "TSH", "T4TOTAL", "FREET4", "T3FREE", "THYROIDAB" in the last 72 hours. Anemia Panel: No results for input(s): "VITAMINB12", "FOLATE", "FERRITIN", "TIBC", "IRON", "RETICCTPCT" in the last 72 hours. Sepsis Labs: Recent Labs  Lab 06/03/22 0014 06/03/22 0456  LATICACIDVEN 1.6 1.0    Recent Results (from the past 240 hour(s))  Culture, blood (routine x 2)     Status: None (Preliminary result)   Collection Time: 06/03/22 12:23 AM   Specimen: BLOOD RIGHT ARM  Result Value Ref Range Status   Specimen Description BLOOD RIGHT ARM  Final   Special Requests   Final    BOTTLES DRAWN AEROBIC AND  ANAEROBIC Blood Culture results may not be optimal due to an excessive volume of blood received in culture bottles   Culture   Final    NO GROWTH 3 DAYS Performed at [facility] [address] [place], [place] [address]    Report Status PENDING  Incomplete  Culture, blood (routine x 2)     Status: None (Preliminary result)   Collection Time: 06/03/22  4:56 AM   Specimen: BLOOD RIGHT HAND  Result Value Ref Range Status   Specimen Description BLOOD RIGHT HAND  Final   Special Requests   Final    BOTTLES DRAWN AEROBIC ONLY Blood Culture adequate volume   Culture   Final    NO GROWTH 3 DAYS Performed at [facility], [address], [place], [place] [address]    Report Status PENDING  Incomplete  Resp Panel by RT-PCR (Flu A&B, Covid) Anterior Nasal Swab     Status: Abnormal   Collection Time: 06/03/22 12:44 PM   Specimen: Anterior Nasal Swab  Result Value Ref Range Status   SARS Coronavirus 2 by RT PCR POSITIVE (A) NEGATIVE Final    Comment: (NOTE) SARS-CoV-2 target nucleic acids are DETECTED.  The SARS-CoV-2 RNA is generally detectable in upper respiratory specimens during the acute phase of infection. Positive results  are indicative of the presence of the identified virus, but do not rule out bacterial infection or co-infection with other pathogens not detected by the test. Clinical correlation with patient history and other diagnostic information is necessary to determine patient infection status. The expected result is Negative.  Fact Sheet for Patients: EntrepreneurPulse.com.au  Fact Sheet for Healthcare Providers: IncredibleEmployment.be  This test is not yet approved or cleared by the [place] FDA and  has been authorized for detection and/or diagnosis of SARS-CoV-2 by FDA under an Emergency Use Authorization (EUA).  This EUA will remain in effect (meaning this test can be used) for the duration of  the COVID-19 declaration under Section 564(b)(1) of the A ct, 21 U.S.C. section 360bbb-3(b)(1), unless the authorization is terminated or revoked sooner.     Influenza A by PCR NEGATIVE NEGATIVE Final   Influenza B by PCR NEGATIVE NEGATIVE Final    Comment: (NOTE) The Xpert Xpress SARS-CoV-2/FLU/RSV plus assay is intended as an aid in the diagnosis of influenza from Nasopharyngeal swab specimens and should not be used as a sole basis for treatment. Nasal washings and aspirates are unacceptable for Xpert Xpress SARS-CoV-2/FLU/RSV testing.  Fact Sheet for Patients: EntrepreneurPulse.com.au  Fact Sheet for Healthcare Providers: IncredibleEmployment.be  This test is not yet approved or cleared by the [place] FDA and has been authorized for detection and/or diagnosis of SARS-CoV-2 by FDA under an Emergency Use Authorization (EUA). This EUA will remain in effect (meaning this test can be used) for the duration of the COVID-19 declaration under Section 564(b)(1) of the Act, 21 U.S.C. section 360bbb-3(b)(1), unless the authorization is terminated or revoked.  Performed at [facility], [place] [address],  [place], [place] [address]   Urine Culture     Status: Abnormal   Collection Time: 06/03/22  1:26 PM   Specimen: Urine, Catheterized  Result Value Ref Range Status   Specimen Description URINE, CATHETERIZED  Final   Special Requests NONE  Final   Culture (A)  Final    <10,000 COLONIES/mL INSIGNIFICANT GROWTH Performed at [facility], [place] [address] [place], [place] [address]    Report Status 06/04/2022 FINAL  Final         Radiology Studies: DG HIP UNILAT W OR W/O PELVIS 1V RIGHT  Result Date: 06/05/2022 CLINICAL DATA:  Hip pain EXAM: DG HIP (WITH OR WITHOUT PELVIS) 1V RIGHT COMPARISON:  CT 11/21/2020 FINDINGS: There is no evidence of acute fracture. There is mild right hip osteoarthritis. Heterotopic ossification inferior to the left hip. IMPRESSION: No evidence of acute fracture. Mild osteoarthritis of the right hip. Electronically Signed   By: Maurine Simmering M.D.   On: 06/05/2022 16:48        Scheduled Meds:  vitamin C  500 mg Oral Daily   aspirin EC  81 mg Oral Daily   doxycycline  100 mg Oral Q12H   DULoxetine  40 mg Oral BID   gabapentin  300 mg Oral BID   heparin  5,000 Units Subcutaneous Q8H   lidocaine  1 patch Transdermal Q24H   magnesium oxide  400 mg Oral BID   molnupiravir EUA  4 capsule Oral BID   polyethylene glycol  17 g Oral Daily   senna-docusate  1 tablet Oral BID   sodium chloride flush  3 mL Intravenous Q12H   zinc sulfate  220 mg Oral Daily   Continuous Infusions:  promethazine (PHENERGAN) injection (IM or IVPB) 12.5 mg (06/06/22 1026)     LOS: 3 days    Time spent: 49 minutes spent on chart review, discussion with nursing staff, consultants, updating family and interview/physical exam; more than 50% of that time was spent in counseling and/or coordination of care.    Zanylah Hardie J [place] DO [facility] Available via Epic secure chat 7am-7pm After these hours, please refer to coverage provider listed on [email or web address] 06/06/2022, 3:23 PM

## 2022-06-06 NOTE — Plan of Care (Signed)
  Problem: Coping: Goal: Psychosocial and spiritual needs will be supported Outcome: Completed/Met

## 2022-06-06 NOTE — TOC Progression Note (Signed)
Transition of Care [facility]) - Initial/Assessment Note    Patient Details  Name: Ronald [place] MRN: [id number] Date of Birth: 01/14/1981  Transition of Care [facility]) CM/SW Contact:    Milinda Antis, [place] Phone Number: 06/06/2022, 3:26 PM  Clinical Narrative:                 LCSW attempted to reach the patient via phone (both cell and room)  to discuss discharge planning as the patient is COVID+.  There was no answer.  LCSW will coordinate with nursing staff to get in contact with patient.     Patient Goals and CMS Choice        Expected Discharge Plan and Services                                                Prior Living Arrangements/Services                       Activities of Daily Living   ADL Screening (condition at time of admission) Patient's cognitive ability adequate to safely complete daily activities?: Yes Is the patient deaf or have difficulty hearing?: No Does the patient have difficulty seeing, even when wearing glasses/contacts?: No Does the patient have difficulty concentrating, remembering, or making decisions?: No Patient able to express need for assistance with ADLs?: Yes Does the patient have difficulty dressing or bathing?: Yes Independently performs ADLs?: No Communication: Independent Dressing (OT): Needs assistance Is this a change from baseline?: Pre-admission baseline Grooming: Needs assistance Is this a change from baseline?: Pre-admission baseline Feeding: Independent Bathing: Needs assistance Is this a change from baseline?: Pre-admission baseline Toileting: Needs assistance Is this a change from baseline?: Pre-admission baseline In/Out Bed: Needs assistance Is this a change from baseline?: Pre-admission baseline Walks in Home: Dependent Is this a change from baseline?: Pre-admission baseline Weakness of Legs: Both Weakness of Arms/Hands: Both  Permission Sought/Granted                  Emotional  Assessment              Admission diagnosis:  Dehydration [E86.0] Fall [W19.XXXA] AKI (acute kidney injury) ([place]) [N17.9] Fall, initial encounter [W19.XXXA] Fever, unspecified fever cause [R50.9] Patient Active Problem List   Diagnosis Date Noted   Pressure injury of skin 06/04/2022   Fall 06/03/2022   Acute kidney injury superimposed on chronic kidney disease ([place]) 06/03/2022   SIRS (systemic inflammatory response syndrome) ([place]) 06/03/2022   COVID-19 virus infection 06/03/2022   Hypercalcemia 06/03/2022   Monoclonal gammopathy 06/03/2022   Constipation 06/03/2022   History of discitis 06/03/2022   History of bacteremia 06/03/2022   History of osteomyelitis 06/03/2022   Transaminitis 06/03/2022   Hyperbilirubinemia 06/03/2022   Normocytic anemia 06/03/2022   Depression 06/03/2022   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   Chronic hepatitis C ([place]) 06/03/2022   History of CVA (cerebrovascular accident) 06/03/2022   Polysubstance abuse ([place]) 06/03/2022   MDD (major depressive disorder), severe ([place]) 11/10/2017   DM (diabetes mellitus), type 2, uncontrolled 10/23/2017   HTN (hypertension) 10/23/2017   OCD (obsessive compulsive disorder) 10/23/2017   Bipolar I disorder, current or most recent episode depressed, with psychotic features ([place]) 10/22/2017   PTSD (post-traumatic stress disorder) 10/22/2017   Opioid use disorder, moderate, dependence ([place]) 10/22/2017   Suicidal ideation 10/22/2017  PCP:  Patient, No Pcp Per Pharmacy:   [facility], [place] [facility] [address] [place] [address] Phone: [phone] Fax: [phone]     Social Determinants of Health (SDOH) Interventions    Readmission Risk Interventions     No data to display

## 2022-06-06 NOTE — Evaluation (Signed)
Physical Therapy Evaluation Patient Details Name: Ronald Hampton MRN: [id number] DOB: Aug 22, 1980 Today's Date: 06/06/2022  History of Present Illness  Pt is a 41 y/o male who presented after a fall out of bed at home. Pt admitted for SIRS, incidentally COVID+, and possible AKI. Pt with recent hospitalization at [facility] (May-Oct 2023) after attempted suicide by overdose resulting in multiple complications including cerebellar ICH, intubation, small cerebellar infarcts, and lumbar discitis and osteomyelitis as well as paraspinal abscess w/ IV antibiotics. PMH: bipolar 1 disorder, depression, PTSD, functional paraplegia  Clinical Impression   Pt admitted with above diagnosis. PTA, pt lives alone and prior to May 2023 hospitalization, was completely independent in all daily tasks. Since DC in Oct 2023, pt had been completing sliding board transfers to/from w/c with little assist at [facility] rehab. However, once pt returned home, pt unable to manage ADLs/transfers as home is not wheelchair accessible. Pt presents with deficits in B LE sensation, strength, and overall endurance.  Decr activity tolerance and participation, and declined mobility today beyond repositioning in the bed;  Pt currently with functional limitations due to the deficits listed below (see PT Problem List). Pt will benefit from skilled PT to increase their independence and safety with mobility to allow discharge to the venue listed below.          Recommendations for follow up therapy are one component of a multi-disciplinary discharge planning process, led by the attending physician.  Recommendations may be updated based on patient status, additional functional criteria and insurance authorization.  Follow Up Recommendations Skilled nursing-short term rehab (<3 hours/day) Can patient physically be transported by private vehicle: No    Assistance Recommended at Discharge Intermittent Supervision/Assistance  Patient can  return home with the following  A lot of help with walking and/or transfers;A lot of help with bathing/dressing/bathroom;Assistance with cooking/housework    Equipment Recommendations Wheelchair (measurements PT);Wheelchair cushion (measurements PT) (will consider sliding board and drop-arm bSC; may already have)  Recommendations for Other Services       Functional Status Assessment Patient has had a recent decline in their functional status and demonstrates the ability to make significant improvements in function in a reasonable and predictable amount of time.     Precautions / Restrictions Precautions Precautions: Fall Precaution Comments: Covid      Mobility  Bed Mobility               General bed mobility comments: able to slide self up towards [facility] with bed in flat position, and with min assist; declined all mobility beyond repositioning in bed    Transfers                   General transfer comment: declined all mobility beyond repositioning in bed    Ambulation/Gait                  Stairs            Wheelchair Mobility    Modified Rankin (Stroke Patients Only)       Balance                                             Pertinent Vitals/Pain Pain Assessment Pain Assessment: Faces Faces Pain Scale: Hurts little more Pain Location: lower abdomen Pain Descriptors / Indicators: Grimacing, Guarding Pain Intervention(s): Monitored during session  Home Living Family/patient expects to be discharged to:: Private residence Living Arrangements: Alone Available Help at Discharge: Family;Available PRN/intermittently Type of Home: Other(Comment) (condo) Home Access: Level entry       Home Layout: One level Home Equipment: Wheelchair - manual Additional Comments: left SNF rehab for his condo that is not w/c accessible. Pt reports he cannot walk or stand so was unable to get around at home    Prior Function Prior  Level of Function : Independent/Modified Independent;Driving             Mobility Comments: prior to May 2023 hospitalization, was completely independent. Since DC to SNF rehab in October, was using sliding board to transfer to/from w/c and doing well with that per pt. Once DC home from SNF, pt reports unable to manage, stayed in bed and used pull ups for toileting as he was unable to access bathroom       Hand Dominance   Dominant Hand: Right    Extremity/Trunk Assessment        Lower Extremity Assessment Lower Extremity Assessment: Generalized weakness;RLE deficits/detail;LLE deficits/detail RLE Deficits / Details: Unable to fully assess due to decr participation; able to actively flex/ext knee, limited range; able to actively dorsiflex/plantarflex ankle, limited range; positioned for dorsiflexion positioning in bed LLE Deficits / Details: Unable to fully assess due to decr participation; able to actively flex/ext knee, limited range; able to actively dorsiflex/plantarflex ankle, limited range; positioned for dorsiflexion positioning in bed       Communication   Communication: No difficulties  Cognition Arousal/Alertness: Awake/alert Behavior During Therapy: WFL for tasks assessed/performed, Flat affect Overall Cognitive Status: Within Functional Limits for tasks assessed                                          General Comments General comments (skin integrity, edema, etc.): O2 sats 99-100% on room air with pt positioned supine in bed; HR 82-99 bpm    Exercises Other Exercises Other Exercises: Taught pt incentive spirometry, and pt performed 3 inhalations up to 1250 cc   Assessment/Plan    PT Assessment Patient needs continued PT services  PT Problem List Decreased strength;Decreased range of motion;Decreased activity tolerance;Decreased balance;Decreased mobility;Decreased knowledge of use of DME;Decreased safety awareness;Decreased knowledge of  precautions       PT Treatment Interventions DME instruction;Gait training;Functional mobility training;Therapeutic activities;Therapeutic exercise;Balance training;Neuromuscular re-education;Cognitive remediation;Patient/family education;Wheelchair mobility training    PT Goals (Current goals can be found in the Care Plan section)  Acute Rehab PT Goals Patient Stated Goal: to stay in the bed PT Goal Formulation: Patient unable to participate in goal setting Time For Goal Achievement: 06/20/22 Potential to Achieve Goals: Fair    Frequency Min 2X/week     Co-evaluation               AM-PAC PT "6 Clicks" Mobility  Outcome Measure Help needed turning from your back to your side while in a flat bed without using bedrails?: A Lot Help needed moving from lying on your back to sitting on the side of a flat bed without using bedrails?: A Lot Help needed moving to and from a bed to a chair (including a wheelchair)?: Total Help needed standing up from a chair using your arms (e.g., wheelchair or bedside chair)?: Total Help needed to walk in hospital room?: Total   6 Click Score: 7  End of Session   Activity Tolerance: Other (comment) (Reports feels back and is declining all mobility beyond repositioning in bed) Patient left: in bed;with call bell/phone within reach;with bed alarm set   PT Visit Diagnosis: Muscle weakness (generalized) (M62.81);Other abnormalities of gait and mobility (R26.89)    Time: 3754-3606 PT Time Calculation (min) (ACUTE ONLY): 16 min   Charges:   PT Evaluation $PT Eval Moderate Complexity: [place], [place] Office [phone]   Colletta [place] 06/06/2022, 12:53 PM

## 2022-06-06 NOTE — Plan of Care (Signed)
  Problem: Education: Goal: Knowledge of risk factors and measures for prevention of condition will improve Outcome: Completed/Met   

## 2022-06-07 DIAGNOSIS — U071 COVID-19: Secondary | ICD-10-CM | POA: Diagnosis not present

## 2022-06-07 NOTE — TOC Progression Note (Signed)
Transition of Care [facility]) - Initial/Assessment Note    Patient Details  Name: Ronald Hampton MRN: [id number] Date of Birth: 1981-03-17  Transition of [facility] CM/SW Contact:    Milinda Antis, [place] Phone Number: 06/07/2022, 1:53 PM  Clinical Narrative:                 LCSW attempted to reach patient again today via phone due to positive COVID status.  There was no answer on the room or cell phone.  Patient is on airborne/contact isolation for 10 days from date of diagnosis (11/19).   Patient would not be able to admit to SNF until 11/30 if agreeable.     TOC will continue to follow.     Patient Goals and CMS Choice        Expected Discharge Plan and Services                                                Prior Living Arrangements/Services                       Activities of Daily Living   ADL Screening (condition at time of admission) Patient's cognitive ability adequate to safely complete daily activities?: Yes Is the patient deaf or have difficulty hearing?: No Does the patient have difficulty seeing, even when wearing glasses/contacts?: No Does the patient have difficulty concentrating, remembering, or making decisions?: No Patient able to express need for assistance with ADLs?: Yes Does the patient have difficulty dressing or bathing?: Yes Independently performs ADLs?: No Communication: Independent Dressing (OT): Needs assistance Is this a change from baseline?: Pre-admission baseline Grooming: Needs assistance Is this a change from baseline?: Pre-admission baseline Feeding: Independent Bathing: Needs assistance Is this a change from baseline?: Pre-admission baseline Toileting: Needs assistance Is this a change from baseline?: Pre-admission baseline In/Out Bed: Needs assistance Is this a change from baseline?: Pre-admission baseline Walks in Home: Dependent Is this a change from baseline?: Pre-admission baseline Weakness  of Legs: Both Weakness of Arms/Hands: Both  Permission Sought/Granted                  Emotional Assessment              Admission diagnosis:  Dehydration [E86.0] Fall [W19.XXXA] AKI (acute kidney injury) ([place]) [N17.9] Fall, initial encounter [W19.XXXA] Fever, unspecified fever cause [R50.9] Patient Active Problem List   Diagnosis Date Noted   Pressure injury of skin 06/04/2022   Fall 06/03/2022   Acute kidney injury superimposed on chronic kidney disease ([place]) 06/03/2022   SIRS (systemic inflammatory response syndrome) ([place]) 06/03/2022   COVID-19 virus infection 06/03/2022   Hypercalcemia 06/03/2022   Monoclonal gammopathy 06/03/2022   Constipation 06/03/2022   History of discitis 06/03/2022   History of bacteremia 06/03/2022   History of osteomyelitis 06/03/2022   Transaminitis 06/03/2022   Hyperbilirubinemia 06/03/2022   Normocytic anemia 06/03/2022   Depression 06/03/2022   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   Chronic hepatitis C ([place]) 06/03/2022   History of CVA (cerebrovascular accident) 06/03/2022   Polysubstance abuse ([place]) 06/03/2022   MDD (major depressive disorder), severe ([place]) 11/10/2017   DM (diabetes mellitus), type 2, uncontrolled 10/23/2017   HTN (hypertension) 10/23/2017   OCD (obsessive compulsive disorder) 10/23/2017   Bipolar I disorder, current or most recent episode depressed, with psychotic features (  [place]) 10/22/2017   PTSD (post-traumatic stress disorder) 10/22/2017   Opioid use disorder, moderate, dependence ([place]) 10/22/2017   Suicidal ideation 10/22/2017   PCP:  Patient, No Pcp Per Pharmacy:   [place], [place] - [address] [place] [address] Phone: [phone] Fax: [phone]     Social Determinants of Health (SDOH) Interventions    Readmission Risk Interventions     No data to display

## 2022-06-07 NOTE — Progress Notes (Signed)
PROGRESS NOTE    Ronald Hampton  XNT:[id number] DOB: [birth year] DOA: 06/02/2022 PCP: Ronald Hampton, Ronald Hampton    Brief Narrative:   Ronald Hampton is a 41 y.o. male with past medical history significant for DM2, chronic hepatitis C,  prior back surgeries, bipolar disorder, PTSD, testicular cancer, polysubstance abuse including prior IV drug use (methamphetamine and heroin presented to hospital after sustaining a fall out of the bed.    Recent hospitalization at [facility]  from 5/19-11/3 when Ronald Hampton had attempted suicide by heroin overdose which he relates to son dying of SIDS.  CT imaging showed acute cerebellar ICH with IVH.  Ronald Hampton was found to be septic and intubated for airway protection.  MRI of the brain had noted small subacute infarcts in the bilateral cerebellar hemispheres and had septic emboli as he was found to have MRSA bacteremia.  Ronald vegetations were noted on TEE although initial heart function was reduced 45-50%, but repeat echo 12/2021 noted EF to be 60-65%.  Ronald Hampton had been put on broad-spectrum antibiotics and evaluated by ID.  Found to have C6/7, C7/T1, L2/L3, and L5/S1 discitis and osteomyelitis as well as paraspinal abscess.  He had completed IV antibiotics and repeat imaging noted improvement in discitis and osteomyelitis with Ronald evidence of abscess on 04/01/22.  Infectious disease had recommended him to continue on doxycycline 100 mg p.o. twice daily for suppression but was not taking it.  For drug abuse Ronald Hampton had been put on methadone, but he appears not to have been on this medication.  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  During that hospital stay Ronald Hampton was noted to have hyperkalemia and SPEP  showed M-spike. Immunofixation showed serum immunofixation IgG Kappa spike.  Ronald Hampton underwent bone marrow biopsy on 05/10/2022 and was positive for possible multiple myeloma.  He was  scheduled to follow-up with oncology in the outpatient setting.  His creatinine was around 2.0 at discharge.  After he was discharged from the hospital he went to skilled nursing facility since he was unable to take care of himself. Subsequently, Ronald Hampton had left the facility and went to live with his Aunt and was not taking any of his medications.  He had had a fall 3 days prior to presentation and started having fevers.    In the ED, Ronald Hampton was noted to be tachycardic, tachypneic with elevated blood pressure.  Labs showed mild hypokalemia with creatinine elevated at 2.3, calcium 11.3, CK 291, and lactic acid within normal limits.  CT scan of the head and cervical spine noted Ronald acute abnormality with fusion at C6-T1.  CT scan of the abdomen pelvis noted large amount of stool throughout the colon, stable splenomegaly, and and chronic appearing fractures at L1/L2/ L4.  CT of the left knee noted anterior soft tissue swelling without acute fracture or dislocation and advanced osteoarthritis.  Blood cultures were obtained.  Ronald Hampton had been bolused 2 L of normal saline IV fluid, potassium chloride 10 mEq IV, Phenergan, and morphine for pain.  Ronald Hampton also tested positive for COVID-19 infection.  Ronald Hampton was then admitted hospital for further evaluation and treatment.  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  CK  291.  --Continue gabapentin  --PT/OT recommend SNF placement, TOC for assistance --Continue therapy efforts while inpatient   SIRS Mildly febrile tachycardic tachypneic on presentation.  Ronald Hampton with  history of MRSA bacteremia, ESBL E.  Klebsiella, and osteomyelitis/discitis.  Ronald Hampton was supposed to be on doxycycline suppression but was not taking it.   --Blood cultures: Ronald growth x 3 days --Urine culture less than 10,000 colonies of insignificant growth. --Doxycycline 100 mg p.o. twice daily    COVID-19 viral infection Incidental finding with fever malaise.  Ronald hypoxia.  Fever is now resolved. --Molnupiravir x 5 day course --Supportive care --Continue airborne/contact isolation for 10 days from date of diagnosis (11/19)   Hypercalcemia/monoclonal gammopathy Calcium 11.3 on admission, has improved with IV fluids.  Ronald Hampton has been seen by oncology as outpatient who has an impression of monoclonal gammopathy versus the possibility of POEMS.  Plan was EMG studies.  Ronald Hampton follows up with hematology at [facility].  Continue outpatient follow-up   Mild acute kidney injury on chronic kidney disease stage stage IIIb Presenting creatinine at 2.3.  Baseline around 2.0..  Ronald Hampton received IV hydration with improvement in creatinine level at 1.52 11/22.   Mild hypokalemia.   Repleted during hospitalization   Hypomagnesemia.  Repleted during hospitalization  History of osteomyelitis/discitis, MRSA bacteremia Continue doxycycline for chronic suppression.  Initial proposed end date was 07-17-22.   --Blood cultures x 2: Ronald growth x 3 days --Continue doxycycline 100 mg p.o. every 12 hours   Constipation Noted on CT scan of the abdomen.   --Continue Senokot-S1 tablet p.o. twice daily --MiraLAX p.o. daily   Elevated LFTs/hyperbilirubinemia: Resolved AST 49, ALT 52, and total bilirubin 1.9.  LFTs and bilirubin now have normalized.   Normocytic anemia Hemoglobin stable, 10.8.   Depression --Continue Cymbalta   Chronic hepatitis C --Outpatient follow-up with infectious disease on discharge for consideration of treatment outpatient.   History of CVA history of ICH and IVH Continue aspirin   History of polysubstance abuse Ronald Hampton with history of IV drug abuse including heroin and methamphetamines.  Urine drug screen positive for opiates and amphetamine at this time.  He was supposed to be on methadone not taking it.  Ronald Hampton was counseled about it.   Stage I coccyx pressure  ulceration, POA Pressure Injury 06/03/22 Coccyx Mid Stage 1 -  Intact skin with non-blanchable redness of a localized area usually over a bony prominence. (Active)  06/03/22 1629  Location: Coccyx  Location Orientation: Mid  Staging: Stage 1 -  Intact skin with non-blanchable redness of a localized area usually over a bony prominence.  Wound Description (Comments):   Present on Admission: Yes  -- Wound RN consulted.  Continue local wound care, offloading   DVT prophylaxis: heparin injection 5,000 Units Start: 06/03/22 1400    Code Status: Full Code Family Communication: Ronald family present at bedside this morning  Disposition Plan:  Level of care: Med-Surg Status is: Inpatient Remains inpatient appropriate because: Pending SNF placement    Consultants:  none  Procedures:  none  Antimicrobials:   Molnupiravir 11/19>>   Subjective: Ronald Hampton seen examined bedside, resting comfortably.  Lying in bed watching TV.  Ronald questions or concerns at this time.  Denies headache, Ronald dizziness, Ronald current fever, Ronald chills/night sweats, Ronald nausea/vomiting, Ronald diarrhea, Ronald chest pain, Ronald palpitations, Ronald abdominal pain, Ronald cough/congestion.  Ronald acute events overnight Hampton nursing staff.  Objective: Vitals:   06/06/22 2050 06/07/22 0538 06/07/22 0847 06/07/22 0957  BP: (!) 126/95 124/88 (!) 132/90 (!) 132/100  Pulse: 93 90 96 97  Resp: _0 Temp: 98.9 F (37.2 C) 98.6 F (37 C) 98.7 F (37.1  C) 98.9 F (37.2 C)  TempSrc: Oral Oral Oral Oral  SpO2:  98% 99% 98%  Weight:      Height:        Intake/Output Summary (Last 24 hours) at 06/07/2022 1318 Last data filed at 06/07/2022 1004 Gross Hampton 24 hour  Intake 645 ml  Output 2400 ml  Net -1755 ml   Filed Weights   06/03/22 1621 06/04/22 2038 06/05/22 2037  Weight: 86.3 kg 88.6 kg 86.9 kg    Examination:  Physical Exam: GEN: NAD, alert and oriented x 3, chronically ill appearance, appears older than stated age HEENT:  NCAT, PERRL, EOMI, sclera clear, MMM, poor dentition PULM: CTAB w/o wheezes/crackles, normal respiratory effort, on room air CV: RRR w/o M/G/R GI: abd soft, NTND, NABS, Ronald R/G/M MSK: Ronald peripheral edema, moves all extremities independently; decreased muscle tone NEURO: CN II-XII intact, Ronald focal deficits PSYCH: normal mood/affect Integumentary: dry/intact, Ronald rashes or wounds    Data Reviewed: I have personally reviewed following labs and imaging studies  CBC: Recent Labs  Lab 06/03/22 0014 06/04/22 0725 06/05/22 0519 06/06/22 0422  WBC 10.4 6.6 6.3 7.0  NEUTROABS 7.3  --   --   --   HGB 12.5* 9.5* 10.1* 10.8*  HCT 36.0* 26.7* 27.9* 29.8*  MCV 84.3 82.9 82.5 82.8  PLT 153 119* 122* 737   Basic Metabolic Panel: Recent Labs  Lab 06/03/22 0014 06/04/22 0725 06/05/22 0519 06/06/22 0422  NA 138 137 133* 134*  K 3.1* 3.4* 3.5 3.7  CL 97* 110 101 101  CO2 24 19* 23 23  GLUCOSE 82 89 92 90  BUN 43* 26* 19 21*  CREATININE 2.35* 1.90* 1.59* 1.52*  CALCIUM 11.3* 8.8* 9.0 9.2  MG  --   --  1.4* 1.9   GFR: Estimated Creatinine Clearance: 70.5 mL/min (A) (by C-G formula based on SCr of 1.52 mg/dL (H)). Liver Function Tests: Recent Labs  Lab 06/03/22 0014 06/04/22 0725  AST 49* 27  ALT 52* 31  ALKPHOS 53 42  BILITOT 1.9* 0.7  PROT 8.7* 6.1*  ALBUMIN 3.9 2.7*   Recent Labs  Lab 06/03/22 0014  LIPASE 25   Ronald results for input(s): "AMMONIA" in the last 168 hours. Coagulation Profile: Ronald results for input(s): "INR", "PROTIME" in the last 168 hours. Cardiac Enzymes: Recent Labs  Lab 06/03/22 0014  CKTOTAL 291   BNP (last 3 results) Ronald results for input(s): "PROBNP" in the last 8760 hours. HbA1C: Ronald results for input(s): "HGBA1C" in the last 72 hours. CBG: Ronald results for input(s): "GLUCAP" in the last 168 hours. Lipid Profile: Ronald results for input(s): "CHOL", "HDL", "LDLCALC", "TRIG", "CHOLHDL", "LDLDIRECT" in the last 72 hours. Thyroid Function Tests: Ronald  results for input(s): "TSH", "T4TOTAL", "FREET4", "T3FREE", "THYROIDAB" in the last 72 hours. Anemia Panel: Ronald results for input(s): "VITAMINB12", "FOLATE", "FERRITIN", "TIBC", "IRON", "RETICCTPCT" in the last 72 hours. Sepsis Labs: Recent Labs  Lab 06/03/22 0014 06/03/22 0456  LATICACIDVEN 1.6 1.0    Recent Results (from the past 240 hour(s))  Culture, blood (routine x 2)     Status: None (Preliminary result)   Collection Time: 06/03/22 12:23 AM   Specimen: BLOOD RIGHT ARM  Result Value Ref Range Status   Specimen Description BLOOD RIGHT ARM  Final   Special Requests   Final    BOTTLES DRAWN AEROBIC AND ANAEROBIC Blood Culture results may not be optimal due to an excessive volume of blood received in culture bottles   Culture  Final    Ronald GROWTH 3 DAYS Performed at [facility], [place] [address] [place], [place] [address]    Report Status PENDING  Incomplete  Culture, blood (routine x 2)     Status: None (Preliminary result)   Collection Time: 06/03/22  4:56 AM   Specimen: BLOOD RIGHT HAND  Result Value Ref Range Status   Specimen Description BLOOD RIGHT HAND  Final   Special Requests   Final    BOTTLES DRAWN AEROBIC ONLY Blood Culture adequate volume   Culture   Final    Ronald GROWTH 3 DAYS Performed at [facility], [address], [place], [place] [address]    Report Status PENDING  Incomplete  Resp Panel by RT-PCR (Flu A&B, Covid) Anterior Nasal Swab     Status: Abnormal   Collection Time: 06/03/22 12:44 PM   Specimen: Anterior Nasal Swab  Result Value Ref Range Status   SARS Coronavirus 2 by RT PCR POSITIVE (A) NEGATIVE Final    Comment: (NOTE) SARS-CoV-2 target nucleic acids are DETECTED.  The SARS-CoV-2 RNA is generally detectable in upper respiratory specimens during the acute phase of infection. Positive results are indicative of the presence of the identified virus, but do not rule out bacterial infection or co-infection with other pathogens  not detected by the test. Clinical correlation with Ronald Hampton history and other diagnostic information is necessary to determine Ronald Hampton infection status. The expected result is Negative.  Fact Sheet for Patients: EntrepreneurPulse.com.au  Fact Sheet for Healthcare Providers: IncredibleEmployment.be  This test is not yet approved or cleared by the [place] FDA and  has been authorized for detection and/or diagnosis of SARS-CoV-2 by FDA under an Emergency Use Authorization (EUA).  This EUA will remain in effect (meaning this test can be used) for the duration of  the COVID-19 declaration under Section 564(b)(1) of the A ct, 21 U.S.C. section 360bbb-3(b)(1), unless the authorization is terminated or revoked sooner.     Influenza A by PCR NEGATIVE NEGATIVE Final   Influenza B by PCR NEGATIVE NEGATIVE Final    Comment: (NOTE) The Xpert Xpress SARS-CoV-2/FLU/RSV plus assay is intended as an aid in the diagnosis of influenza from Nasopharyngeal swab specimens and should not be used as a sole basis for treatment. Nasal washings and aspirates are unacceptable for Xpert Xpress SARS-CoV-2/FLU/RSV testing.  Fact Sheet for Patients: EntrepreneurPulse.com.au  Fact Sheet for Healthcare Providers: IncredibleEmployment.be  This test is not yet approved or cleared by the [place] FDA and has been authorized for detection and/or diagnosis of SARS-CoV-2 by FDA under an Emergency Use Authorization (EUA). This EUA will remain in effect (meaning this test can be used) for the duration of the COVID-19 declaration under Section 564(b)(1) of the Act, 21 U.S.C. section 360bbb-3(b)(1), unless the authorization is terminated or revoked.  Performed at [facility], [place] [address] [place], [place] [address]   Urine Culture     Status: Abnormal   Collection Time: 06/03/22  1:26 PM   Specimen: Urine, Catheterized   Result Value Ref Range Status   Specimen Description URINE, CATHETERIZED  Final   Special Requests NONE  Final   Culture (A)  Final    <10,000 COLONIES/mL INSIGNIFICANT GROWTH Performed at [facility], [place] [address] [place], [place] [address]    Report Status 06/04/2022 FINAL  Final         Radiology Studies: DG HIP UNILAT W OR W/O PELVIS 1V RIGHT  Result Date:  06/05/2022 CLINICAL DATA:  Hip pain EXAM: DG HIP (WITH OR WITHOUT PELVIS) 1V RIGHT COMPARISON:  CT 11/21/2020 FINDINGS: There is Ronald evidence of acute fracture. There is mild right hip osteoarthritis. Heterotopic ossification inferior to the left hip. IMPRESSION: Ronald evidence of acute fracture. Mild osteoarthritis of the right hip. Electronically Signed   By: Maurine Simmering M.D.   On: 06/05/2022 16:48        Scheduled Meds:  vitamin C  500 mg Oral Daily   aspirin EC  81 mg Oral Daily   doxycycline  100 mg Oral Q12H   DULoxetine  40 mg Oral BID   gabapentin  300 mg Oral BID   heparin  5,000 Units Subcutaneous Q8H   lidocaine  1 patch Transdermal Q24H   magnesium oxide  400 mg Oral BID   molnupiravir EUA  4 capsule Oral BID   polyethylene glycol  17 g Oral Daily   senna-docusate  1 tablet Oral BID   sodium chloride flush  3 mL Intravenous Q12H   zinc sulfate  220 mg Oral Daily   Continuous Infusions:  promethazine (PHENERGAN) injection (IM or IVPB) 12.5 mg (06/06/22 1026)     LOS: 4 days    Time spent: 49 minutes spent on chart review, discussion with nursing staff, consultants, updating family and interview/physical exam; more than 50% of that time was spent in counseling and/or coordination of care.    Barton Want J [place] DO [facility] Available via Epic secure chat 7am-7pm After these hours, please refer to coverage provider listed on [email or web address] 06/07/2022, 1:18 PM

## 2022-06-08 DIAGNOSIS — U071 COVID-19: Secondary | ICD-10-CM | POA: Diagnosis not present

## 2022-06-08 LAB — CULTURE, BLOOD (ROUTINE X 2)
Culture: NO GROWTH
Culture: NO GROWTH
Special Requests: ADEQUATE

## 2022-06-08 MED ORDER — ZOLPIDEM TARTRATE 5 MG PO TABS
5.0000 mg | ORAL_TABLET | Freq: Every day | ORAL | Status: DC
  Administered 2022-06-08 – 2022-06-12 (×5): 5 mg via ORAL
  Filled 2022-06-08 (×5): qty 1

## 2022-06-08 NOTE — Progress Notes (Signed)
Physical Therapy Treatment Patient Details Name: Ronald Hampton MRN: [id number] DOB: [birth year] Today's Date: 06/08/2022   History of Present Illness Pt is a 41 y/o male who presented after a fall out of bed at home. Pt admitted for SIRS, incidentally COVID+, and possible AKI. Pt with recent hospitalization at [facility] (May-Oct 2023) after attempted suicide by overdose resulting in multiple complications including cerebellar ICH, intubation, small cerebellar infarcts, and lumbar discitis and osteomyelitis as well as paraspinal abscess w/ IV antibiotics. PMH: bipolar 1 disorder, depression, PTSD, functional paraplegia    PT Comments    Continuing work on functional mobility and activity tolerance;  Patient with good progress toward patient focused goals.  Patient willing to progress to sitting edge of bed with supervision, lateral scoot with Min Guard, and eventually stood at RW level with Mod A of 2.  PT asked if patient's Aunt could bring in his wheelchair, found out wheelchair is actually his uncle's and he is borrowing it; continue to recommend SNF level rehab to maximize his functional independence at wheelchair level in a handicapped apartment.    Recommendations for follow up therapy are one component of a multi-disciplinary discharge planning process, led by the attending physician.  Recommendations may be updated based on patient status, additional functional criteria and insurance authorization.  Follow Up Recommendations  Skilled nursing-short term rehab (<3 hours/day) Can patient physically be transported by private vehicle: No   Assistance Recommended at Discharge Intermittent Supervision/Assistance  Patient can return home with the following A lot of help with walking and/or transfers;A lot of help with bathing/dressing/bathroom;Assistance with cooking/housework   Equipment Recommendations  Wheelchair (measurements PT);Wheelchair cushion (measurements PT) (will  consider sliding board and drop-arm bSC; may already have)    Recommendations for Other Services       Precautions / Restrictions Precautions Precautions: Fall Precaution Comments: Covid Restrictions Weight Bearing Restrictions: No     Mobility  Bed Mobility Overal bed mobility: Needs Assistance Bed Mobility: Supine to Sit     Supine to sit: Supervision, Min guard     General bed mobility comments: Cues to self-monitor for activity tolerance    Transfers Overall transfer level: Needs assistance Equipment used: Rolling walker (2 wheels) Transfers: Sit to/from Stand, Bed to chair/wheelchair/BSC Sit to Stand: Mod assist, +2 physical assistance          Lateral/Scoot Transfers: Min guard General transfer comment: Heavy dependence on UEs to push/support; smooth scooting to drop-arm recliner    Ambulation/Gait                   Stairs             Wheelchair Mobility    Modified Rankin (Stroke Patients Only)       Balance     Sitting balance-Leahy Scale: Fair       Standing balance-Leahy Scale: Poor                              Cognition Arousal/Alertness: Awake/alert Behavior During Therapy: WFL for tasks assessed/performed, Flat affect Overall Cognitive Status: Within Functional Limits for tasks assessed                                          Exercises      General Comments  Pertinent Vitals/Pain Pain Assessment Pain Assessment: Faces Faces Pain Scale: Hurts little more Pain Location: back Pain Descriptors / Indicators: Aching Pain Intervention(s): Monitored during session    Home Living                          Prior Function            PT Goals (current goals can now be found in the care plan section) Acute Rehab PT Goals Patient Stated Goal: independence PT Goal Formulation: With patient Time For Goal Achievement: 06/20/22 Potential to Achieve Goals: Fair Progress  towards PT goals: Progressing toward goals    Frequency    Min 2X/week      PT Plan Current plan remains appropriate    Co-evaluation              AM-PAC PT "6 Clicks" Mobility   Outcome Measure  Help needed turning from your back to your side while in a flat bed without using bedrails?: A Little Help needed moving from lying on your back to sitting on the side of a flat bed without using bedrails?: A Little Help needed moving to and from a bed to a chair (including a wheelchair)?: A Little Help needed standing up from a chair using your arms (e.g., wheelchair or bedside chair)?: Total Help needed to walk in hospital room?: Total Help needed climbing 3-5 steps with a railing? : A Lot 6 Click Score: 13    End of Session Equipment Utilized During Treatment: Gait belt Activity Tolerance: Patient tolerated treatment well Patient left: in chair;with call bell/phone within reach Nurse Communication: Mobility status (and we discussed timing of back to bed) PT Visit Diagnosis: Muscle weakness (generalized) (M62.81);Other abnormalities of gait and mobility (R26.89)     Time: 1537-9432 PT Time Calculation (min) (ACUTE ONLY): 34 min  Charges:  $Therapeutic Activity: 8-22 mins                     Roney Marion, PT  Acute Rehabilitation Services Office [phone]    Ronald Hampton 06/08/2022, 5:22 PM

## 2022-06-08 NOTE — [place] FL2 (Signed)
[place] MEDICAID FL2 LEVEL OF CARE SCREENING TOOL     IDENTIFICATION  Patient Name: Ronald Hampton Birthdate: 1980/08/18 Sex: male Admission Date (Current Location): 06/02/2022  [facility] and [place] Number:  Herbalist and Address:  [facility] [place]. [facility], [place] [address], [place], [place] [address]      Provider Number: [id number]  Attending Physician Name and Address:  [place] Donnamarie Poag, DO  Relative Name and Phone Number:       Current Level of Care: Hospital Recommended Level of Care: [place] Prior Approval Number:    Date Approved/Denied:   PASRR Number: [id number]  Discharge Plan: SNF    Current Diagnoses: Patient Active Problem List   Diagnosis Date Noted   Pressure injury of skin 06/04/2022   Fall 06/03/2022   Acute kidney injury superimposed on chronic kidney disease ([place]) 06/03/2022   SIRS (systemic inflammatory response syndrome) ([place]) 06/03/2022   COVID-19 virus infection 06/03/2022   Hypercalcemia 06/03/2022   Monoclonal gammopathy 06/03/2022   Constipation 06/03/2022   History of discitis 06/03/2022   History of bacteremia 06/03/2022   History of osteomyelitis 06/03/2022   Transaminitis 06/03/2022   Hyperbilirubinemia 06/03/2022   Normocytic anemia 06/03/2022   Depression 06/03/2022   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   Chronic hepatitis C ([place]) 06/03/2022   History of CVA (cerebrovascular accident) 06/03/2022   Polysubstance abuse ([place]) 06/03/2022   MDD (major depressive disorder), severe ([place]) 11/10/2017   DM (diabetes mellitus), type 2, uncontrolled 10/23/2017   HTN (hypertension) 10/23/2017   OCD (obsessive compulsive disorder) 10/23/2017   Bipolar I disorder, current or most recent episode depressed, with psychotic features ([place]) 10/22/2017   PTSD (post-traumatic stress disorder) 10/22/2017   Opioid use disorder, moderate, dependence ([place]) 10/22/2017   Suicidal ideation 10/22/2017     Orientation RESPIRATION BLADDER Height & Weight     Self, Time, Situation, Place  Normal Incontinent Weight: 191 lb 9.3 oz (86.9 kg) Height:  '5\' 9"'$  (175.3 cm)  BEHAVIORAL SYMPTOMS/MOOD NEUROLOGICAL BOWEL NUTRITION STATUS      Incontinent Diet (see d/c summary)  AMBULATORY STATUS COMMUNICATION OF NEEDS Skin   Extensive Assist Verbally Other (Comment) (Non pressure anterior left ankle; skin tear Pretibial Distal (L))                       Personal Care Assistance Level of Assistance  Bathing, Feeding, Dressing Bathing Assistance: Limited assistance Feeding assistance: Independent Dressing Assistance: Limited assistance     Functional Limitations Info  Sight, Hearing, Speech Sight Info: Adequate Hearing Info: Adequate Speech Info: Adequate    SPECIAL CARE FACTORS FREQUENCY  PT (By licensed PT), OT (By licensed OT)     PT Frequency: 5x/ week OT Frequency: 5x/ week            Contractures Contractures Info: Not present    Additional Factors Info  Code Status, Allergies, Psychotropic Code Status Info: Full Allergies Info: Sulfa Antibiotics  Tomato  Trazodone And Nefazodone  Zofran (Ondansetron Hcl) Psychotropic Info: Ambien         Current Medications (06/08/2022):  This is the current hospital active medication list Current Facility-Administered Medications  Medication Dose Route Frequency Provider Last Rate Last Admin   acetaminophen (TYLENOL) tablet 650 mg  650 mg Oral Q6H PRN Fuller Plan A, MD   650 mg at 06/03/22 1242   Or   acetaminophen (TYLENOL) suppository 650 mg  650 mg Rectal Q6H PRN Fuller Plan A,  MD       albuterol (VENTOLIN HFA) 108 (90 Base) MCG/ACT inhaler 2 puff  2 puff Inhalation Q6H PRN Fuller Plan A, MD       ascorbic acid (VITAMIN C) tablet 500 mg  500 mg Oral Daily Smith, Rondell A, MD   500 mg at 06/08/22 1123   aspirin EC tablet 81 mg  81 mg Oral Daily Smith, Rondell A, MD   81 mg at 06/08/22 1124    chlorpheniramine-HYDROcodone (TUSSIONEX) 10-8 MG/5ML suspension 5 mL  5 mL Oral Q12H PRN Fuller Plan A, MD       doxycycline (VIBRA-TABS) tablet 100 mg  100 mg Oral Q12H Smith, Rondell A, MD   100 mg at 06/08/22 1124   DULoxetine (CYMBALTA) DR capsule 40 mg  40 mg Oral BID Fuller Plan A, MD   40 mg at 06/08/22 1123   gabapentin (NEURONTIN) capsule 300 mg  300 mg Oral BID Smith, Rondell A, MD   300 mg at 06/08/22 1124   guaiFENesin-dextromethorphan (ROBITUSSIN DM) 100-10 MG/5ML syrup 10 mL  10 mL Oral Q4H PRN Fuller Plan A, MD       heparin injection 5,000 Units  5,000 Units Subcutaneous Q8H Smith, Rondell A, MD   5,000 Units at 06/08/22 1427   hydrALAZINE (APRESOLINE) injection 10 mg  10 mg Intravenous Q4H PRN Smith, Rondell A, MD       lidocaine (LIDODERM) 5 % 1 patch  1 patch Transdermal Q24H Smith, Rondell A, MD   1 patch at 06/07/22 1548   magnesium oxide (MAG-OX) tablet 400 mg  400 mg Oral BID Pokhrel, Laxman, MD   400 mg at 06/08/22 1123   morphine (PF) 4 MG/ML injection 4 mg  4 mg Intravenous Q2H PRN Fuller Plan A, MD   4 mg at 06/08/22 1427   Oral care mouth rinse  15 mL Mouth Rinse PRN Pokhrel, Laxman, MD       polyethylene glycol (MIRALAX / GLYCOLAX) packet 17 g  17 g Oral Daily Pokhrel, Laxman, MD   17 g at 06/07/22 0959   promethazine (PHENERGAN) 12.5 mg in sodium chloride 0.9 % 50 mL IVPB  12.5 mg Intravenous Q6H PRN [place] Julian, Rondell A, MD 200 mL/hr at 06/06/22 1026 12.5 mg at 06/06/22 1026   senna-docusate (Senokot-S) tablet 1 tablet  1 tablet Oral BID Fuller Plan A, MD   1 tablet at 06/08/22 1125   sodium chloride flush (NS) 0.9 % injection 3 mL  3 mL Intravenous Q12H Smith, Rondell A, MD   3 mL at 06/08/22 1129   zinc sulfate capsule 220 mg  220 mg Oral Daily Smith, Rondell A, MD   220 mg at 06/08/22 1123   zolpidem (AMBIEN) tablet 5 mg  5 mg Oral QHS [place] Eric J, DO         Discharge Medications: Please see discharge summary for a list of discharge  medications.  Relevant Imaging Results:  Relevant Lab Results:   Additional Information SSN: [id number]; COVID+ off isolation on 06/14/2022  Dane Kopke F Cathlyn Tersigni, LCSWA

## 2022-06-08 NOTE — Progress Notes (Signed)
PT Cancellation Note  Patient Details Name: Ronald Hampton MRN: [id number] DOB: 1981-05-22   Cancelled Treatment:    Reason Eval/Treat Not Completed: Other (comment) Eating lunch; will reattempt close to [place], PT  Acute Rehabilitation Services Office [place] 06/08/2022, 11:59 AM

## 2022-06-08 NOTE — Progress Notes (Signed)
PROGRESS NOTE    Ronald Hampton  XNT:[id number] DOB: [birth year] DOA: 06/02/2022 PCP: Patient, No Pcp Per    Brief Narrative:   Ronald Hampton is a 41 y.o. male with past medical history significant for DM2, chronic hepatitis C,  prior back surgeries, bipolar disorder, PTSD, testicular cancer, polysubstance abuse including prior IV drug use (methamphetamine and heroin presented to hospital after sustaining a fall out of the bed.    Recent hospitalization at [facility]  from 5/19-11/3 when patient had attempted suicide by heroin overdose which he relates to son dying of SIDS.  CT imaging showed acute cerebellar ICH with IVH.  Patient was found to be septic and intubated for airway protection.  MRI of the brain had noted small subacute infarcts in the bilateral cerebellar hemispheres and had septic emboli as he was found to have MRSA bacteremia.  No vegetations were noted on TEE although initial heart function was reduced 45-50%, but repeat echo 12/2021 noted EF to be 60-65%.  Patient had been put on broad-spectrum antibiotics and evaluated by ID.  Found to have C6/7, C7/T1, L2/L3, and L5/S1 discitis and osteomyelitis as well as paraspinal abscess.  He had completed IV antibiotics and repeat imaging noted improvement in discitis and osteomyelitis with no evidence of abscess on 04/01/22.  Infectious disease had recommended him to continue on doxycycline 100 mg p.o. twice daily for suppression but was not taking it.  For drug abuse patient had been put on methadone, but he appears not to have been on this medication.  During hospitalization, patient had developed critical illness myopathy and had been unable to walk.  During that hospital stay patient was noted to have hyperkalemia and SPEP  showed M-spike. Immunofixation showed serum immunofixation IgG Kappa spike.  Patient underwent bone marrow biopsy on 05/10/2022 and was positive for possible multiple myeloma.  He was  scheduled to follow-up with oncology in the outpatient setting.  His creatinine was around 2.0 at discharge.  After he was discharged from the hospital he went to skilled nursing facility since he was unable to take care of himself. Subsequently, patient had left the facility and went to live with his Aunt and was not taking any of his medications.  He had had a fall 3 days prior to presentation and started having fevers.    In the ED, patient was noted to be tachycardic, tachypneic with elevated blood pressure.  Labs showed mild hypokalemia with creatinine elevated at 2.3, calcium 11.3, CK 291, and lactic acid within normal limits.  CT scan of the head and cervical spine noted no acute abnormality with fusion at C6-T1.  CT scan of the abdomen pelvis noted large amount of stool throughout the colon, stable splenomegaly, and and chronic appearing fractures at L1/L2/ L4.  CT of the left knee noted anterior soft tissue swelling without acute fracture or dislocation and advanced osteoarthritis.  Blood cultures were obtained.  Patient had been bolused 2 L of normal saline IV fluid, potassium chloride 10 mEq IV, Phenergan, and morphine for pain.  Patient also tested positive for COVID-19 infection.  Patient was then admitted hospital for further evaluation and treatment.  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  CK  291.  --Continue gabapentin  --PT/OT recommend SNF placement, TOC for assistance --Continue therapy efforts while inpatient   SIRS Mildly febrile tachycardic tachypneic on presentation.  Patient with  history of MRSA bacteremia, ESBL E.  Klebsiella, and osteomyelitis/discitis.  Patient was supposed to be on doxycycline suppression but was not taking it.   --Blood cultures: No growth x 5 days --Urine culture less than 10,000 colonies of insignificant growth. --Doxycycline 100 mg p.o. twice daily    COVID-19 viral infection Incidental finding with fever malaise.  No hypoxia.  Fever is now resolved.  Completed 5-day course of Molnupiravir. --Supportive care --Continue airborne/contact isolation for 10 days from date of diagnosis (11/19)   Hypercalcemia/monoclonal gammopathy Calcium 11.3 on admission, has improved with IV fluids.  Patient has been seen by oncology as outpatient who has an impression of monoclonal gammopathy versus the possibility of POEMS.  Plan was EMG studies.  Patient follows up with hematology at [facility].  Continue outpatient follow-up   Mild acute kidney injury on chronic kidney disease stage stage IIIb Presenting creatinine at 2.3.  Baseline around 2.0..  Patient received IV hydration with improvement in creatinine level at 1.52 11/22.   Mild hypokalemia.   Repleted during hospitalization   Hypomagnesemia.  Repleted during hospitalization  History of osteomyelitis/discitis, MRSA bacteremia Continue doxycycline for chronic suppression.  Initial proposed end date was 07-17-22.   --Blood cultures x 2: No growth x 5 days --Continue doxycycline 100 mg p.o. every 12 hours   Constipation Noted on CT scan of the abdomen.   --Continue Senokot-S1 tablet p.o. twice daily --MiraLAX p.o. daily   Elevated LFTs/hyperbilirubinemia: Resolved AST 49, ALT 52, and total bilirubin 1.9.  LFTs and bilirubin now have normalized.   Normocytic anemia Hemoglobin stable, 10.8.   Depression --Continue Cymbalta   Chronic hepatitis C --Outpatient follow-up with infectious disease on discharge for consideration of treatment outpatient.   History of CVA history of ICH and IVH Continue aspirin   History of polysubstance abuse Patient with history of IV drug abuse including heroin and methamphetamines.  Urine drug screen positive for opiates and amphetamine at this time.  He was supposed to be on methadone not taking it.  Patient was counseled about it.   Stage I coccyx  pressure ulceration, POA Pressure Injury 06/03/22 Coccyx Mid Stage 1 -  Intact skin with non-blanchable redness of a localized area usually over a bony prominence. (Active)  06/03/22 1629  Location: Coccyx  Location Orientation: Mid  Staging: Stage 1 -  Intact skin with non-blanchable redness of a localized area usually over a bony prominence.  Wound Description (Comments):   Present on Admission: Yes  -- Wound RN consulted.  Continue local wound care, offloading  Insomnia -- Ambien 5 mg p.o. nightly   DVT prophylaxis: heparin injection 5,000 Units Start: 06/03/22 1400    Code Status: Full Code Family Communication: No family present at bedside this morning  Disposition Plan:  Level of care: Med-Surg Status is: Inpatient Remains inpatient appropriate because: Pending SNF placement    Consultants:  none  Procedures:  none  Antimicrobials:   Molnupiravir 11/19>>   Subjective: Patient seen examined bedside, resting comfortably.  Lying in bed watching TV.  Patient requesting resumption of Ambien for insomnia.  Otherwise no questions or concerns at this time.  Denies headache, no dizziness, no current fever, no chills/night sweats, no nausea/vomiting, no diarrhea, no chest pain, no palpitations, no abdominal pain, no cough/congestion.  No acute events overnight per nursing staff.  Medically stable for discharge to SNF once bed available, anticipate likely will need 10-day quarantine from initial diagnosis of COVID-19 viral infection.  Objective: Vitals:   06/07/22  1553 06/07/22 2021 06/08/22 0449 06/08/22 1149  BP: (!) 134/97 (!) 118/94 114/76 (!) 125/92  Pulse: (!) 103 96 77 92  Resp: _0 Temp: 99.3 F (37.4 C) 98.8 F (37.1 C) 98.4 F (36.9 C) 98.1 F (36.7 C)  TempSrc: Oral Oral Oral Oral  SpO2: 99% 99% 100% 98%  Weight:      Height:        Intake/Output Summary (Last 24 hours) at 06/08/2022 1202 Last data filed at 06/08/2022 1129 Gross per 24 hour   Intake 1123 ml  Output 650 ml  Net 473 ml   Filed Weights   06/03/22 1621 06/04/22 2038 06/05/22 2037  Weight: 86.3 kg 88.6 kg 86.9 kg    Examination:  Physical Exam: GEN: NAD, alert and oriented x 3, chronically ill appearance, appears older than stated age 82: NCAT, PERRL, EOMI, sclera clear, MMM, poor dentition PULM: CTAB w/o wheezes/crackles, normal respiratory effort, on room air CV: RRR w/o M/G/R GI: abd soft, NTND, NABS, no R/G/M MSK: no peripheral edema, moves all extremities independently; decreased muscle tone NEURO: CN II-XII intact, no focal deficits PSYCH: normal mood/affect Integumentary: dry/intact, no rashes or wounds    Data Reviewed: I have personally reviewed following labs and imaging studies  CBC: Recent Labs  Lab 06/03/22 0014 06/04/22 0725 06/05/22 0519 06/06/22 0422  WBC 10.4 6.6 6.3 7.0  NEUTROABS 7.3  --   --   --   HGB 12.5* 9.5* 10.1* 10.8*  HCT 36.0* 26.7* 27.9* 29.8*  MCV 84.3 82.9 82.5 82.8  PLT 153 119* 122* 812   Basic Metabolic Panel: Recent Labs  Lab 06/03/22 0014 06/04/22 0725 06/05/22 0519 06/06/22 0422  NA 138 137 133* 134*  K 3.1* 3.4* 3.5 3.7  CL 97* 110 101 101  CO2 24 19* 23 23  GLUCOSE 82 89 92 90  BUN 43* 26* 19 21*  CREATININE 2.35* 1.90* 1.59* 1.52*  CALCIUM 11.3* 8.8* 9.0 9.2  MG  --   --  1.4* 1.9   GFR: Estimated Creatinine Clearance: 70.5 mL/min (A) (by C-G formula based on SCr of 1.52 mg/dL (H)). Liver Function Tests: Recent Labs  Lab 06/03/22 0014 06/04/22 0725  AST 49* 27  ALT 52* 31  ALKPHOS 53 42  BILITOT 1.9* 0.7  PROT 8.7* 6.1*  ALBUMIN 3.9 2.7*   Recent Labs  Lab 06/03/22 0014  LIPASE 25   No results for input(s): "AMMONIA" in the last 168 hours. Coagulation Profile: No results for input(s): "INR", "PROTIME" in the last 168 hours. Cardiac Enzymes: Recent Labs  Lab 06/03/22 0014  CKTOTAL 291   BNP (last 3 results) No results for input(s): "PROBNP" in the last 8760  hours. HbA1C: No results for input(s): "HGBA1C" in the last 72 hours. CBG: No results for input(s): "GLUCAP" in the last 168 hours. Lipid Profile: No results for input(s): "CHOL", "HDL", "LDLCALC", "TRIG", "CHOLHDL", "LDLDIRECT" in the last 72 hours. Thyroid Function Tests: No results for input(s): "TSH", "T4TOTAL", "FREET4", "T3FREE", "THYROIDAB" in the last 72 hours. Anemia Panel: No results for input(s): "VITAMINB12", "FOLATE", "FERRITIN", "TIBC", "IRON", "RETICCTPCT" in the last 72 hours. Sepsis Labs: Recent Labs  Lab 06/03/22 0014 06/03/22 0456  LATICACIDVEN 1.6 1.0    Recent Results (from the past 240 hour(s))  Culture, blood (routine x 2)     Status: None   Collection Time: 06/03/22 12:23 AM   Specimen: BLOOD RIGHT ARM  Result Value Ref Range Status   Specimen Description BLOOD  RIGHT ARM  Final   Special Requests   Final    BOTTLES DRAWN AEROBIC AND ANAEROBIC Blood Culture results may not be optimal due to an excessive volume of blood received in culture bottles   Culture   Final    NO GROWTH 5 DAYS Performed at [facility], [place] [address] [place], [place] [address]    Report Status 06/08/2022 FINAL  Final  Culture, blood (routine x 2)     Status: None   Collection Time: 06/03/22  4:56 AM   Specimen: BLOOD RIGHT HAND  Result Value Ref Range Status   Specimen Description BLOOD RIGHT HAND  Final   Special Requests   Final    BOTTLES DRAWN AEROBIC ONLY Blood Culture adequate volume   Culture   Final    NO GROWTH 5 DAYS Performed at [facility], [place] [address] [place], [place] [address]    Report Status 06/08/2022 FINAL  Final  Resp Panel by RT-PCR (Flu A&B, Covid) Anterior Nasal Swab     Status: Abnormal   Collection Time: 06/03/22 12:44 PM   Specimen: Anterior Nasal Swab  Result Value Ref Range Status   SARS Coronavirus 2 by RT PCR POSITIVE (A) NEGATIVE Final    Comment: (NOTE) SARS-CoV-2 target nucleic acids are DETECTED.  The SARS-CoV-2 RNA  is generally detectable in upper respiratory specimens during the acute phase of infection. Positive results are indicative of the presence of the identified virus, but do not rule out bacterial infection or co-infection with other pathogens not detected by the test. Clinical correlation with patient history and other diagnostic information is necessary to determine patient infection status. The expected result is Negative.  Fact Sheet for Patients: EntrepreneurPulse.com.au  Fact Sheet for Healthcare Providers: IncredibleEmployment.be  This test is not yet approved or cleared by the [place] FDA and  has been authorized for detection and/or diagnosis of SARS-CoV-2 by FDA under an Emergency Use Authorization (EUA).  This EUA will remain in effect (meaning this test can be used) for the duration of  the COVID-19 declaration under Section 564(b)(1) of the A ct, 21 U.S.C. section 360bbb-3(b)(1), unless the authorization is terminated or revoked sooner.     Influenza A by PCR NEGATIVE NEGATIVE Final   Influenza B by PCR NEGATIVE NEGATIVE Final    Comment: (NOTE) The Xpert Xpress SARS-CoV-2/FLU/RSV plus assay is intended as an aid in the diagnosis of influenza from Nasopharyngeal swab specimens and should not be used as a sole basis for treatment. Nasal washings and aspirates are unacceptable for Xpert Xpress SARS-CoV-2/FLU/RSV testing.  Fact Sheet for Patients: EntrepreneurPulse.com.au  Fact Sheet for Healthcare Providers: IncredibleEmployment.be  This test is not yet approved or cleared by the [place] FDA and has been authorized for detection and/or diagnosis of SARS-CoV-2 by FDA under an Emergency Use Authorization (EUA). This EUA will remain in effect (meaning this test can be used) for the duration of the COVID-19 declaration under Section 564(b)(1) of the Act, 21 U.S.C. section 360bbb-3(b)(1),  unless the authorization is terminated or revoked.  Performed at [facility], [place] [address] [place], [place] [address]   Urine Culture     Status: Abnormal   Collection Time: 06/03/22  1:26 PM   Specimen: Urine, Catheterized  Result Value Ref Range Status   Specimen Description URINE, CATHETERIZED  Final   Special Requests NONE  Final   Culture (A)  Final    <10,000 COLONIES/mL INSIGNIFICANT GROWTH Performed at  [facility], [place] [address], [place], [place] [address]    Report Status 06/04/2022 FINAL  Final         Radiology Studies: No results found.      Scheduled Meds:  vitamin C  500 mg Oral Daily   aspirin EC  81 mg Oral Daily   doxycycline  100 mg Oral Q12H   DULoxetine  40 mg Oral BID   gabapentin  300 mg Oral BID   heparin  5,000 Units Subcutaneous Q8H   lidocaine  1 patch Transdermal Q24H   magnesium oxide  400 mg Oral BID   polyethylene glycol  17 g Oral Daily   senna-docusate  1 tablet Oral BID   sodium chloride flush  3 mL Intravenous Q12H   zinc sulfate  220 mg Oral Daily   zolpidem  5 mg Oral QHS   Continuous Infusions:  promethazine (PHENERGAN) injection (IM or IVPB) 12.5 mg (06/06/22 1026)     LOS: 5 days    Time spent: 49 minutes spent on chart review, discussion with nursing staff, consultants, updating family and interview/physical exam; more than 50% of that time was spent in counseling and/or coordination of care.    Khaalid Lefkowitz J [place] DO [facility] Available via Epic secure chat 7am-7pm After these hours, please refer to coverage provider listed on [email or web address] 06/08/2022, 12:02 PM

## 2022-06-08 NOTE — Progress Notes (Signed)
OT Cancellation Note  Patient Details Name: Jencarlo Bonadonna III MRN: [id number] DOB: 12-May-1981   Cancelled Treatment:    Reason Eval/Treat Not Completed: Other (comment).  Currently eating, will check back closer to 3pm.  Sherry Rogus D Kmarion Rawl 06/08/2022, 11:51 AM 06/08/2022  RP, OTR/L  Acute Rehabilitation Services  Office:  [phone]

## 2022-06-08 NOTE — TOC Initial Note (Addendum)
Transition of Care [facility]) - Initial/Assessment Note    Patient Details  Name: Ronald Hampton MRN: [id number] Date of Birth: [birth year]  Transition of Care [facility]) CM/SW Contact:    Milinda Antis, [place] Phone Number: 06/08/2022, 2:33 PM  Clinical Narrative:                 CSW received consult for possible SNF placement at time of discharge. CSW spoke with patient.  Phone number is [phone].   Patient expressed understanding of PT recommendation and is agreeable to SNF placement at time of discharge. Patient reports preference for a facility anywhere except [facility] CSW discussed insurance authorization process and Medicare SNF ratings list will be provided. CSW will send out referrals for review and provide bed offers as available.   Skilled Nursing Rehab Facilities-   [email or web address]   Ratings out of 5 stars (5 the highest)   Name Address  Phone # [place] Inspection Overall  [facility] [address] [place] '4 5 2 3  '$ [facility]  [address], [place] [phone] '4 2 5 5  '$ [facility] [place], [place] '1 3 1 1  '$ [facility] [place], [place] '2 2 4 4  '$ [facility] [address] [place] '2 1 2 1  '$ [facility] [place] '3 3 4 4  '$ [facility] [address] [place] '4 1 3 2  '$ [facility] [address] [place] '4 1 3 2  '$ [address] ([facility]) [place], [place] [phone] '3 1 2 1  '$ [facility] [phone], [place] [phone] '3 1 1 1  '$ [facility] [address] [facility] [phone] '3 2 2 2  '$ [facility] ([place]) [place]. [place], [place] [phone] '3 1 1 1  '$ Dustin Flock [address] [place] [phone] '4 2 4 4          '$ [facility]      [facility] [place] '4 1 3 2  '$ [facility] [place] [address], [place] '3 1 5 4  '$ [place], [place] [phone] '1 1 2 1  '$ [facility] [address] [employer] [phone] '2 2 4 4          '$ [address] (no [facility]) [place], [place] [phone] '5 5 5 5  '$ [facility] (No Hum[employer]) [address], [place] '4 1 4 3  '$ [facility] (No [employer]HC) [place], [place] [phone] '5 5 5 5  '$ [facility] [address] [employer] [phone] '2 3 5 5  '$ [facility] [address] [place] '1 1 2 1  '$ [facility] [address], [place] [phone] '3 1 1 1  '$ [place], [place] '5 2 5 5  '$ [facility]  [address], [place] '2 2 1 1  '$ [facility] [address], [place] '3 2 1 1  '$ [facility] [place], [place] '2 2 2 2          '$ [facility] [address], [place] '1 1 1 1  '$ [facility] [address], [place]  [phone] '2 4 3 3  '$ [facility] [place] [address], [place] [phone] 3 2  [place], [place] '2 1 1 1  '$ [place] (No Humana) [address], [place] [phone] '2 2 3 3  '$ [facility]) [address], [place] [phone] '2 1 1 1          '$ [facility], [place] '5 4 5 5  '$ [facility])  [address], [place] '2 1 2 1  '$ [facility]) [place] [address] [place] '3 1 4 3  '$ [facility] [address] [place] '3 3 4 4  '$ [address] [place], [place] '2 3 1 1  '$ [facility]) [place], [place] '2 1 4 3      '$ Expected Discharge Plan: Skilled Nursing Facility Barriers to Discharge: Continued Medical Work up, Ship broker, SNF Pending bed offer   Patient Goals and CMS Choice        Expected Discharge Plan and Services Expected Discharge Plan: [place]       Living arrangements for the past 2 months: Apartment                                      Prior Living Arrangements/Services Living arrangements for the past 2 months: Apartment Lives with:: Self Patient language and need for interpreter reviewed:: Yes        Need for Family Participation in Patient Care: Yes (Comment) Care giver support system in place?: No (comment)   Criminal Activity/Legal Involvement Pertinent to Current Situation/Hospitalization: No - Comment as needed  Activities of Daily Living   ADL Screening (condition at time of admission) Patient's cognitive ability adequate to safely complete daily activities?: Yes Is the patient deaf or have difficulty hearing?: No Does the patient have difficulty seeing, even when wearing glasses/contacts?: No Does the patient have difficulty concentrating, remembering, or making decisions?: No Patient able to express need for assistance with ADLs?: Yes Does the patient have difficulty dressing or bathing?: Yes Independently performs ADLs?: No Communication: Independent Dressing (OT): Needs assistance Is this a change from baseline?: Pre-admission baseline Grooming: Needs assistance Is this a change from baseline?: Pre-admission baseline Feeding: Independent Bathing: Needs assistance Is this a change from baseline?: Pre-admission baseline Toileting: Needs assistance Is this a change from baseline?: Pre-admission baseline In/Out Bed: Needs assistance Is this a change from baseline?: Pre-admission baseline Walks in Home: Dependent Is this a change from baseline?: Pre-admission baseline Weakness of Legs:  Both Weakness of Arms/Hands: Both  Permission Sought/Granted   Permission granted to share information with : Yes, Verbal Permission Granted     Permission granted to share info w AGENCY: SNF        Emotional Assessment       Orientation: : Oriented to Situation, Oriented to  Time, Oriented to Place, Oriented to Self Alcohol / Substance Use: Illicit Drugs Psych Involvement: No (comment)  Admission diagnosis:  Dehydration [E86.0] Fall [W19.XXXA] AKI (acute kidney injury) ([place]) [N17.9] Fall, initial encounter [W19.XXXA] Fever, unspecified fever cause [R50.9] Patient Active Problem List   Diagnosis Date Noted   Pressure injury of skin 06/04/2022   Fall 06/03/2022   Acute kidney injury superimposed on chronic kidney disease ([place]) 06/03/2022   SIRS (systemic inflammatory response syndrome) ([place]) 06/03/2022   COVID-19 virus infection 06/03/2022  Hypercalcemia 06/03/2022   Monoclonal gammopathy 06/03/2022   Constipation 06/03/2022   History of discitis 06/03/2022   History of bacteremia 06/03/2022   History of osteomyelitis 06/03/2022   Transaminitis 06/03/2022   Hyperbilirubinemia 06/03/2022   Normocytic anemia 06/03/2022   Depression 06/03/2022   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   Chronic hepatitis C ([place]) 06/03/2022   History of CVA (cerebrovascular accident) 06/03/2022   Polysubstance abuse ([place]) 06/03/2022   MDD (major depressive disorder), severe ([place]) 11/10/2017   DM (diabetes mellitus), type 2, uncontrolled 10/23/2017   HTN (hypertension) 10/23/2017   OCD (obsessive compulsive disorder) 10/23/2017   Bipolar I disorder, current or most recent episode depressed, with psychotic features ([place]) 10/22/2017   PTSD (post-traumatic stress disorder) 10/22/2017   Opioid use disorder, moderate, dependence ([employer]) 10/22/2017   Suicidal ideation 10/22/2017   PCP:  Patient, No Pcp Per Pharmacy:   [place] [address] [place] [address] Phone: [phone] Fax: [phone]     Social Determinants of Health (SDOH) Interventions    Readmission Risk Interventions     No data to display

## 2022-06-08 NOTE — Plan of Care (Signed)
  Problem: Respiratory: Goal: Will maintain a patent airway Outcome: Progressing Goal: Complications related to the disease process, condition or treatment will be avoided or minimized Outcome: Progressing   

## 2022-06-08 NOTE — Progress Notes (Signed)
Occupational Therapy Treatment Patient Details Name: Ronald Hampton MRN: [id number] DOB: 1981/04/01 Today's Date: 06/08/2022   History of present illness Pt is a 41 y/o male who presented after a fall out of bed at home. Pt admitted for SIRS, incidentally COVID+, and possible AKI. Pt with recent hospitalization at [facility] (May-Oct 2023) after attempted suicide by overdose resulting in multiple complications including cerebellar ICH, intubation, small cerebellar infarcts, and lumbar discitis and osteomyelitis as well as paraspinal abscess w/ IV antibiotics. PMH: bipolar 1 disorder, depression, PTSD, functional paraplegia   OT comments  Patient with good progress toward patient focused goals.  Patient willing to progress to sitting edge of bed with supervision, lateral scoot with Min Guard, and eventually stood at RW level with Mod A of 2.  OT to follow in the acute setting, PT asked if patient's Aunt could bring in his wheelchair.  SNF level rehab to maximize his functional independence at wheelchair level in a handicapped apartment.     Recommendations for follow up therapy are one component of a multi-disciplinary discharge planning process, led by the attending physician.  Recommendations may be updated based on patient status, additional functional criteria and insurance authorization.    Follow Up Recommendations  Skilled nursing-short term rehab (<3 hours/day)     Assistance Recommended at Discharge Frequent or constant Supervision/Assistance  Patient can return home with the following  A little help with walking and/or transfers;A lot of help with bathing/dressing/bathroom;Assistance with cooking/housework;Assist for transportation;Help with stairs or ramp for entrance   Equipment Recommendations  Other (comment) (TBD)    Recommendations for Other Services      Precautions / Restrictions Precautions Precautions: Fall Precaution Comments: Covid Restrictions Weight  Bearing Restrictions: No       Mobility Bed Mobility Overal bed mobility: Needs Assistance Bed Mobility: Supine to Sit     Supine to sit: Supervision, Min guard          Transfers Overall transfer level: Needs assistance Equipment used: Rolling walker (2 wheels) Transfers: Sit to/from Stand, Bed to chair/wheelchair/BSC Sit to Stand: Mod assist, +2 physical assistance          Lateral/Scoot Transfers: Min guard       Balance Overall balance assessment: Needs assistance Sitting-balance support: Feet supported, Bilateral upper extremity supported Sitting balance-Leahy Scale: Fair     Standing balance support: Bilateral upper extremity supported Standing balance-Leahy Scale: Poor                             ADL either performed or assessed with clinical judgement   ADL Overall ADL's : Needs assistance/impaired Eating/Feeding: Independent;Bed level   Grooming: Set up;Sitting   Upper Body Bathing: Set up;Sitting   Lower Body Bathing: Moderate assistance;Sitting/lateral leans;Bed level   Upper Body Dressing : Set up;Sitting   Lower Body Dressing: Sitting/lateral leans;Bed level;Maximal assistance                        Cognition Arousal/Alertness: Awake/alert Behavior During Therapy: WFL for tasks assessed/performed, Flat affect Overall Cognitive Status: Within Functional Limits for tasks assessed                                                       General Comments  VSS on RA    Pertinent Vitals/ Pain       Pain Assessment Pain Assessment: Faces Faces Pain Scale: Hurts little more Pain Location: back Pain Descriptors / Indicators: Aching Pain Intervention(s): Monitored during session, Patient requesting pain meds-RN notified                                                          Frequency  Min 2X/week        Progress Toward Goals  OT Goals(current goals can now be  found in the care plan section)  Progress towards OT goals: Progressing toward goals  Acute Rehab OT Goals OT Goal Formulation: With patient Time For Goal Achievement: 06/18/22 Potential to Achieve Goals: Good  Plan Discharge plan remains appropriate    Co-evaluation                 AM-PAC OT "6 Clicks" Daily Activity     Outcome Measure   Help from another person eating meals?: None Help from another person taking care of personal grooming?: A Little Help from another person toileting, which includes using toliet, bedpan, or urinal?: A Lot Help from another person bathing (including washing, rinsing, drying)?: A Lot Help from another person to put on and taking off regular upper body clothing?: None Help from another person to put on and taking off regular lower body clothing?: A Lot 6 Click Score: 17    End of Session Equipment Utilized During Treatment: Rolling walker (2 wheels)  OT Visit Diagnosis: Other abnormalities of gait and mobility (R26.89);Muscle weakness (generalized) (M62.81)   Activity Tolerance Patient tolerated treatment well   Patient Left in chair;with call bell/phone within reach;with chair alarm set   Nurse Communication Mobility status        Time: 1315-1340 OT Time Calculation (min): 25 min  Charges: OT General Charges $OT Visit: 1 Visit OT Treatments $Self Care/Home Management : 8-22 mins  06/08/2022  RP, OTR/L  Acute Rehabilitation Services  Office:  [phone]   Metta Clines 06/08/2022, 4:31 PM

## 2022-06-09 DIAGNOSIS — U071 COVID-19: Secondary | ICD-10-CM | POA: Diagnosis not present

## 2022-06-09 MED ORDER — OXYCODONE HCL 5 MG PO TABS
5.0000 mg | ORAL_TABLET | ORAL | Status: DC | PRN
  Administered 2022-06-09 – 2022-06-13 (×16): 10 mg via ORAL
  Filled 2022-06-09 (×19): qty 2

## 2022-06-09 MED ORDER — MORPHINE SULFATE (PF) 4 MG/ML IV SOLN
4.0000 mg | INTRAVENOUS | Status: DC | PRN
  Administered 2022-06-09 – 2022-06-13 (×4): 4 mg via INTRAVENOUS
  Filled 2022-06-09 (×5): qty 1

## 2022-06-09 NOTE — Progress Notes (Signed)
PROGRESS NOTE    Ronald Hampton  UKG:[id number] DOB: 03-23-1981 DOA: 06/02/2022 PCP: Patient, No Pcp Per    Brief Narrative:   Ronald Hampton is a 41 y.o. male with past medical history significant for DM2, chronic hepatitis C,  prior back surgeries, bipolar disorder, PTSD, testicular cancer, polysubstance abuse including prior IV drug use (methamphetamine and heroin presented to hospital after sustaining a fall out of the bed.    Recent hospitalization at [facility]  from 5/19-11/3 when patient had attempted suicide by heroin overdose which he relates to son dying of SIDS.  CT imaging showed acute cerebellar ICH with IVH.  Patient was found to be septic and intubated for airway protection.  MRI of the brain had noted small subacute infarcts in the bilateral cerebellar hemispheres and had septic emboli as he was found to have MRSA bacteremia.  No vegetations were noted on TEE although initial heart function was reduced 45-50%, but repeat echo 12/2021 noted EF to be 60-65%.  Patient had been put on broad-spectrum antibiotics and evaluated by ID.  Found to have C6/7, C7/T1, L2/L3, and L5/S1 discitis and osteomyelitis as well as paraspinal abscess.  He had completed IV antibiotics and repeat imaging noted improvement in discitis and osteomyelitis with no evidence of abscess on 04/01/22.  Infectious disease had recommended him to continue on doxycycline 100 mg p.o. twice daily for suppression but was not taking it.  For drug abuse patient had been put on methadone, but he appears not to have been on this medication.  During hospitalization, patient had developed critical illness myopathy and had been unable to walk.  During that hospital stay patient was noted to have hyperkalemia and SPEP  showed M-spike. Immunofixation showed serum immunofixation IgG Kappa spike.  Patient underwent bone marrow biopsy on 05/10/2022 and was positive for possible multiple myeloma.  He was scheduled to  follow-up with oncology in the outpatient setting.  His creatinine was around 2.0 at discharge.  After he was discharged from the hospital he went to skilled nursing facility since he was unable to take care of himself. Subsequently, patient had left the facility and went to live with his Aunt and was not taking any of his medications.  He had had a fall 3 days prior to presentation and started having fevers.    In the ED, patient was noted to be tachycardic, tachypneic with elevated blood pressure.  Labs showed mild hypokalemia with creatinine elevated at 2.3, calcium 11.3, CK 291, and lactic acid within normal limits.  CT scan of the head and cervical spine noted no acute abnormality with fusion at C6-T1.  CT scan of the abdomen pelvis noted large amount of stool throughout the colon, stable splenomegaly, and and chronic appearing fractures at L1/L2/ L4.  CT of the left knee noted anterior soft tissue swelling without acute fracture or dislocation and advanced osteoarthritis.  Blood cultures were obtained.  Patient had been bolused 2 L of normal saline IV fluid, potassium chloride 10 mEq IV, Phenergan, and morphine for pain.  Patient also tested positive for COVID-19 infection.  Patient was then admitted hospital for further evaluation and treatment.  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  CK  291.  --Continue gabapentin  --PT/OT recommend SNF placement, TOC for assistance --Continue therapy efforts while inpatient   SIRS Mildly febrile tachycardic tachypneic on presentation.  Patient with history  of MRSA bacteremia, ESBL E.  Klebsiella, and osteomyelitis/discitis.  Patient was supposed to be on doxycycline suppression but was not taking it.   --Blood cultures: No growth x 5 days --Urine culture less than 10,000 colonies of insignificant growth. --Continue Doxycycline 100 mg p.o. twice daily    COVID-19 viral infection Incidental finding with fever malaise.  No hypoxia.  Fever is now resolved.  Completed 5-day course of Molnupiravir. --Supportive care --Continue airborne/contact isolation for 10 days from date of diagnosis (11/19)   Hypercalcemia/monoclonal gammopathy Calcium 11.3 on admission, has improved with IV fluids.  Patient has been seen by oncology as outpatient who has an impression of monoclonal gammopathy versus the possibility of POEMS.  Plan was EMG studies.  Patient follows up with [facility] at [facility].  Continue outpatient follow-up   Mild acute kidney injury on chronic kidney disease stage stage IIIb Presenting creatinine at 2.3.  Baseline around 2.0..  Patient received IV hydration with improvement in creatinine level at 1.52 11/22.   Mild hypokalemia.   Repleted during hospitalization   Hypomagnesemia.  Repleted during hospitalization  History of osteomyelitis/discitis, MRSA bacteremia Continue doxycycline for chronic suppression.  Initial proposed end date was 07-17-22.   --Blood cultures x 2: No growth x 5 days --Continue doxycycline 100 mg p.o. every 12 hours   Constipation Noted on CT scan of the abdomen.   --Continue Senokot-S1 tablet p.o. twice daily --MiraLAX p.o. daily   Elevated LFTs/hyperbilirubinemia: Resolved AST 49, ALT 52, and total bilirubin 1.9.  LFTs and bilirubin now have normalized.   Normocytic anemia Hemoglobin stable, 10.8.   Depression --Continue Cymbalta   Chronic hepatitis C --Outpatient follow-up with infectious disease on discharge for consideration of treatment outpatient.   History of CVA history of ICH and IVH Continue aspirin   History of polysubstance abuse Patient with history of IV drug abuse including heroin and methamphetamines.  Urine drug screen positive for opiates and amphetamine at this time.  He was supposed to be on methadone not taking it.  Patient was counseled about it.   Stage I coccyx  pressure ulceration, POA Pressure Injury 06/03/22 Coccyx Mid Stage 1 -  Intact skin with non-blanchable redness of a localized area usually over a bony prominence. (Active)  06/03/22 1629  Location: Coccyx  Location Orientation: Mid  Staging: Stage 1 -  Intact skin with non-blanchable redness of a localized area usually over a bony prominence.  Wound Description (Comments):   Present on Admission: Yes  -- Wound RN consulted.  Continue local wound care, offloading  Insomnia -- Ambien 5 mg p.o. nightly   DVT prophylaxis: heparin injection 5,000 Units Start: 06/03/22 1400    Code Status: Full Code Family Communication: No family present at bedside this morning  Disposition Plan:  Level of care: Med-Surg Status is: Inpatient Remains inpatient appropriate because: Pending SNF placement    Consultants:  none  Procedures:  none  Antimicrobials:   Molnupiravir 11/19>>   Subjective: Patient seen examined bedside, resting comfortably.  Lying in bed watching TV.  No complaints or concerns this morning.  Denies headache, no dizziness, no current fever, no chills/night sweats, no nausea/vomiting, no diarrhea, no chest pain, no palpitations, no abdominal pain, no cough/congestion.  No acute events overnight per nursing staff.  Medically stable for discharge to SNF once bed available, anticipate likely will need 10-day quarantine from initial diagnosis of COVID-19 viral infection.  Objective: Vitals:   06/08/22 1149 06/08/22 1721 06/08/22 2018 06/09/22 0436  BP: Marland Kitchen)  125/92 (!) 127/94 112/86 124/84  Pulse: 92 99 98 95  Resp: _0 Temp: 98.1 F (36.7 C) 98.3 F (36.8 C) 98.9 F (37.2 C) 98.4 F (36.9 C)  TempSrc: Oral  Oral Oral  SpO2: 98% 98% 98% 100%  Weight:      Height:        Intake/Output Summary (Last 24 hours) at 06/09/2022 1324 Last data filed at 06/09/2022 0348 Gross per 24 hour  Intake 460 ml  Output 1850 ml  Net -1390 ml   Filed Weights   06/03/22  1621 06/04/22 2038 06/05/22 2037  Weight: 86.3 kg 88.6 kg 86.9 kg    Examination:  Physical Exam: GEN: NAD, alert and oriented x 3, chronically ill appearance, appears older than stated age HEENT: NCAT, PERRL, EOMI, sclera clear, MMM, poor dentition PULM: CTAB w/o wheezes/crackles, normal respiratory effort, on room air CV: RRR w/o M/G/R GI: abd soft, NTND, NABS, no R/G/M MSK: no peripheral edema, moves all extremities independently; decreased muscle tone NEURO: CN II-XII intact, no focal deficits PSYCH: normal mood/affect Integumentary: dry/intact, no rashes or wounds    Data Reviewed: I have personally reviewed following labs and imaging studies  CBC: Recent Labs  Lab 06/03/22 0014 06/04/22 0725 06/05/22 0519 06/06/22 0422  WBC 10.4 6.6 6.3 7.0  NEUTROABS 7.3  --   --   --   HGB 12.5* 9.5* 10.1* 10.8*  HCT 36.0* 26.7* 27.9* 29.8*  MCV 84.3 82.9 82.5 82.8  PLT 153 119* 122* 676   Basic Metabolic Panel: Recent Labs  Lab 06/03/22 0014 06/04/22 0725 06/05/22 0519 06/06/22 0422  NA 138 137 133* 134*  K 3.1* 3.4* 3.5 3.7  CL 97* 110 101 101  CO2 24 19* 23 23  GLUCOSE 82 89 92 90  BUN 43* 26* 19 21*  CREATININE 2.35* 1.90* 1.59* 1.52*  CALCIUM 11.3* 8.8* 9.0 9.2  MG  --   --  1.4* 1.9   GFR: Estimated Creatinine Clearance: 70.5 mL/min (A) (by C-G formula based on SCr of 1.52 mg/dL (H)). Liver Function Tests: Recent Labs  Lab 06/03/22 0014 06/04/22 0725  AST 49* 27  ALT 52* 31  ALKPHOS 53 42  BILITOT 1.9* 0.7  PROT 8.7* 6.1*  ALBUMIN 3.9 2.7*   Recent Labs  Lab 06/03/22 0014  LIPASE 25   No results for input(s): "AMMONIA" in the last 168 hours. Coagulation Profile: No results for input(s): "INR", "PROTIME" in the last 168 hours. Cardiac Enzymes: Recent Labs  Lab 06/03/22 0014  CKTOTAL 291   BNP (last 3 results) No results for input(s): "PROBNP" in the last 8760 hours. HbA1C: No results for input(s): "HGBA1C" in the last 72 hours. CBG: No  results for input(s): "GLUCAP" in the last 168 hours. Lipid Profile: No results for input(s): "CHOL", "HDL", "LDLCALC", "TRIG", "CHOLHDL", "LDLDIRECT" in the last 72 hours. Thyroid Function Tests: No results for input(s): "TSH", "T4TOTAL", "FREET4", "T3FREE", "THYROIDAB" in the last 72 hours. Anemia Panel: No results for input(s): "VITAMINB12", "FOLATE", "FERRITIN", "TIBC", "IRON", "RETICCTPCT" in the last 72 hours. Sepsis Labs: Recent Labs  Lab 06/03/22 0014 06/03/22 0456  LATICACIDVEN 1.6 1.0    Recent Results (from the past 240 hour(s))  Culture, blood (routine x 2)     Status: None   Collection Time: 06/03/22 12:23 AM   Specimen: BLOOD RIGHT ARM  Result Value Ref Range Status   Specimen Description BLOOD RIGHT ARM  Final   Special Requests   Final  BOTTLES DRAWN AEROBIC AND ANAEROBIC Blood Culture results may not be optimal due to an excessive volume of blood received in culture bottles   Culture   Final    NO GROWTH 5 DAYS Performed at [facility], [place] [address] [place], [place] [address]    Report Status 06/08/2022 FINAL  Final  Culture, blood (routine x 2)     Status: None   Collection Time: 06/03/22  4:56 AM   Specimen: BLOOD RIGHT HAND  Result Value Ref Range Status   Specimen Description BLOOD RIGHT HAND  Final   Special Requests   Final    BOTTLES DRAWN AEROBIC ONLY Blood Culture adequate volume   Culture   Final    NO GROWTH 5 DAYS Performed at [facility], [place] [address] [place], [place] [address]    Report Status 06/08/2022 FINAL  Final  Resp Panel by RT-PCR (Flu A&B, Covid) Anterior Nasal Swab     Status: Abnormal   Collection Time: 06/03/22 12:44 PM   Specimen: Anterior Nasal Swab  Result Value Ref Range Status   SARS Coronavirus 2 by RT PCR POSITIVE (A) NEGATIVE Final    Comment: (NOTE) SARS-CoV-2 target nucleic acids are DETECTED.  The SARS-CoV-2 RNA is generally detectable in upper respiratory specimens during the acute phase of  infection. Positive results are indicative of the presence of the identified virus, but do not rule out bacterial infection or co-infection with other pathogens not detected by the test. Clinical correlation with patient history and other diagnostic information is necessary to determine patient infection status. The expected result is Negative.  Fact Sheet for Patients: EntrepreneurPulse.com.au  Fact Sheet for Healthcare Providers: IncredibleEmployment.be  This test is not yet approved or cleared by the [place] FDA and  has been authorized for detection and/or diagnosis of SARS-CoV-2 by FDA under an Emergency Use Authorization (EUA).  This EUA will remain in effect (meaning this test can be used) for the duration of  the COVID-19 declaration under Section 564(b)(1) of the A ct, 21 U.S.C. section 360bbb-3(b)(1), unless the authorization is terminated or revoked sooner.     Influenza A by PCR NEGATIVE NEGATIVE Final   Influenza B by PCR NEGATIVE NEGATIVE Final    Comment: (NOTE) The Xpert Xpress SARS-CoV-2/FLU/RSV plus assay is intended as an aid in the diagnosis of influenza from Nasopharyngeal swab specimens and should not be used as a sole basis for treatment. Nasal washings and aspirates are unacceptable for Xpert Xpress SARS-CoV-2/FLU/RSV testing.  Fact Sheet for Patients: EntrepreneurPulse.com.au  Fact Sheet for Healthcare Providers: IncredibleEmployment.be  This test is not yet approved or cleared by the [place] FDA and has been authorized for detection and/or diagnosis of SARS-CoV-2 by FDA under an Emergency Use Authorization (EUA). This EUA will remain in effect (meaning this test can be used) for the duration of the COVID-19 declaration under Section 564(b)(1) of the Act, 21 U.S.C. section 360bbb-3(b)(1), unless the authorization is terminated or revoked.  Performed at [facility], [place] [address], [place], [place] [address]   Urine Culture     Status: Abnormal   Collection Time: 06/03/22  1:26 PM   Specimen: Urine, Catheterized  Result Value Ref Range Status   Specimen Description URINE, CATHETERIZED  Final   Special Requests NONE  Final   Culture (A)  Final    <10,000 COLONIES/mL INSIGNIFICANT GROWTH Performed at [facility], [place] [address], [place], [place] [address]  Report Status 06/04/2022 FINAL  Final         Radiology Studies: No results found.      Scheduled Meds:  vitamin C  500 mg Oral Daily   aspirin EC  81 mg Oral Daily   doxycycline  100 mg Oral Q12H   DULoxetine  40 mg Oral BID   gabapentin  300 mg Oral BID   heparin  5,000 Units Subcutaneous Q8H   lidocaine  1 patch Transdermal Q24H   magnesium oxide  400 mg Oral BID   polyethylene glycol  17 g Oral Daily   senna-docusate  1 tablet Oral BID   sodium chloride flush  3 mL Intravenous Q12H   zinc sulfate  220 mg Oral Daily   zolpidem  5 mg Oral QHS   Continuous Infusions:  promethazine (PHENERGAN) injection (IM or IVPB) 12.5 mg (06/08/22 2140)     LOS: 6 days    Time spent: 49 minutes spent on chart review, discussion with nursing staff, consultants, updating family and interview/physical exam; more than 50% of that time was spent in counseling and/or coordination of care.     J [place] DO [facility] Available via Epic secure chat 7am-7pm After these hours, please refer to coverage provider listed on [email or web address] 06/09/2022, 1:24 PM

## 2022-06-09 NOTE — Progress Notes (Signed)
Physical Therapy Treatment Patient Details Name: Ronald Hampton MRN: [id number] DOB: January 09, 1981 Today's Date: 06/09/2022   History of Present Illness Pt is a 41 y/o male who presented after a fall out of bed at home. Pt admitted for SIRS, incidentally COVID+, and possible AKI. Pt with recent hospitalization at [facility] (May-Oct 2023) after attempted suicide by overdose resulting in multiple complications including cerebellar ICH, intubation, small cerebellar infarcts, and lumbar discitis and osteomyelitis as well as paraspinal abscess w/ IV antibiotics. PMH: bipolar 1 disorder, depression, PTSD, functional paraplegia    PT Comments    Limited treatment today. Assisted with bed mobility and pericare/hygiene after having BM in bed. Patient tolerated balance activities sitting EOB but not agreeable to getting OOB or to work on transfer training today. Requires encouragement to participate further. Food tray set-up for pt, back in bed with call bell in reach. Patient will continue to benefit from skilled physical therapy services to further improve independence with functional mobility.    Recommendations for follow up therapy are one component of a multi-disciplinary discharge planning process, led by the attending physician.  Recommendations may be updated based on patient status, additional functional criteria and insurance authorization.  Follow Up Recommendations  Skilled nursing-short term rehab (<3 hours/day) Can patient physically be transported by private vehicle: No   Assistance Recommended at Discharge Intermittent Supervision/Assistance  Patient can return home with the following A lot of help with walking and/or transfers;A lot of help with bathing/dressing/bathroom;Assistance with cooking/housework   Equipment Recommendations  Wheelchair (measurements PT);Wheelchair cushion (measurements PT) (will consider sliding board and drop-arm bSC; may already have)     Recommendations for Other Services       Precautions / Restrictions Precautions Precautions: Fall Precaution Comments: Covid Restrictions Weight Bearing Restrictions: No     Mobility  Bed Mobility Overal bed mobility: Needs Assistance Bed Mobility: Supine to Sit, Rolling Rolling: Supervision   Supine to sit: Min guard     General bed mobility comments: Able to roll several times to assist with pericare/hygiene after having BM in bed. Use of rail to pull himself from side to side. Min guard to rise to EOB, somewhat effortful for patient but without physical assist. Cues for technique.    Transfers                   General transfer comment: Declines    Ambulation/Gait                   Stairs             Wheelchair Mobility    Modified Rankin (Stroke Patients Only)       Balance Overall balance assessment: Needs assistance Sitting-balance support: No upper extremity supported, Feet supported Sitting balance-Leahy Scale: Fair Sitting balance - Comments: Tolerated sitting EOB approx 5 min to assess LEs and work on seated balance. Shows some instability but without UE support remains seated.                                    Cognition Arousal/Alertness: Awake/alert Behavior During Therapy: Flat affect Overall Cognitive Status: Within Functional Limits for tasks assessed                                          Exercises  General Comments General comments (skin integrity, edema, etc.): Not agreeable for transfer training today after assisting with pericare, changing gown and linens, having pt move a lot in bed.      Pertinent Vitals/Pain Pain Assessment Pain Assessment: Faces Faces Pain Scale: Hurts little more Pain Location: back Pain Descriptors / Indicators: Aching Pain Intervention(s): Monitored during session, Repositioned    Home Living                          Prior  Function            PT Goals (current goals can now be found in the care plan section) Acute Rehab PT Goals Patient Stated Goal: independence PT Goal Formulation: With patient Time For Goal Achievement: 06/20/22 Potential to Achieve Goals: Fair Progress towards PT goals: Progressing toward goals    Frequency    Min 2X/week      PT Plan Current plan remains appropriate    Co-evaluation              AM-PAC PT "6 Clicks" Mobility   Outcome Measure  Help needed turning from your back to your side while in a flat bed without using bedrails?: A Little Help needed moving from lying on your back to sitting on the side of a flat bed without using bedrails?: A Little Help needed moving to and from a bed to a chair (including a wheelchair)?: A Little Help needed standing up from a chair using your arms (e.g., wheelchair or bedside chair)?: Total Help needed to walk in hospital room?: Total Help needed climbing 3-5 steps with a railing? : A Lot 6 Click Score: 13    End of Session Equipment Utilized During Treatment: Gait belt Activity Tolerance: Other (comment) (Self limited) Patient left: in bed;with bed alarm set;with call bell/phone within reach Nurse Communication:  (NT brought food to room, PT assisted with set-up) PT Visit Diagnosis: Muscle weakness (generalized) (M62.81);Other abnormalities of gait and mobility (R26.89)     Time: 1345-1410 PT Time Calculation (min) (ACUTE ONLY): 25 min  Charges:  $Therapeutic Activity: 8-22 mins                     Candie Mile, PT, DPT Physical Therapist Acute Rehabilitation Services [place] 06/09/2022, 2:45 PM

## 2022-06-10 DIAGNOSIS — U071 COVID-19: Secondary | ICD-10-CM | POA: Diagnosis not present

## 2022-06-10 NOTE — Progress Notes (Signed)
PROGRESS NOTE    Ronald Hampton  GYI:[id number] DOB: 11-Jun-1981 DOA: 06/02/2022 PCP: Patient, No Pcp Per    Brief Narrative:   Ronald Hampton is a 41 y.o. male with past medical history significant for DM2, chronic hepatitis C,  prior back surgeries, bipolar disorder, PTSD, testicular cancer, polysubstance abuse including prior IV drug use (methamphetamine and heroin presented to hospital after sustaining a fall out of the bed.    Recent hospitalization at [facility]  from 5/19-11/3 when patient had attempted suicide by heroin overdose which he relates to son dying of SIDS.  CT imaging showed acute cerebellar ICH with IVH.  Patient was found to be septic and intubated for airway protection.  MRI of the brain had noted small subacute infarcts in the bilateral cerebellar hemispheres and had septic emboli as he was found to have MRSA bacteremia.  No vegetations were noted on TEE although initial heart function was reduced 45-50%, but repeat echo 12/2021 noted EF to be 60-65%.  Patient had been put on broad-spectrum antibiotics and evaluated by ID.  Found to have C6/7, C7/T1, L2/L3, and L5/S1 discitis and osteomyelitis as well as paraspinal abscess.  He had completed IV antibiotics and repeat imaging noted improvement in discitis and osteomyelitis with no evidence of abscess on 04/01/22.  Infectious disease had recommended him to continue on doxycycline 100 mg p.o. twice daily for suppression but was not taking it.  For drug abuse patient had been put on methadone, but he appears not to have been on this medication.  During hospitalization, patient had developed critical illness myopathy and had been unable to walk.  During that hospital stay patient was noted to have hyperkalemia and SPEP  showed M-spike. Immunofixation showed serum immunofixation IgG Kappa spike.  Patient underwent bone marrow biopsy on 05/10/2022 and was positive for possible multiple myeloma.  He was scheduled to  follow-up with oncology in the outpatient setting.  His creatinine was around 2.0 at discharge.  After he was discharged from the hospital he went to skilled nursing facility since he was unable to take care of himself. Subsequently, patient had left the facility and went to live with his Aunt and was not taking any of his medications.  He had had a fall 3 days prior to presentation and started having fevers.    In the ED, patient was noted to be tachycardic, tachypneic with elevated blood pressure.  Labs showed mild hypokalemia with creatinine elevated at 2.3, calcium 11.3, CK 291, and lactic acid within normal limits.  CT scan of the head and cervical spine noted no acute abnormality with fusion at C6-T1.  CT scan of the abdomen pelvis noted large amount of stool throughout the colon, stable splenomegaly, and and chronic appearing fractures at L1/L2/ L4.  CT of the left knee noted anterior soft tissue swelling without acute fracture or dislocation and advanced osteoarthritis.  Blood cultures were obtained.  Patient had been bolused 2 L of normal saline IV fluid, potassium chloride 10 mEq IV, Phenergan, and morphine for pain.  Patient also tested positive for COVID-19 infection.  Patient was then admitted hospital for further evaluation and treatment.  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  CK  291.  --Continue gabapentin  --PT/OT recommend SNF placement, TOC for assistance --Continue therapy efforts while inpatient   SIRS Mildly febrile tachycardic tachypneic on presentation.  Patient with history  of MRSA bacteremia, ESBL E.  Klebsiella, and osteomyelitis/discitis.  Patient was supposed to be on doxycycline suppression but was not taking it.   --Blood cultures: No growth x 5 days --Urine culture less than 10,000 colonies of insignificant growth. --Continue Doxycycline 100 mg p.o. twice daily    COVID-19 viral infection Incidental finding with fever malaise.  No hypoxia.  Fever is now resolved.  Completed 5-day course of Molnupiravir. --Supportive care --Continue airborne/contact isolation for 10 days from date of diagnosis (11/19)   Hypercalcemia/monoclonal gammopathy Calcium 11.3 on admission, has improved with IV fluids.  Patient has been seen by oncology as outpatient who has an impression of monoclonal gammopathy versus the possibility of POEMS.  Plan was EMG studies.  Patient follows up with hematology at [facility].  Continue outpatient follow-up   Mild acute kidney injury on chronic kidney disease stage stage IIIb Presenting creatinine at 2.3.  Baseline around 2.0..  Patient received IV hydration with improvement in creatinine level at 1.52 11/22.   Mild hypokalemia.   Repleted during hospitalization   Hypomagnesemia.  Repleted during hospitalization  History of osteomyelitis/discitis, MRSA bacteremia Continue doxycycline for chronic suppression.  Initial proposed end date was 07-17-22.   --Blood cultures x 2: No growth x 5 days --Continue doxycycline 100 mg p.o. 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 Madison Hickman.  Last seen November 2023 with recommendations of continued surveillance with labs every 3 months.  Continue outpatient follow-up.   Constipation Noted on CT scan of the abdomen.   --Continue Senokot-S1 tablet p.o. twice daily --MiraLAX p.o. daily   Elevated LFTs/hyperbilirubinemia: Resolved AST 49, ALT 52, and total bilirubin 1.9.  LFTs and bilirubin now have normalized.   Normocytic anemia Hemoglobin stable, 10.8.   Depression --Continue Cymbalta   Chronic hepatitis C --Outpatient follow-up with infectious disease on discharge for consideration of treatment outpatient.   History of CVA history of ICH and IVH Continue aspirin   History of polysubstance abuse Patient with history of IV drug  abuse including heroin and methamphetamines.  Urine drug screen positive for opiates and amphetamine at this time.  He was supposed to be on methadone not taking it.  Patient was counseled about it.   Stage I coccyx pressure ulceration, POA Pressure Injury 06/03/22 Coccyx Mid Stage 1 -  Intact skin with non-blanchable redness of a localized area usually over a bony prominence. (Active)  06/03/22 1629  Location: Coccyx  Location Orientation: Mid  Staging: Stage 1 -  Intact skin with non-blanchable redness of a localized area usually over a bony prominence.  Wound Description (Comments):   Present on Admission: Yes  -- Wound RN consulted.  Continue local wound care, offloading  Insomnia -- Ambien 5 mg p.o. nightly   DVT prophylaxis: heparin injection 5,000 Units Start: 06/03/22 1400    Code Status: Full Code Family Communication: No family present at bedside this morning  Disposition Plan:  Level of care: Med-Surg Status is: Inpatient Remains inpatient appropriate because: Pending SNF placement    Consultants:  none  Procedures:  none  Antimicrobials:   Molnupiravir 11/19>>   Subjective: Patient seen examined bedside, resting comfortably.  Lying in bed watching TV.  No complaints or concerns this morning.  Denies headache, no dizziness, no current fever, no chills/night sweats, no nausea/vomiting, no diarrhea, no chest pain, no palpitations, no abdominal pain, no cough/congestion.  No acute events overnight per nursing staff.  Medically stable for discharge to SNF  once bed available, anticipate likely will need 10-day quarantine from initial diagnosis of COVID-19 viral infection.  Objective: Vitals:   06/09/22 0436 06/09/22 1559 06/09/22 2012 06/10/22 0337  BP: 124/84 130/81 116/81 129/81  Pulse: 95 100 92 97  Resp: _0 Temp: 98.4 F (36.9 C) 98.7 F (37.1 C) 98.2 F (36.8 C) 98.7 F (37.1 C)  TempSrc: Oral Oral Oral Oral  SpO2: 100% 100% 99% 98%  Weight:       Height:        Intake/Output Summary (Last 24 hours) at 06/10/2022 1059 Last data filed at 06/10/2022 0900 Gross per 24 hour  Intake 1428 ml  Output 2700 ml  Net -1272 ml   Filed Weights   06/03/22 1621 06/04/22 2038 06/05/22 2037  Weight: 86.3 kg 88.6 kg 86.9 kg    Examination:  Physical Exam: GEN: NAD, alert and oriented x 3, chronically ill appearance, appears older than stated age HEENT: NCAT, PERRL, EOMI, sclera clear, MMM, poor dentition PULM: CTAB w/o wheezes/crackles, normal respiratory effort, on room air CV: RRR w/o M/G/R GI: abd soft, NTND, NABS, no R/G/M MSK: no peripheral edema, moves all extremities independently; decreased muscle tone NEURO: CN II-XII intact, no focal deficits PSYCH: normal mood/affect Integumentary: dry/intact, no rashes or wounds    Data Reviewed: I have personally reviewed following labs and imaging studies  CBC: Recent Labs  Lab 06/04/22 0725 06/05/22 0519 06/06/22 0422  WBC 6.6 6.3 7.0  HGB 9.5* 10.1* 10.8*  HCT 26.7* 27.9* 29.8*  MCV 82.9 82.5 82.8  PLT 119* 122* 338   Basic Metabolic Panel: Recent Labs  Lab 06/04/22 0725 06/05/22 0519 06/06/22 0422  NA 137 133* 134*  K 3.4* 3.5 3.7  CL 110 101 101  CO2 19* 23 23  GLUCOSE 89 92 90  BUN 26* 19 21*  CREATININE 1.90* 1.59* 1.52*  CALCIUM 8.8* 9.0 9.2  MG  --  1.4* 1.9   GFR: Estimated Creatinine Clearance: 70.5 mL/min (A) (by C-G formula based on SCr of 1.52 mg/dL (H)). Liver Function Tests: Recent Labs  Lab 06/04/22 0725  AST 27  ALT 31  ALKPHOS 42  BILITOT 0.7  PROT 6.1*  ALBUMIN 2.7*   No results for input(s): "LIPASE", "AMYLASE" in the last 168 hours.  No results for input(s): "AMMONIA" in the last 168 hours. Coagulation Profile: No results for input(s): "INR", "PROTIME" in the last 168 hours. Cardiac Enzymes: No results for input(s): "CKTOTAL", "CKMB", "CKMBINDEX", "TROPONINI" in the last 168 hours.  BNP (last 3 results) No results for  input(s): "PROBNP" in the last 8760 hours. HbA1C: No results for input(s): "HGBA1C" in the last 72 hours. CBG: No results for input(s): "GLUCAP" in the last 168 hours. Lipid Profile: No results for input(s): "CHOL", "HDL", "LDLCALC", "TRIG", "CHOLHDL", "LDLDIRECT" in the last 72 hours. Thyroid Function Tests: No results for input(s): "TSH", "T4TOTAL", "FREET4", "T3FREE", "THYROIDAB" in the last 72 hours. Anemia Panel: No results for input(s): "VITAMINB12", "FOLATE", "FERRITIN", "TIBC", "IRON", "RETICCTPCT" in the last 72 hours. Sepsis Labs: No results for input(s): "PROCALCITON", "LATICACIDVEN" in the last 168 hours.   Recent Results (from the past 240 hour(s))  Culture, blood (routine x 2)     Status: None   Collection Time: 06/03/22 12:23 AM   Specimen: BLOOD RIGHT ARM  Result Value Ref Range Status   Specimen Description BLOOD RIGHT ARM  Final   Special Requests   Final    BOTTLES DRAWN AEROBIC AND ANAEROBIC Blood  Culture results may not be optimal due to an excessive volume of blood received in culture bottles   Culture   Final    NO GROWTH 5 DAYS Performed at [facility], [place] [address], [place], [place] [address]    Report Status 06/08/2022 FINAL  Final  Culture, blood (routine x 2)     Status: None   Collection Time: 06/03/22  4:56 AM   Specimen: BLOOD RIGHT HAND  Result Value Ref Range Status   Specimen Description BLOOD RIGHT HAND  Final   Special Requests   Final    BOTTLES DRAWN AEROBIC ONLY Blood Culture adequate volume   Culture   Final    NO GROWTH 5 DAYS Performed at [facility], [place] [address], [place], [place] [address]    Report Status 06/08/2022 FINAL  Final  Resp Panel by RT-PCR (Flu A&B, Covid) Anterior Nasal Swab     Status: Abnormal   Collection Time: 06/03/22 12:44 PM   Specimen: Anterior Nasal Swab  Result Value Ref Range Status   SARS Coronavirus 2 by RT PCR POSITIVE (A) NEGATIVE Final    Comment: (NOTE) SARS-CoV-2 target nucleic  acids are DETECTED.  The SARS-CoV-2 RNA is generally detectable in upper respiratory specimens during the acute phase of infection. Positive results are indicative of the presence of the identified virus, but do not rule out bacterial infection or co-infection with other pathogens not detected by the test. Clinical correlation with patient history and other diagnostic information is necessary to determine patient infection status. The expected result is Negative.  Fact Sheet for Patients: EntrepreneurPulse.com.au  Fact Sheet for Healthcare Providers: IncredibleEmployment.be  This test is not yet approved or cleared by the [place] FDA and  has been authorized for detection and/or diagnosis of SARS-CoV-2 by FDA under an Emergency Use Authorization (EUA).  This EUA will remain in effect (meaning this test can be used) for the duration of  the COVID-19 declaration under Section 564(b)(1) of the A ct, 21 U.S.C. section 360bbb-3(b)(1), unless the authorization is terminated or revoked sooner.     Influenza A by PCR NEGATIVE NEGATIVE Final   Influenza B by PCR NEGATIVE NEGATIVE Final    Comment: (NOTE) The Xpert Xpress SARS-CoV-2/FLU/RSV plus assay is intended as an aid in the diagnosis of influenza from Nasopharyngeal swab specimens and should not be used as a sole basis for treatment. Nasal washings and aspirates are unacceptable for Xpert Xpress SARS-CoV-2/FLU/RSV testing.  Fact Sheet for Patients: EntrepreneurPulse.com.au  Fact Sheet for Healthcare Providers: IncredibleEmployment.be  This test is not yet approved or cleared by the [place] FDA and has been authorized for detection and/or diagnosis of SARS-CoV-2 by FDA under an Emergency Use Authorization (EUA). This EUA will remain in effect (meaning this test can be used) for the duration of the COVID-19 declaration under Section 564(b)(1) of  the Act, 21 U.S.C. section 360bbb-3(b)(1), unless the authorization is terminated or revoked.  Performed at [facility], [place] [address], [place], [place] [address]   Urine Culture     Status: Abnormal   Collection Time: 06/03/22  1:26 PM   Specimen: Urine, Catheterized  Result Value Ref Range Status   Specimen Description URINE, CATHETERIZED  Final   Special Requests NONE  Final   Culture (A)  Final    <10,000 COLONIES/mL INSIGNIFICANT GROWTH Performed at [facility], [place] [address] [place], [place] [address]    Report Status 06/04/2022 FINAL  Final  Radiology Studies: No results found.      Scheduled Meds:  vitamin C  500 mg Oral Daily   aspirin EC  81 mg Oral Daily   doxycycline  100 mg Oral Q12H   DULoxetine  40 mg Oral BID   gabapentin  300 mg Oral BID   heparin  5,000 Units Subcutaneous Q8H   lidocaine  1 patch Transdermal Q24H   magnesium oxide  400 mg Oral BID   polyethylene glycol  17 g Oral Daily   senna-docusate  1 tablet Oral BID   sodium chloride flush  3 mL Intravenous Q12H   zinc sulfate  220 mg Oral Daily   zolpidem  5 mg Oral QHS   Continuous Infusions:  promethazine (PHENERGAN) injection (IM or IVPB) 12.5 mg (06/08/22 2140)     LOS: 7 days    Time spent: 49 minutes spent on chart review, discussion with nursing staff, consultants, updating family and interview/physical exam; more than 50% of that time was spent in counseling and/or coordination of care.     J [place] DO [facility] Available via Epic secure chat 7am-7pm After these hours, please refer to coverage provider listed on [email or web address] 06/10/2022, 10:59 AM

## 2022-06-10 NOTE — Plan of Care (Signed)
  Problem: Pain Managment: Goal: General experience of comfort will improve Outcome: Not Progressing   Problem: Safety: Goal: Ability to remain free from injury will improve Outcome: Not Progressing   Problem: Skin Integrity: Goal: Risk for impaired skin integrity will decrease Outcome: Not Progressing

## 2022-06-11 LAB — CBC
HCT: 27.7 % — ABNORMAL LOW (ref 39.0–52.0)
Hemoglobin: 9.3 g/dL — ABNORMAL LOW (ref 13.0–17.0)
MCH: 29.2 pg (ref 26.0–34.0)
MCHC: 33.6 g/dL (ref 30.0–36.0)
MCV: 87.1 fL (ref 80.0–100.0)
Platelets: 277 10*3/uL (ref 150–400)
RBC: 3.18 MIL/uL — ABNORMAL LOW (ref 4.22–5.81)
RDW: 15.2 % (ref 11.5–15.5)
WBC: 8.8 10*3/uL (ref 4.0–10.5)
nRBC: 0 % (ref 0.0–0.2)

## 2022-06-11 LAB — RAPID URINE DRUG SCREEN, HOSP PERFORMED
Amphetamines: NOT DETECTED
Barbiturates: NOT DETECTED
Benzodiazepines: NOT DETECTED
Cocaine: NOT DETECTED
Opiates: POSITIVE — AB
Tetrahydrocannabinol: NOT DETECTED

## 2022-06-11 LAB — BASIC METABOLIC PANEL
Anion gap: 8 (ref 5–15)
BUN: 24 mg/dL — ABNORMAL HIGH (ref 6–20)
CO2: 26 mmol/L (ref 22–32)
Calcium: 9.6 mg/dL (ref 8.9–10.3)
Chloride: 105 mmol/L (ref 98–111)
Creatinine, Ser: 1.8 mg/dL — ABNORMAL HIGH (ref 0.61–1.24)
GFR, Estimated: 48 mL/min — ABNORMAL LOW (ref >60)
Glucose, Bld: 113 mg/dL — ABNORMAL HIGH (ref 70–99)
Potassium: 4 mmol/L (ref 3.5–5.1)
Sodium: 139 mmol/L (ref 135–145)

## 2022-06-11 LAB — MAGNESIUM: Magnesium: 1.7 mg/dL (ref 1.7–2.4)

## 2022-06-11 NOTE — TOC Progression Note (Signed)
Transition of Care [facility]) - Progression Note    Patient Details  Name: Ronald Hampton MRN: [id number] Date of Birth: 05/10/1981  Transition of Care [facility]) CM/SW [place], LCSW Phone Number: 06/11/2022, 10:20 AM  Clinical Narrative:    No SNF bed offers for patient.    Expected Discharge Plan: [place] Barriers to Discharge: Continued Medical Work up, Ship broker, SNF Pending bed offer  Expected Discharge Plan and Services Expected Discharge Plan: [place] arrangements for the past 2 months: Apartment                                       Social Determinants of Health (SDOH) Interventions    Readmission Risk Interventions     No data to display

## 2022-06-11 NOTE — Progress Notes (Signed)
OT Cancellation Note  Patient Details Name: Ronald Hampton MRN: [id number] DOB: 1981-01-21   Cancelled Treatment:    Reason Eval/Treat Not Completed: Patient declined, no reason specified Pt reported preference to sleep after receiving pain meds. Will follow up tomorrow for OT eval.  Layla Maw 06/11/2022, 10:20 AM

## 2022-06-11 NOTE — Plan of Care (Signed)
  Problem: Pain Managment: Goal: General experience of comfort will improve Outcome: Not Progressing   Problem: Safety: Goal: Ability to remain free from injury will improve Outcome: Not Progressing   

## 2022-06-11 NOTE — Plan of Care (Signed)
  Problem: Respiratory: Goal: Will maintain a patent airway Outcome: Progressing Goal: Complications related to the disease process, condition or treatment will be avoided or minimized Outcome: Progressing   Problem: Education: Goal: Knowledge of General Education information will improve Description: Including pain rating scale, medication(s)/side effects and non-pharmacologic comfort measures Outcome: Progressing   Problem: Health Behavior/Discharge Planning: Goal: Ability to manage health-related needs will improve Outcome: Progressing   Problem: Clinical Measurements: Goal: Ability to maintain clinical measurements within normal limits will improve Outcome: Progressing Goal: Will remain free from infection Outcome: Progressing Goal: Diagnostic test results will improve Outcome: Progressing Goal: Respiratory complications will improve Outcome: Progressing Goal: Cardiovascular complication will be avoided Outcome: Progressing   Problem: Activity: Goal: Risk for activity intolerance will decrease Outcome: Progressing   Problem: Nutrition: Goal: Adequate nutrition will be maintained Outcome: Progressing   Problem: Coping: Goal: Level of anxiety will decrease Outcome: Progressing   Problem: Elimination: Goal: Will not experience complications related to bowel motility Outcome: Progressing Goal: Will not experience complications related to urinary retention Outcome: Progressing   Problem: Pain Managment: Goal: General experience of comfort will improve Outcome: Progressing   Problem: Safety: Goal: Ability to remain free from injury will improve Outcome: Progressing   Problem: Skin Integrity: Goal: Risk for impaired skin integrity will decrease Outcome: Progressing   

## 2022-06-11 NOTE — Progress Notes (Signed)
PROGRESS NOTE    Ronald Hampton  GYI:[id number] DOB: 11-Jun-1981 DOA: 06/02/2022 PCP: Patient, No Pcp Per    Brief Narrative:   Ronald Hampton is a 41 y.o. male with past medical history significant for DM2, chronic hepatitis C,  prior back surgeries, bipolar disorder, PTSD, testicular cancer, polysubstance abuse including prior IV drug use (methamphetamine and heroin presented to hospital after sustaining a fall out of the bed.    Recent hospitalization at [facility]  from 5/19-11/3 when patient had attempted suicide by heroin overdose which he relates to son dying of SIDS.  CT imaging showed acute cerebellar ICH with IVH.  Patient was found to be septic and intubated for airway protection.  MRI of the brain had noted small subacute infarcts in the bilateral cerebellar hemispheres and had septic emboli as he was found to have MRSA bacteremia.  No vegetations were noted on TEE although initial heart function was reduced 45-50%, but repeat echo 12/2021 noted EF to be 60-65%.  Patient had been put on broad-spectrum antibiotics and evaluated by ID.  Found to have C6/7, C7/T1, L2/L3, and L5/S1 discitis and osteomyelitis as well as paraspinal abscess.  He had completed IV antibiotics and repeat imaging noted improvement in discitis and osteomyelitis with no evidence of abscess on 04/01/22.  Infectious disease had recommended him to continue on doxycycline 100 mg p.o. twice daily for suppression but was not taking it.  For drug abuse patient had been put on methadone, but he appears not to have been on this medication.  During hospitalization, patient had developed critical illness myopathy and had been unable to walk.  During that hospital stay patient was noted to have hyperkalemia and SPEP  showed M-spike. Immunofixation showed serum immunofixation IgG Kappa spike.  Patient underwent bone marrow biopsy on 05/10/2022 and was positive for possible multiple myeloma.  He was scheduled to  follow-up with oncology in the outpatient setting.  His creatinine was around 2.0 at discharge.  After he was discharged from the hospital he went to skilled nursing facility since he was unable to take care of himself. Subsequently, patient had left the facility and went to live with his Aunt and was not taking any of his medications.  He had had a fall 3 days prior to presentation and started having fevers.    In the ED, patient was noted to be tachycardic, tachypneic with elevated blood pressure.  Labs showed mild hypokalemia with creatinine elevated at 2.3, calcium 11.3, CK 291, and lactic acid within normal limits.  CT scan of the head and cervical spine noted no acute abnormality with fusion at C6-T1.  CT scan of the abdomen pelvis noted large amount of stool throughout the colon, stable splenomegaly, and and chronic appearing fractures at L1/L2/ L4.  CT of the left knee noted anterior soft tissue swelling without acute fracture or dislocation and advanced osteoarthritis.  Blood cultures were obtained.  Patient had been bolused 2 L of normal saline IV fluid, potassium chloride 10 mEq IV, Phenergan, and morphine for pain.  Patient also tested positive for COVID-19 infection.  Patient was then admitted hospital for further evaluation and treatment.  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  CK  291.  --Continue gabapentin  --PT/OT recommend SNF placement, TOC for assistance --Continue therapy efforts while inpatient   SIRS Mildly febrile tachycardic tachypneic on presentation.  Patient with history  of MRSA bacteremia, ESBL E.  Klebsiella, and osteomyelitis/discitis.  Patient was supposed to be on doxycycline suppression but was not taking it.   --Blood cultures: No growth x 5 days --Urine culture less than 10,000 colonies of insignificant growth. --Continue Doxycycline 100 mg p.o. twice daily    COVID-19 viral infection Incidental finding with fever malaise.  No hypoxia.  Fever is now resolved.  Completed 5-day course of Molnupiravir. 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  Patient has been seen by oncology as outpatient who has an impression of monoclonal gammopathy versus the possibility of POEMS.  Plan was EMG studies.  Patient follows up with hematology at [facility], Dr. Madison Hickman.  Continue outpatient follow-up with recommendation of continued surveillance with labs every 3 months.   Mild acute kidney injury on chronic kidney disease stage stage IIIb Presenting creatinine at 2.3.  Baseline around 2.0..  Patient received IV hydration with improvement in creatinine level at 1.52 11/22.   Mild hypokalemia.   Repleted during hospitalization   Hypomagnesemia.  Repleted during hospitalization  History of osteomyelitis/discitis, MRSA bacteremia Continue doxycycline for chronic suppression.  Initial proposed end date was 07-17-22.   --Blood cultures x 2: No growth x 5 days --Continue doxycycline 100 mg p.o. every 12 hours  Constipation Noted on CT scan of the abdomen.   --Continue Senokot-S1 tablet p.o. twice daily --MiraLAX p.o. daily   Elevated LFTs/hyperbilirubinemia: Resolved AST 49, ALT 52, and total bilirubin 1.9.  LFTs and bilirubin now have normalized.   Normocytic anemia Hemoglobin stable, 10.8.   Depression --Continue Cymbalta   Chronic hepatitis C --Outpatient follow-up with infectious disease on discharge for consideration of treatment outpatient.   History of CVA history of ICH and IVH Continue aspirin   History of polysubstance abuse Patient with history of IV drug abuse including heroin and methamphetamines.  Urine drug screen positive for opiates and amphetamine at this time.  He was  supposed to be on methadone not taking it.  Patient was counseled about it.   Stage I coccyx pressure ulceration, POA Pressure Injury 06/03/22 Coccyx Mid Stage 1 -  Intact skin with non-blanchable redness of a localized area usually over a bony prominence. (Active)  06/03/22 1629  Location: Coccyx  Location Orientation: Mid  Staging: Stage 1 -  Intact skin with non-blanchable redness of a localized area usually over a bony prominence.  Wound Description (Comments):   Present on Admission: Yes  -- Wound RN consulted.  Continue local wound care, offloading  Insomnia -- Ambien 5 mg p.o. nightly   DVT prophylaxis: heparin injection 5,000 Units Start: 06/03/22 1400    Code Status: Full Code Family Communication: No family present at bedside this morning  Disposition Plan:  Level of care: Med-Surg Status is: Inpatient Remains inpatient appropriate because: Pending SNF placement    Consultants:  none  Procedures:  none  Antimicrobials:   Molnupiravir 11/19>>   Subjective: Patient seen examined bedside, resting comfortably.  Lying in bed watching TV.  Complains of headache, otherwise no other complaints or concerns this morning.  Denies dizziness, no  fever, no chills/night sweats, no nausea/vomiting, no diarrhea, no chest pain, no palpitations, no abdominal pain, no cough/congestion.  No acute events overnight per nursing staff.  Medically stable for discharge to SNF once bed available, anticipate likely will need 10-day quarantine from initial diagnosis of COVID-19 viral infection.  Objective: Vitals:   06/10/22 1712 06/10/22 2148 06/11/22 0557 06/11/22 0823  BP: 133/80 124/85 122/81 125/72  Pulse: (!) 110 (!) 107 95 100  Resp: _0 Temp: 98.1 F (36.7 C) 99.1 F (37.3 C) 98.7 F (37.1 C) 98.5 F (36.9 C)  TempSrc: Oral Oral Oral Oral  SpO2:  96% 98% 97%  Weight:   92 kg   Height:        Intake/Output Summary (Last 24 hours) at 06/11/2022 1034 Last data  filed at 06/11/2022 0200 Gross per 24 hour  Intake 1074 ml  Output 2000 ml  Net -926 ml   Filed Weights   06/04/22 2038 06/05/22 2037 06/11/22 0557  Weight: 88.6 kg 86.9 kg 92 kg    Examination:  Physical Exam: GEN: NAD, alert and oriented x 3, chronically ill appearance, appears older than stated age HEENT: NCAT, PERRL, EOMI, sclera clear, MMM, poor dentition PULM: CTAB w/o wheezes/crackles, normal respiratory effort, on room air CV: RRR w/o M/G/R GI: abd soft, NTND, NABS, no R/G/M MSK: no peripheral edema, moves all extremities independently; decreased muscle tone NEURO: CN II-XII intact, no focal deficits PSYCH: normal mood/affect Integumentary: dry/intact, no rashes or wounds    Data Reviewed: I have personally reviewed following labs and imaging studies  CBC: Recent Labs  Lab 06/05/22 0519 06/06/22 0422 06/11/22 0231  WBC 6.3 7.0 8.8  HGB 10.1* 10.8* 9.3*  HCT 27.9* 29.8* 27.7*  MCV 82.5 82.8 87.1  PLT 122* 177 820   Basic Metabolic Panel: Recent Labs  Lab 06/05/22 0519 06/06/22 0422 06/11/22 0231  NA 133* 134* 139  K 3.5 3.7 4.0  CL 101 101 105  CO2 _1 GLUCOSE 92 90 113*  BUN 19 21* 24*  CREATININE 1.59* 1.52* 1.80*  CALCIUM 9.0 9.2 9.6  MG 1.4* 1.9 1.7   GFR: Estimated Creatinine Clearance: 61.1 mL/min (A) (by C-G formula based on SCr of 1.8 mg/dL (H)). Liver Function Tests: No results for input(s): "AST", "ALT", "ALKPHOS", "BILITOT", "PROT", "ALBUMIN" in the last 168 hours.  No results for input(s): "LIPASE", "AMYLASE" in the last 168 hours.  No results for input(s): "AMMONIA" in the last 168 hours. Coagulation Profile: No results for input(s): "INR", "PROTIME" in the last 168 hours. Cardiac Enzymes: No results for input(s): "CKTOTAL", "CKMB", "CKMBINDEX", "TROPONINI" in the last 168 hours.  BNP (last 3 results) No results for input(s): "PROBNP" in the last 8760 hours. HbA1C: No results for input(s): "HGBA1C" in the last 72  hours. CBG: No results for input(s): "GLUCAP" in the last 168 hours. Lipid Profile: No results for input(s): "CHOL", "HDL", "LDLCALC", "TRIG", "CHOLHDL", "LDLDIRECT" in the last 72 hours. Thyroid Function Tests: No results for input(s): "TSH", "T4TOTAL", "FREET4", "T3FREE", "THYROIDAB" in the last 72 hours. Anemia Panel: No results for input(s): "VITAMINB12", "FOLATE", "FERRITIN", "TIBC", "IRON", "RETICCTPCT" in the last 72 hours. Sepsis Labs: No results for input(s): "PROCALCITON", "LATICACIDVEN" in the last 168 hours.   Recent Results (from the past 240 hour(s))  Culture, blood (routine x 2)     Status: None   Collection Time: 06/03/22 12:23 AM   Specimen: BLOOD RIGHT ARM  Result Value Ref Range Status   Specimen Description BLOOD RIGHT ARM  Final   Special Requests   Final    BOTTLES DRAWN AEROBIC AND ANAEROBIC Blood Culture results may not be optimal due to an excessive volume of blood received in culture bottles   Culture   Final  NO GROWTH 5 DAYS Performed at [facility], [place] [address], [place], [place] [address]    Report Status 06/08/2022 FINAL  Final  Culture, blood (routine x 2)     Status: None   Collection Time: 06/03/22  4:56 AM   Specimen: BLOOD RIGHT HAND  Result Value Ref Range Status   Specimen Description BLOOD RIGHT HAND  Final   Special Requests   Final    BOTTLES DRAWN AEROBIC ONLY Blood Culture adequate volume   Culture   Final    NO GROWTH 5 DAYS Performed at [facility], [place] [address], [place], [place] [address]    Report Status 06/08/2022 FINAL  Final  Resp Panel by RT-PCR (Flu A&B, Covid) Anterior Nasal Swab     Status: Abnormal   Collection Time: 06/03/22 12:44 PM   Specimen: Anterior Nasal Swab  Result Value Ref Range Status   SARS Coronavirus 2 by RT PCR POSITIVE (A) NEGATIVE Final    Comment: (NOTE) SARS-CoV-2 target nucleic acids are DETECTED.  The SARS-CoV-2 RNA is generally detectable in upper respiratory specimens  during the acute phase of infection. Positive results are indicative of the presence of the identified virus, but do not rule out bacterial infection or co-infection with other pathogens not detected by the test. Clinical correlation with patient history and other diagnostic information is necessary to determine patient infection status. The expected result is Negative.  Fact Sheet for Patients: EntrepreneurPulse.com.au  Fact Sheet for Healthcare Providers: IncredibleEmployment.be  This test is not yet approved or cleared by the [place] FDA and  has been authorized for detection and/or diagnosis of SARS-CoV-2 by FDA under an Emergency Use Authorization (EUA).  This EUA will remain in effect (meaning this test can be used) for the duration of  the COVID-19 declaration under Section 564(b)(1) of the A ct, 21 U.S.C. section 360bbb-3(b)(1), unless the authorization is terminated or revoked sooner.     Influenza A by PCR NEGATIVE NEGATIVE Final   Influenza B by PCR NEGATIVE NEGATIVE Final    Comment: (NOTE) The Xpert Xpress SARS-CoV-2/FLU/RSV plus assay is intended as an aid in the diagnosis of influenza from Nasopharyngeal swab specimens and should not be used as a sole basis for treatment. Nasal washings and aspirates are unacceptable for Xpert Xpress SARS-CoV-2/FLU/RSV testing.  Fact Sheet for Patients: EntrepreneurPulse.com.au  Fact Sheet for Healthcare Providers: IncredibleEmployment.be  This test is not yet approved or cleared by the [place] FDA and has been authorized for detection and/or diagnosis of SARS-CoV-2 by FDA under an Emergency Use Authorization (EUA). This EUA will remain in effect (meaning this test can be used) for the duration of the COVID-19 declaration under Section 564(b)(1) of the Act, 21 U.S.C. section 360bbb-3(b)(1), unless the authorization is terminated  or revoked.  Performed at [facility], [place] [address] [place], [place] [address]   Urine Culture     Status: Abnormal   Collection Time: 06/03/22  1:26 PM   Specimen: Urine, Catheterized  Result Value Ref Range Status   Specimen Description URINE, CATHETERIZED  Final   Special Requests NONE  Final   Culture (A)  Final    <10,000 COLONIES/mL INSIGNIFICANT GROWTH Performed at [facility], [place] [address], [place], [place] [address]    Report Status 06/04/2022 FINAL  Final         Radiology Studies: No results found.      Scheduled Meds:  vitamin C  500 mg  Oral Daily   aspirin EC  81 mg Oral Daily   doxycycline  100 mg Oral Q12H   DULoxetine  40 mg Oral BID   gabapentin  300 mg Oral BID   heparin  5,000 Units Subcutaneous Q8H   lidocaine  1 patch Transdermal Q24H   magnesium oxide  400 mg Oral BID   polyethylene glycol  17 g Oral Daily   senna-docusate  1 tablet Oral BID   sodium chloride flush  3 mL Intravenous Q12H   zinc sulfate  220 mg Oral Daily   zolpidem  5 mg Oral QHS   Continuous Infusions:  promethazine (PHENERGAN) injection (IM or IVPB) 12.5 mg (06/08/22 2140)     LOS: 8 days    Time spent: 49 minutes spent on chart review, discussion with nursing staff, consultants, updating family and interview/physical exam; more than 50% of that time was spent in counseling and/or coordination of care.    Tanvi Gatling J [place] DO [facility] Available via Epic secure chat 7am-7pm After these hours, please refer to coverage provider listed on [email or web address] 06/11/2022, 10:34 AM

## 2022-06-12 ENCOUNTER — Inpatient Hospital Stay (HOSPITAL_COMMUNITY): Payer: No Typology Code available for payment source

## 2022-06-12 LAB — COMPREHENSIVE METABOLIC PANEL
ALT: 71 U/L — ABNORMAL HIGH (ref 0–44)
AST: 78 U/L — ABNORMAL HIGH (ref 15–41)
Albumin: 3.3 g/dL — ABNORMAL LOW (ref 3.5–5.0)
Alkaline Phosphatase: 90 U/L (ref 38–126)
Anion gap: 10 (ref 5–15)
BUN: 24 mg/dL — ABNORMAL HIGH (ref 6–20)
CO2: 26 mmol/L (ref 22–32)
Calcium: 9.5 mg/dL (ref 8.9–10.3)
Chloride: 102 mmol/L (ref 98–111)
Creatinine, Ser: 1.63 mg/dL — ABNORMAL HIGH (ref 0.61–1.24)
GFR, Estimated: 54 mL/min — ABNORMAL LOW (ref >60)
Glucose, Bld: 122 mg/dL — ABNORMAL HIGH (ref 70–99)
Potassium: 4.4 mmol/L (ref 3.5–5.1)
Sodium: 138 mmol/L (ref 135–145)
Total Bilirubin: 0.8 mg/dL (ref 0.3–1.2)
Total Protein: 7.4 g/dL (ref 6.5–8.1)

## 2022-06-12 LAB — CBC
HCT: 30.7 % — ABNORMAL LOW (ref 39.0–52.0)
Hemoglobin: 10.3 g/dL — ABNORMAL LOW (ref 13.0–17.0)
MCH: 29.5 pg (ref 26.0–34.0)
MCHC: 33.6 g/dL (ref 30.0–36.0)
MCV: 88 fL (ref 80.0–100.0)
Platelets: 324 10*3/uL (ref 150–400)
RBC: 3.49 MIL/uL — ABNORMAL LOW (ref 4.22–5.81)
RDW: 15.4 % (ref 11.5–15.5)
WBC: 7.8 10*3/uL (ref 4.0–10.5)
nRBC: 0 % (ref 0.0–0.2)

## 2022-06-12 LAB — D-DIMER, QUANTITATIVE: D-Dimer, Quant: 1.26 ug/mL-FEU — ABNORMAL HIGH (ref 0.00–0.50)

## 2022-06-12 LAB — GLUCOSE, CAPILLARY: Glucose-Capillary: 127 mg/dL — ABNORMAL HIGH (ref 70–99)

## 2022-06-12 NOTE — Progress Notes (Signed)
PROGRESS NOTE    Ronald Hampton  GYI:[id number] DOB: 11-Jun-1981 DOA: 06/02/2022 PCP: Patient, No Pcp Per    Brief Narrative:   Ronald Hampton is a 41 y.o. male with past medical history significant for DM2, chronic hepatitis C,  prior back surgeries, bipolar disorder, PTSD, testicular cancer, polysubstance abuse including prior IV drug use (methamphetamine and heroin presented to hospital after sustaining a fall out of the bed.    Recent hospitalization at [facility]  from 5/19-11/3 when patient had attempted suicide by heroin overdose which he relates to son dying of SIDS.  CT imaging showed acute cerebellar ICH with IVH.  Patient was found to be septic and intubated for airway protection.  MRI of the brain had noted small subacute infarcts in the bilateral cerebellar hemispheres and had septic emboli as he was found to have MRSA bacteremia.  No vegetations were noted on TEE although initial heart function was reduced 45-50%, but repeat echo 12/2021 noted EF to be 60-65%.  Patient had been put on broad-spectrum antibiotics and evaluated by ID.  Found to have C6/7, C7/T1, L2/L3, and L5/S1 discitis and osteomyelitis as well as paraspinal abscess.  He had completed IV antibiotics and repeat imaging noted improvement in discitis and osteomyelitis with no evidence of abscess on 04/01/22.  Infectious disease had recommended him to continue on doxycycline 100 mg p.o. twice daily for suppression but was not taking it.  For drug abuse patient had been put on methadone, but he appears not to have been on this medication.  During hospitalization, patient had developed critical illness myopathy and had been unable to walk.  During that hospital stay patient was noted to have hyperkalemia and SPEP  showed M-spike. Immunofixation showed serum immunofixation IgG Kappa spike.  Patient underwent bone marrow biopsy on 05/10/2022 and was positive for possible multiple myeloma.  He was scheduled to  follow-up with oncology in the outpatient setting.  His creatinine was around 2.0 at discharge.  After he was discharged from the hospital he went to skilled nursing facility since he was unable to take care of himself. Subsequently, patient had left the facility and went to live with his Aunt and was not taking any of his medications.  He had had a fall 3 days prior to presentation and started having fevers.    In the ED, patient was noted to be tachycardic, tachypneic with elevated blood pressure.  Labs showed mild hypokalemia with creatinine elevated at 2.3, calcium 11.3, CK 291, and lactic acid within normal limits.  CT scan of the head and cervical spine noted no acute abnormality with fusion at C6-T1.  CT scan of the abdomen pelvis noted large amount of stool throughout the colon, stable splenomegaly, and and chronic appearing fractures at L1/L2/ L4.  CT of the left knee noted anterior soft tissue swelling without acute fracture or dislocation and advanced osteoarthritis.  Blood cultures were obtained.  Patient had been bolused 2 L of normal saline IV fluid, potassium chloride 10 mEq IV, Phenergan, and morphine for pain.  Patient also tested positive for COVID-19 infection.  Patient was then admitted hospital for further evaluation and treatment.  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  CK  291.  --Continue gabapentin  --PT/OT recommend SNF placement, TOC for assistance --Continue therapy efforts while inpatient   SIRS Mildly febrile tachycardic tachypneic on presentation.  Patient with history  of MRSA bacteremia, ESBL E.  Klebsiella, and osteomyelitis/discitis.  Patient was supposed to be on doxycycline suppression but was not taking it.   --Blood cultures: No growth x 5 days --Urine culture less than 10,000 colonies of insignificant growth. --Continue Doxycycline 100 mg p.o. twice daily    COVID-19 viral infection Incidental finding with fever malaise.  No hypoxia.  Fever is now resolved.  Completed 5-day course of Molnupiravir. 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  Patient has been seen by oncology as outpatient who has an impression of monoclonal gammopathy versus the possibility of POEMS.  Plan was EMG studies.  Patient follows up with hematology at [facility], Dr. Madison Hickman.  Continue outpatient follow-up with recommendation of continued surveillance with labs every 3 months.   Mild acute kidney injury on chronic kidney disease stage stage IIIb Presenting creatinine at 2.3.  Baseline around 2.0..  Patient received IV hydration with improvement in creatinine level at 1.52 11/22.   Mild hypokalemia.   Repleted during hospitalization   Hypomagnesemia.  Repleted during hospitalization  History of osteomyelitis/discitis, MRSA bacteremia Continue doxycycline for chronic suppression.  Initial proposed end date was 07-17-22.   --Blood cultures x 2: No growth x 5 days --Continue doxycycline 100 mg p.o. every 12 hours  Constipation Noted on CT scan of the abdomen.   --Continue Senokot-S1 tablet p.o. twice daily --MiraLAX p.o. daily   Elevated LFTs/hyperbilirubinemia: Resolved AST 49, ALT 52, and total bilirubin 1.9.  LFTs and bilirubin now have normalized.   Normocytic anemia Hemoglobin stable, 10.8.   Depression --Continue Cymbalta   Chronic hepatitis C --Outpatient follow-up with infectious disease on discharge for consideration of treatment outpatient.   History of CVA history of ICH and IVH Continue aspirin   History of polysubstance abuse Patient with history of IV drug abuse including heroin and methamphetamines.  Urine drug screen positive for opiates and amphetamine at this time.  He was  supposed to be on methadone not taking it.  Patient was counseled about it.   Stage I coccyx pressure ulceration, POA Pressure Injury 06/03/22 Coccyx Mid Stage 1 -  Intact skin with non-blanchable redness of a localized area usually over a bony prominence. (Active)  06/03/22 1629  Location: Coccyx  Location Orientation: Mid  Staging: Stage 1 -  Intact skin with non-blanchable redness of a localized area usually over a bony prominence.  Wound Description (Comments):   Present on Admission: Yes  -- Wound RN consulted.  Continue local wound care, offloading  Insomnia -- Ambien 5 mg p.o. nightly   DVT prophylaxis: heparin injection 5,000 Units Start: 06/03/22 1400    Code Status: Full Code Family Communication: No family present at bedside this morning  Disposition Plan:  Level of care: Med-Surg Status is: Inpatient Remains inpatient appropriate because: Pending SNF placement    Consultants:  none  Procedures:  none  Antimicrobials:   Molnupiravir 11/19>>   Subjective: Patient seen examined bedside, resting comfortably.  Lying in bed watching TV.  No complaints, questions or concerns this morning.  Denies dizziness, no  fever, no chills/night sweats, no nausea/vomiting, no diarrhea, no chest pain, no palpitations, no abdominal pain, no cough/congestion.  No acute events overnight per nursing staff.  Medically stable for discharge to SNF once bed available, anticipate likely will need 10-day quarantine from initial diagnosis of COVID-19 viral infection.  Objective: Vitals:  06/11/22 1645 06/11/22 1927 06/12/22 0613 06/12/22 0914  BP: 128/82 (!) 146/98 115/75 (!) 105/94  Pulse: 99 (!) 104 89 92  Resp: _0 Temp: 99.2 F (37.3 C) 99 F (37.2 C) 98.2 F (36.8 C) 98.4 F (36.9 C)  TempSrc: Oral Oral Oral Oral  SpO2: 97% 99% 98% 100%  Weight:      Height:        Intake/Output Summary (Last 24 hours) at 06/12/2022 1035 Last data filed at 06/12/2022 0548 Gross  per 24 hour  Intake 1320 ml  Output 2600 ml  Net -1280 ml   Filed Weights   06/04/22 2038 06/05/22 2037 06/11/22 0557  Weight: 88.6 kg 86.9 kg 92 kg    Examination:  Physical Exam: GEN: NAD, alert and oriented x 3, chronically ill appearance, appears older than stated age HEENT: NCAT, PERRL, EOMI, sclera clear, MMM, poor dentition PULM: CTAB w/o wheezes/crackles, normal respiratory effort, on room air CV: RRR w/o M/G/R GI: abd soft, NTND, NABS, no R/G/M MSK: no peripheral edema, moves all extremities independently; decreased muscle tone NEURO: CN II-XII intact, no focal deficits PSYCH: normal mood/affect Integumentary: dry/intact, no rashes or wounds    Data Reviewed: I have personally reviewed following labs and imaging studies  CBC: Recent Labs  Lab 06/06/22 0422 06/11/22 0231  WBC 7.0 8.8  HGB 10.8* 9.3*  HCT 29.8* 27.7*  MCV 82.8 87.1  PLT 177 382   Basic Metabolic Panel: Recent Labs  Lab 06/06/22 0422 06/11/22 0231  NA 134* 139  K 3.7 4.0  CL 101 105  CO2 23 26  GLUCOSE 90 113*  BUN 21* 24*  CREATININE 1.52* 1.80*  CALCIUM 9.2 9.6  MG 1.9 1.7   GFR: Estimated Creatinine Clearance: 61.1 mL/min (A) (by C-G formula based on SCr of 1.8 mg/dL (H)). Liver Function Tests: No results for input(s): "AST", "ALT", "ALKPHOS", "BILITOT", "PROT", "ALBUMIN" in the last 168 hours.  No results for input(s): "LIPASE", "AMYLASE" in the last 168 hours.  No results for input(s): "AMMONIA" in the last 168 hours. Coagulation Profile: No results for input(s): "INR", "PROTIME" in the last 168 hours. Cardiac Enzymes: No results for input(s): "CKTOTAL", "CKMB", "CKMBINDEX", "TROPONINI" in the last 168 hours.  BNP (last 3 results) No results for input(s): "PROBNP" in the last 8760 hours. HbA1C: No results for input(s): "HGBA1C" in the last 72 hours. CBG: No results for input(s): "GLUCAP" in the last 168 hours. Lipid Profile: No results for input(s): "CHOL", "HDL",  "LDLCALC", "TRIG", "CHOLHDL", "LDLDIRECT" in the last 72 hours. Thyroid Function Tests: No results for input(s): "TSH", "T4TOTAL", "FREET4", "T3FREE", "THYROIDAB" in the last 72 hours. Anemia Panel: No results for input(s): "VITAMINB12", "FOLATE", "FERRITIN", "TIBC", "IRON", "RETICCTPCT" in the last 72 hours. Sepsis Labs: No results for input(s): "PROCALCITON", "LATICACIDVEN" in the last 168 hours.   Recent Results (from the past 240 hour(s))  Culture, blood (routine x 2)     Status: None   Collection Time: 06/03/22 12:23 AM   Specimen: BLOOD RIGHT ARM  Result Value Ref Range Status   Specimen Description BLOOD RIGHT ARM  Final   Special Requests   Final    BOTTLES DRAWN AEROBIC AND ANAEROBIC Blood Culture results may not be optimal due to an excessive volume of blood received in culture bottles   Culture   Final    NO GROWTH 5 DAYS Performed at [facility], [place] [address], [place], [place] [address]    Report Status  06/08/2022 FINAL  Final  Culture, blood (routine x 2)     Status: None   Collection Time: 06/03/22  4:56 AM   Specimen: BLOOD RIGHT HAND  Result Value Ref Range Status   Specimen Description BLOOD RIGHT HAND  Final   Special Requests   Final    BOTTLES DRAWN AEROBIC ONLY Blood Culture adequate volume   Culture   Final    NO GROWTH 5 DAYS Performed at [facility], [address] [place], [place] [address]    Report Status 06/08/2022 FINAL  Final  Resp Panel by RT-PCR (Flu A&B, Covid) Anterior Nasal Swab     Status: Abnormal   Collection Time: 06/03/22 12:44 PM   Specimen: Anterior Nasal Swab  Result Value Ref Range Status   SARS Coronavirus 2 by RT PCR POSITIVE (A) NEGATIVE Final    Comment: (NOTE) SARS-CoV-2 target nucleic acids are DETECTED.  The SARS-CoV-2 RNA is generally detectable in upper respiratory specimens during the acute phase of infection. Positive results are indicative of the presence of the identified virus, but do not rule out  bacterial infection or co-infection with other pathogens not detected by the test. Clinical correlation with patient history and other diagnostic information is necessary to determine patient infection status. The expected result is Negative.  Fact Sheet for Patients: EntrepreneurPulse.com.au  Fact Sheet for Healthcare Providers: IncredibleEmployment.be  This test is not yet approved or cleared by the [place] FDA and  has been authorized for detection and/or diagnosis of SARS-CoV-2 by FDA under an Emergency Use Authorization (EUA).  This EUA will remain in effect (meaning this test can be used) for the duration of  the COVID-19 declaration under Section 564(b)(1) of the A ct, 21 U.S.C. section 360bbb-3(b)(1), unless the authorization is terminated or revoked sooner.     Influenza A by PCR NEGATIVE NEGATIVE Final   Influenza B by PCR NEGATIVE NEGATIVE Final    Comment: (NOTE) The Xpert Xpress SARS-CoV-2/FLU/RSV plus assay is intended as an aid in the diagnosis of influenza from Nasopharyngeal swab specimens and should not be used as a sole basis for treatment. Nasal washings and aspirates are unacceptable for Xpert Xpress SARS-CoV-2/FLU/RSV testing.  Fact Sheet for Patients: EntrepreneurPulse.com.au  Fact Sheet for Healthcare Providers: IncredibleEmployment.be  This test is not yet approved or cleared by the [place] FDA and has been authorized for detection and/or diagnosis of SARS-CoV-2 by FDA under an Emergency Use Authorization (EUA). This EUA will remain in effect (meaning this test can be used) for the duration of the COVID-19 declaration under Section 564(b)(1) of the Act, 21 U.S.C. section 360bbb-3(b)(1), unless the authorization is terminated or revoked.  Performed at [facility], [place] [address] [place], [place] [address]   Urine Culture     Status: Abnormal   Collection  Time: 06/03/22  1:26 PM   Specimen: Urine, Catheterized  Result Value Ref Range Status   Specimen Description URINE, CATHETERIZED  Final   Special Requests NONE  Final   Culture (A)  Final    <10,000 COLONIES/mL INSIGNIFICANT GROWTH Performed at [facility], [place] [address], [place], [place] [address]    Report Status 06/04/2022 FINAL  Final         Radiology Studies: No results found.      Scheduled Meds:  vitamin C  500 mg Oral Daily   aspirin EC  81 mg Oral Daily   doxycycline  100 mg Oral Q12H   DULoxetine  40 mg Oral BID   gabapentin  300 mg Oral BID   heparin  5,000 Units Subcutaneous Q8H   lidocaine  1 patch Transdermal Q24H   magnesium oxide  400 mg Oral BID   polyethylene glycol  17 g Oral Daily   senna-docusate  1 tablet Oral BID   sodium chloride flush  3 mL Intravenous Q12H   zinc sulfate  220 mg Oral Daily   zolpidem  5 mg Oral QHS   Continuous Infusions:  promethazine (PHENERGAN) injection (IM or IVPB) 12.5 mg (06/08/22 2140)     LOS: 9 days    Time spent: 49 minutes spent on chart review, discussion with nursing staff, consultants, updating family and interview/physical exam; more than 50% of that time was spent in counseling and/or coordination of care.    Sashia Campas J [place] DO [facility] Available via Epic secure chat 7am-7pm After these hours, please refer to coverage provider listed on [email or web address] 06/12/2022, 10:35 AM

## 2022-06-12 NOTE — Progress Notes (Signed)
PT Cancellation Note  Patient Details Name: Dontavis Tschantz III MRN: [id number] DOB: 02-Aug-1980   Cancelled Treatment:    Reason Eval/Treat Not Completed: Patient declined, no reason specified  Patient states he sat up in the chair for a while today but has gotten back in bed with a stomach ache. Declines to work with PT at this time. Encouraged to get OOB with staff as able this evening. Candie Mile, PT, DPT Physical Therapist [facility] [place] 06/12/2022, 3:05 PM

## 2022-06-12 NOTE — Plan of Care (Signed)
  Problem: Respiratory: Goal: Will maintain a patent airway Outcome: Progressing Goal: Complications related to the disease process, condition or treatment will be avoided or minimized Outcome: Progressing   Problem: Education: Goal: Knowledge of General Education information will improve Description: Including pain rating scale, medication(s)/side effects and non-pharmacologic comfort measures Outcome: Progressing   Problem: Health Behavior/Discharge Planning: Goal: Ability to manage health-related needs will improve Outcome: Progressing   Problem: Clinical Measurements: Goal: Ability to maintain clinical measurements within normal limits will improve Outcome: Progressing Goal: Will remain free from infection Outcome: Progressing Goal: Diagnostic test results will improve Outcome: Progressing Goal: Respiratory complications will improve Outcome: Progressing Goal: Cardiovascular complication will be avoided Outcome: Progressing   Problem: Activity: Goal: Risk for activity intolerance will decrease Outcome: Progressing   Problem: Nutrition: Goal: Adequate nutrition will be maintained Outcome: Progressing   Problem: Coping: Goal: Level of anxiety will decrease Outcome: Progressing   Problem: Elimination: Goal: Will not experience complications related to bowel motility Outcome: Progressing Goal: Will not experience complications related to urinary retention Outcome: Progressing   Problem: Pain Managment: Goal: General experience of comfort will improve Outcome: Progressing   Problem: Safety: Goal: Ability to remain free from injury will improve Outcome: Progressing   Problem: Skin Integrity: Goal: Risk for impaired skin integrity will decrease Outcome: Progressing   

## 2022-06-12 NOTE — Progress Notes (Signed)
Pt. States he is "not feeling well", he feels like his heart is pounding out of his chest, HR 140's, yellow MEWS, diaphoretic, c/o some SOB, slight tremors noted bil. Hands, Blood glucose 127, MD aware, see new orders, will cont. To monitor  Anastasio Auerbach

## 2022-06-13 MED ORDER — LORATADINE 10 MG PO TABS: 10.0000 mg | ORAL_TABLET | Freq: Every day | ORAL | 0 refills | Status: DC

## 2022-06-13 MED ORDER — METFORMIN HCL 500 MG PO TABS: 500.0000 mg | ORAL_TABLET | Freq: Two times a day (BID) | ORAL | 0 refills | Status: DC

## 2022-06-13 MED ORDER — FLUVOXAMINE MALEATE 100 MG PO TABS: 100.0000 mg | ORAL_TABLET | Freq: Every day | ORAL | 0 refills | Status: DC

## 2022-06-13 MED ORDER — ASPIRIN 81 MG PO TBEC: 81.0000 mg | DELAYED_RELEASE_TABLET | Freq: Every day | ORAL | 0 refills | Status: DC

## 2022-06-13 MED ORDER — NOVOLOG 100 UNIT/ML ~~LOC~~ SOLN: SUBCUTANEOUS | 0 refills | Status: DC

## 2022-06-13 MED ORDER — PRAZOSIN HCL 2 MG PO CAPS: 2.0000 mg | ORAL_CAPSULE | Freq: Two times a day (BID) | ORAL | 0 refills | Status: DC

## 2022-06-13 MED ORDER — DOXEPIN HCL 25 MG PO CAPS: 25.0000 mg | ORAL_CAPSULE | Freq: Every day | ORAL | 0 refills | Status: DC

## 2022-06-13 MED ORDER — DOXYCYCLINE HYCLATE 100 MG PO TABS: 100.0000 mg | ORAL_TABLET | Freq: Two times a day (BID) | ORAL | 2 refills | Status: DC

## 2022-06-13 MED ORDER — INSULIN ASPART PROT & ASPART (70-30 MIX) 100 UNIT/ML ~~LOC~~ SUSP: 18.0000 [IU] | Freq: Two times a day (BID) | SUBCUTANEOUS | 0 refills | Status: DC

## 2022-06-13 MED ORDER — ALBUTEROL SULFATE HFA 108 (90 BASE) MCG/ACT IN AERS: 2.0000 | INHALATION_SPRAY | Freq: Four times a day (QID) | RESPIRATORY_TRACT | 2 refills | Status: DC | PRN

## 2022-06-13 MED ORDER — DIVALPROEX SODIUM 500 MG PO DR TAB: 500.0000 mg | DELAYED_RELEASE_TABLET | Freq: Two times a day (BID) | ORAL | 0 refills | Status: DC

## 2022-06-13 MED ORDER — LIDOCAINE 5 % EX PTCH: 1.0000 | MEDICATED_PATCH | CUTANEOUS | 0 refills | Status: DC

## 2022-06-13 MED ORDER — ARIPIPRAZOLE 20 MG PO TABS: 20.0000 mg | ORAL_TABLET | Freq: Every day | ORAL | 0 refills | Status: DC

## 2022-06-13 MED ORDER — LISINOPRIL 10 MG PO TABS: 10.0000 mg | ORAL_TABLET | Freq: Every day | ORAL | 0 refills | Status: DC

## 2022-06-13 MED ORDER — GABAPENTIN 300 MG PO CAPS: 300.0000 mg | ORAL_CAPSULE | Freq: Two times a day (BID) | ORAL | 0 refills | Status: DC

## 2022-06-13 MED ORDER — POLYETHYLENE GLYCOL 3350 17 G PO PACK: 17.0000 g | PACK | Freq: Every day | ORAL | 0 refills | Status: DC

## 2022-06-13 NOTE — Progress Notes (Signed)
PROGRESS NOTE    Ronald Hampton  GUR:[id number] DOB: 04-29-1981 DOA: 06/02/2022 PCP: Patient, No Pcp Per    Brief Narrative:  Ronald Hampton is a 41 y.o. male with past medical history significant for DM2, chronic hepatitis C,  prior back surgeries, bipolar disorder, PTSD, testicular cancer, polysubstance abuse including prior IV drug use (methamphetamine and heroin) presented to hospital after sustaining a fall out of the bed.    Recent hospitalization at [facility]  from 5/19-11/3 when patient had attempted suicide by heroin overdose which he relates to son dying of SIDS.  CT imaging showed acute cerebellar ICH with IVH.  Patient was found to be septic and intubated for airway protection.  MRI of the brain had noted small subacute infarcts in the bilateral cerebellar hemispheres and had septic emboli as he was found to have MRSA bacteremia.  No vegetations were noted on TEE although initial heart function was reduced 45-50%, but repeat echo 12/2021 noted EF to be 60-65%.  Patient had been put on broad-spectrum antibiotics and evaluated by ID.  Found to have C6/7, C7/T1, L2/L3, and L5/S1 discitis and osteomyelitis as well as paraspinal abscess.  He had completed IV antibiotics and repeat imaging noted improvement in discitis and osteomyelitis with no evidence of abscess on 04/01/22.  Infectious disease had recommended him to continue on doxycycline 100 mg p.o. twice daily for suppression but was not taking it.  For drug abuse patient had been put on methadone, but he appears not to have been on this medication.  During hospitalization, patient had developed critical illness myopathy and had been unable to walk.  During that hospital stay patient was noted to have hyperkalemia and SPEP  showed M-spike. Immunofixation showed serum immunofixation IgG Kappa spike.  Patient underwent bone marrow biopsy on 05/10/2022 and was positive for possible multiple myeloma.  He was scheduled to  follow-up with oncology in the outpatient setting.  His creatinine was around 2.0 at discharge.  After he was discharged from the hospital he went to skilled nursing facility since he was unable to take care of himself. Subsequently, patient had left the facility and went to live with his Aunt and was not taking any of his medications.  He had had a fall 3 days prior to presentation and started having fevers.    In the ED, patient was noted to be tachycardic, tachypneic with elevated blood pressure.  Labs showed mild hypokalemia with creatinine elevated at 2.3, calcium 11.3, CK 291, and lactic acid within normal limits.  CT scan of the head and cervical spine noted no acute abnormality with fusion at C6-T1.  CT scan of the abdomen pelvis noted large amount of stool throughout the colon, stable splenomegaly, and and chronic appearing fractures at L1/L2/ L4.  CT of the left knee noted anterior soft tissue swelling without acute fracture or dislocation and advanced osteoarthritis.  Blood cultures were obtained.  Patient had been bolused 2 L of normal saline IV fluid, potassium chloride 10 mEq IV, Phenergan, and morphine for pain.  Patient also tested positive for COVID-19 infection.  Patient was then admitted hospital for further evaluation and treatment.    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  TOC on board.   SIRS Mildly febrile tachycardic tachypneic on presentation.  Patient with history of MRSA bacteremia, ESBL E.  Klebsiella, and osteomyelitis/discitis.  Patient was supposed  to be on doxycycline suppression but was not taking it.  Blood cultures negative so far.  Urine culture less than 10,000 colony.  Continue doxycycline twice daily.  COVID-19 viral infection Incidental.  Completed molnupiravir. 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  Calcium level has improved at this time.  Patient has been seen by oncology as outpatient who has an impression of monoclonal gammopathy versus the possibility of POEMS.  Plan was EMG studies.  Patient follows up with hematology at [facility], Dr. Madison Hickman.  Continue outpatient follow-up with recommendation of continued surveillance with labs every 3 months.   Mild acute kidney injury on chronic kidney disease stage stage IIIb Presenting creatinine at 2.3.    Creatinine today at 1.6.  Mild hypokalemia.   Improved after replacement.  Latest potassium of 4.4.   Hypomagnesemia.  Improved after replacement.  Magnesium today at 1.7.  History of osteomyelitis/discitis, MRSA bacteremia Continue doxycycline twice daily for chronic suppression.  Initial proposed end date was 07-17-22.  Blood cultures have been negative.   Constipation Continue MiraLAX and stool softener.   Normocytic anemia Hemoglobin stable, patient hemoglobin of 10.8.   Depression Continue Cymbalta   Chronic hepatitis C/ elevated LFT. Outpatient follow-up with infectious disease after discharge for outpatient treatment plan 57 improved.  History of CVA history of ICH and IVH Continue aspirin   History of polysubstance abuse Urine drug screen positive for opiates and amphetamine.  Was supposed to be on methadone but was not taking it.  Nursing staff reported that he had some powder in his room today   Stage I coccyx pressure ulceration.  Present on admission.  Continue prevention protocol. Pressure Injury 06/03/22 Coccyx Mid Stage 1 -  Intact skin with non-blanchable redness of a localized area usually over a bony prominence. (Active)  06/03/22 1629  Location: Coccyx  Location Orientation: Mid  Staging: Stage 1 -  Intact skin with non-blanchable redness of a localized area usually over a bony prominence.  Wound Description  (Comments):   Present on Admission: Yes   Insomnia. Continue Ambien    DVT prophylaxis: heparin injection 5,000 Units Start: 06/03/22 1400   Code Status:     Code Status: Full Code  Disposition: Skilled nursing facility as per PT recommendation.  Status is: Inpatient  Remains inpatient appropriate because: Pending skilled nursing facility placement   Family Communication: None  Consultants:  None  Procedures:  None  Antimicrobials:  Doxycycline 06/03/2022> Molnupiravir 06/03/2022 to 06/08/22  Anti-infectives (From admission, onward)    Start     Dose/Rate Route Frequency Ordered Stop   06/03/22 2200  doxycycline (VIBRA-TABS) tablet 100 mg        100 mg Oral Every 12 hours 06/03/22 1551     06/03/22 1600  molnupiravir EUA (LAGEVRIO) capsule 800 mg        4 capsule Oral 2 times daily 06/03/22 1532 06/08/22 1122      Subjective: Today, patient was seen and examined at bedside.  Nursing staff reported that he had some powder in his room.  Security search was activated.  Patient declines using illicit substances in the room.  States that it was his brother who had brought it to the hospital.  Denies any nausea vomiting fever chills.  Objective: Vitals:   06/12/22 2137 06/13/22 0605 06/13/22 0627 06/13/22 0921  BP: (!) 128/90 (!) 141/91 (!) 131/92 (!) 148/107  Pulse: (!) 110 Marland Kitchen)  117 (!) 120 82  Resp: _0 Temp: 99 F (37.2 C) 98.9 F (37.2 C) 98.9 F (37.2 C)   TempSrc: Oral Oral Oral   SpO2: 100% 99% 100% (!) 82%  Weight:      Height:        Intake/Output Summary (Last 24 hours) at 06/13/2022 0938 Last data filed at 06/13/2022 0304 Gross per 24 hour  Intake 1080 ml  Output 2425 ml  Net -1345 ml   Filed Weights   06/04/22 2038 06/05/22 2037 06/11/22 0557  Weight: 88.6 kg 86.9 kg 92 kg    Physical Examination: Body mass index is 29.95 kg/m.  General:  Average built, not in obvious distress, appears chronically ill, older than stated  age, HENT:   No scleral pallor or icterus noted. Oral mucosa is moist.  Chest:  Clear breath sounds.  Diminished breath sounds bilaterally. No crackles or wheezes.  CVS: S1 &S2 heard. No murmur.  Regular rate and rhythm. Abdomen: Soft, nontender, nondistended.  Bowel sounds are heard.   Extremities: No cyanosis, clubbing or edema.  Peripheral pulses are palpable.  Decreased muscle tone. Psych: Alert, awake and oriented, normal mood CNS:  No cranial nerve deficits.  Moves extremitied.  Decreased muscle tone. Skin: Warm and dry.  No rashes noted.  Data Reviewed:   CBC: Recent Labs  Lab 06/11/22 0231 06/12/22 1923  WBC 8.8 7.8  HGB 9.3* 10.3*  HCT 27.7* 30.7*  MCV 87.1 88.0  PLT 277 248    Basic Metabolic Panel: Recent Labs  Lab 06/11/22 0231 06/12/22 1923  NA 139 138  K 4.0 4.4  CL 105 102  CO2 26 26  GLUCOSE 113* 122*  BUN 24* 24*  CREATININE 1.80* 1.63*  CALCIUM 9.6 9.5  MG 1.7  --     Liver Function Tests: Recent Labs  Lab 06/12/22 1923  AST 78*  ALT 71*  ALKPHOS 90  BILITOT 0.8  PROT 7.4  ALBUMIN 3.3*     Radiology Studies: DG CHEST PORT 1 VIEW  Result Date: 06/12/2022 CLINICAL DATA:  Shortness of breath EXAM: PORTABLE CHEST 1 VIEW COMPARISON:  06/03/2022 FINDINGS: The heart size and mediastinal contours are within normal limits. Both lungs are clear. The visualized skeletal structures are unremarkable. IMPRESSION: No active disease. Electronically Signed   By: Inez Catalina M.D.   On: 06/12/2022 19:34      LOS: 10 days     Flora Lipps, MD [facility] Available via Epic secure chat 7am-7pm After these hours, please refer to coverage provider listed on [email or web address] 06/13/2022, 9:38 AM

## 2022-06-13 NOTE — Progress Notes (Signed)
DISCHARGE NOTE HOME Pratik Dalziel [place] III to be discharged Home per MD order. Discussed prescriptions and follow up appointments with the patient. Prescriptions given to patient; medication list explained in detail. Patient verbalized understanding.  Skin clean, dry and intact without evidence of skin break down, no evidence of skin tears noted. IV catheter discontinued intact. Site without signs and symptoms of complications. Dressing and pressure applied. Pt denies pain at the site currently. No complaints noted.  Patient free of lines, drains, and wounds.   An After Visit Summary (AVS) was printed and given to the patient. Patient escorted via wheelchair, and discharged home via private auto.  [place] Emmitt S Rilley Stash, RN

## 2022-06-13 NOTE — TOC Transition Note (Signed)
Transition of Care [facility]) - CM/SW Discharge Note   Patient Details  Name: Ronald Hampton MRN: [id number] Date of Birth: 05-Jun-1981  Transition of Care [facility]) CM/SW Contact:  Tom-Johnson, Renea Ee, RN Phone Number: 06/13/2022, 4:39 PM   Clinical Narrative:     CM notified of patient's discharge today. Patient states his aunt, Ronald Hampton has sent him money through cash app to get [employer] [employer] transportation to destination. LCSW and CM had spoken with Ronald Hampton earlier and she states patient will not be returning to her home at this time. Requests TOC continues to f/u with SNF recommendation. No bed offers at this time. CM made patient aware of Susan's decision. Patient states he is a grown 41 yr old man and can make his own decisions.  Discharge orders are in and patient to call his cab . No further TOC needs noted.     Final next level of care: Other (comment) Barriers to Discharge: SNF Pending bed offer   Patient Goals and CMS Choice Patient states their goals for this hospitalization and ongoing recovery are:: To return home CMS Medicare.gov Compare Post Acute Care list provided to:: Patient    Discharge Placement                Patient to be transferred to facility by: Private Vehicle      Discharge Plan and Services                                     Social Determinants of Health (SDOH) Interventions     Readmission Risk Interventions     No data to display

## 2022-06-13 NOTE — Progress Notes (Signed)
Patients aunt Laqueta Carina (pharmacist here) visiting, asked nurse to come to room. While in room S. Shon Baton and patient questioned nurse about previous incident from earlier. Patients Aunt stated that powdered substance in baggies in floor beside of bed was not patients, but his brothers. Philip Aspen visitor stated" why did you ask patient if he was taking drugs and state he looked different than earlier. I tried to educate Manuela Schwartz that when I did my assessment patient was different from my morning assessment. Patient did state the baggies were his to me and told security how he takes and prepare the drugs, during earlier investigation. She was non receptive of the information cutting me off and aggressive with her tone and very argumentative. That is when AD Toula Moos) came in and spoke with the pt and the family.

## 2022-06-13 NOTE — TOC Progression Note (Signed)
Transition of Care [facility]) - Progression Note    Patient Details  Name: Ronald Hampton MRN: [id number] Date of Birth: [birth year]  Transition of [facility]) CM/SW Contact  Joanne Chars, LCSW Phone Number: 06/13/2022, 2:51 PM  Clinical Narrative:   CSW informed that pt aunt is in the room asking for update.  CSW spoke with pt who gave permission for CSW to speak with Simonne Come, [phone].  Pt still on isolation, CSW spoke with her in the hallway.  She asked for update on SNF bed search, still no bed offers.  She is also asking for assistance in determining if pt disability/medicaid/medicare is active.  Pt is supposed to be at the end of a 6 month waiting period and they are expecting it to all be in place shortly.  1445: CSW informed by RN that pt asking for DC, stating aunt is coming to pick him up.  CSW spoke with Aunt susan who says that this is not accurate, she is not going to pick him up.  Team informed.       Expected Discharge Plan: [place] Barriers to Discharge: Continued Medical Work up, Ship broker, SNF Pending bed offer  Expected Discharge Plan and Services Expected Discharge Plan: [place] arrangements for the past 2 months: Apartment Expected Discharge Date: 06/13/22                                     Social Determinants of Health (SDOH) Interventions    Readmission Risk Interventions     No data to display

## 2022-06-13 NOTE — Progress Notes (Signed)
Security completed search of room. In addition to two small bags collected a syringe and spoon were found and taken for disposal. Patient states he used items to cook down drugs.

## 2022-06-13 NOTE — Discharge Summary (Signed)
Physician Discharge Summary  Ronald Hampton ZOX:[id number] DOB: Jul 14, 1981 DOA: 06/02/2022  PCP: Patient, No Pcp Per  Admit date: 06/02/2022 Discharge date: 06/13/2022  Admitted From: Home  Discharge disposition: Home  Recommendations for Outpatient Follow-Up:   Follow up with your primary care provider in one week.  Check CBC, BMP, magnesium in the next visit Patient will need to continue antibiotics as outpatient until 07/17/2022. Recommend follow-up with [facility] as outpatient.  Discharge Diagnosis:   Principal Problem:   Fall Active Problems:   Critical illness myopathy   SIRS (systemic inflammatory response syndrome) (HCC)   COVID-19 virus infection   Hypercalcemia   Monoclonal gammopathy   Acute kidney injury superimposed on chronic kidney disease (HCC)   History of discitis   History of bacteremia   History of osteomyelitis   Constipation   Transaminitis   Hyperbilirubinemia   Normocytic anemia   Depression   Chronic hepatitis C (HCC)   History of CVA (cerebrovascular accident)   Polysubstance abuse (HCC)   Pressure injury of skin   Discharge Condition: Improved.  Diet recommendation: Regular.  Wound care: None.  Code status: Full.   History of Present Illness:   Ronald Hampton is a 41 y.o. male with past medical history significant for DM2, chronic hepatitis C,  prior back surgeries, bipolar disorder, PTSD, testicular cancer, polysubstance abuse including prior IV drug use (methamphetamine and heroin) presented to hospital after sustaining a fall out of the bed.    Recent hospitalization at [facility]  from 5/19-11/3 when patient had attempted suicide by heroin overdose which he relates to son dying of SIDS.  CT imaging showed acute cerebellar ICH with IVH.  Patient was found to be septic and intubated for airway protection.  MRI of the brain had noted small subacute infarcts in the bilateral cerebellar hemispheres and  had septic emboli as he was found to have MRSA bacteremia.  No vegetations were noted on TEE although initial heart function was reduced 45-50%, but repeat echo 12/2021 noted EF to be 60-65%.  Patient had been put on broad-spectrum antibiotics and evaluated by ID.  Found to have C6/7, C7/T1, L2/L3, and L5/S1 discitis and osteomyelitis as well as paraspinal abscess.  He had completed IV antibiotics and repeat imaging noted improvement in discitis and osteomyelitis with no evidence of abscess on 04/01/22.  Infectious disease had recommended him to continue on doxycycline 100 mg p.o. twice daily for suppression but was not taking it.  For drug abuse patient had been put on methadone, but he appears not to have been on this medication.  During hospitalization, patient had developed critical illness myopathy and had been unable to walk.  During that hospital stay patient was noted to have hyperkalemia and SPEP  showed M-spike. Immunofixation showed serum immunofixation IgG Kappa spike.  Patient underwent bone marrow biopsy on 05/10/2022 and was positive for possible multiple myeloma.  He was scheduled to follow-up with oncology in the outpatient setting.  His creatinine was around 2.0 at discharge.  After he was discharged from the hospital he went to skilled nursing facility since he was unable to take care of himself. Subsequently, patient had left the facility and went to live with his Aunt and was not taking any of his medications.  He had had a fall 3 days prior to presentation and started having fevers.    In the ED, patient was noted to be tachycardic, tachypneic with elevated blood pressure.  Labs showed mild  hypokalemia with creatinine elevated at 2.3, calcium 11.3, CK 291, and lactic acid within normal limits.  CT scan of the head and cervical spine noted no acute abnormality with fusion at C6-T1.  CT scan of the abdomen pelvis noted large amount of stool throughout the colon, stable splenomegaly, and and  chronic appearing fractures at L1/L2/ L4.  CT of the left knee noted anterior soft tissue swelling without acute fracture or dislocation and advanced osteoarthritis.  Blood cultures were obtained.  Patient had been bolused 2 L of normal saline IV fluid, potassium chloride 10 mEq IV, Phenergan, and morphine for pain.  Patient also tested positive for COVID-19 infection.  Patient was then admitted hospital for further evaluation and treatment.     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  Transition of care was on board but patient has decided to get discharge to home today.   SIRS Mildly febrile tachycardic tachypneic on presentation.  Patient with history of MRSA bacteremia, ESBL E.  Klebsiella, and osteomyelitis/discitis.  Patient was supposed to be on doxycycline suppression but was not taking it.  Blood cultures negative so far.  Urine culture less than 10,000 colony.  Continue doxycycline twice daily discharge.Marland Kitchen   COVID-19 viral infection Incidental.  Completed molnupiravir.  Was on airborne/contact isolation for 10 days from date of diagnosis (11/19)   Hypercalcemia/monoclonal gammopathy vs POEMS Calcium 11.3 on admission.  Calcium level has improved at this time.  Patient has been seen by oncology as outpatient who has an impression of monoclonal gammopathy versus the possibility of POEMS.  Plan was EMG studies.  Patient follows up with [facility] at [facility], Dr. Ephriam Jenkins.  Continue outpatient follow-up with recommendation of continued surveillance with labs every 3 months.   Mild acute kidney injury on chronic kidney disease stage stage IIIb Presenting creatinine at 2.3.    Creatinine today at 1.6.   Mild hypokalemia.   Improved after replacement.  Latest potassium of 4.4.    Hypomagnesemia.  Improved after replacement.    History of osteomyelitis/discitis, MRSA bacteremia Continue doxycycline twice daily for chronic suppression.  Initial proposed end date was 07-17-22.  Blood cultures have been negative.   Constipation Continue MiraLAX and stool softener.   Normocytic anemia Hemoglobin stable, patient hemoglobin of 10.3.   Depression Continue Cymbalta   Chronic hepatitis C/ elevated LFT. Outpatient follow-up with infectious disease after discharge for outpatient treatment plan .  LFTs have improved   History of CVA history of ICH and IVH Continue aspirin   History of polysubstance abuse Urine drug screen positive for opiates and amphetamine.  Was supposed to be on methadone but was not taking it.  Nursing staff reported that he had some powder in his room today   Stage I coccyx pressure ulceration.  Present on admission.  Continue prevention protocol. Pressure Injury 06/03/22 Coccyx Mid Stage 1 -  Intact skin with non-blanchable redness of a localized area usually over a bony prominence. (Active)  06/03/22 1629  Location: Coccyx  Location Orientation: Mid  Staging: Stage 1 -  Intact skin with non-blanchable redness of a localized area usually over a bony prominence.  Wound Description (Comments):   Present on Admission: Yes    Disposition.  At this time, patient is stable for disposition outpatient PCP follow-up  Medical Consultants:   None.  Procedures:  None Subjective:   Today, patient was seen and examined at bedside.  Wants to go home today.  Explained the need for rehabilitation but does not wish to stay in the hospital.  Discharge Exam:   Vitals:   06/13/22 0922 06/13/22 1018  BP:  (!) 129/98  Pulse:  (!) 125  Resp:    Temp:  98.8 F (37.1 C)  SpO2: 90% 90%   Vitals:   06/13/22 0627 06/13/22 0921 06/13/22 0922 06/13/22 1018  BP: (!) 131/92 (!) 148/107  (!) 129/98  Pulse: (!) 120 82  (!) 125  Resp: 16     Temp: 98.9  F (37.2 C)   98.8 F (37.1 C)  TempSrc: Oral     SpO2: 100% (!) 82% 90% 90%  Weight:      Height:        General: Alert awake, not in obvious distress HENT: pupils equally reacting to light,  No scleral pallor or icterus noted. Oral mucosa is moist.  His older than stated age, chronically ill Chest:  Clear breath sounds.  Diminished breath sounds bilaterally. No crackles or wheezes.  CVS: S1 &S2 heard. No murmur.  Regular rate and rhythm. Abdomen: Soft, nontender, nondistended.  Bowel sounds are heard.   Extremities: No cyanosis, clubbing or edema.  Peripheral pulses are palpable.  Decreased muscle tone. Psych: Alert, awake and oriented, normal mood CNS:  No cranial nerve deficits.  Moves all extremities.  Decreased muscle tone. Skin: Warm and dry.  No rashes noted.  The results of significant diagnostics from this hospitalization (including imaging, microbiology, ancillary and laboratory) are listed below for reference.     Diagnostic Studies:   CT Knee Left Wo Contrast  Result Date: 06/03/2022 CLINICAL DATA:  Knee trauma with occult injury suspected. EXAM: CT OF THE LEFT KNEE WITHOUT CONTRAST TECHNIQUE: Multidetector CT imaging of the left knee was performed according to the standard protocol. Multiplanar CT image reconstructions were also generated. RADIATION DOSE REDUCTION: This exam was performed according to the departmental dose-optimization program which includes automated exposure control, adjustment of the mA and/or kV according to patient size and/or use of iterative reconstruction technique. COMPARISON:  None Available. FINDINGS: Bones/Joint/Cartilage No acute fracture. Fragment at the medial patella which is corticated and chronic. Given location, question prior lateral transient patellar dislocation including on the prior radiograph. Generalized degenerative marginal spurring and joint space narrowing especially along the lateral facet of the patellofemoral compartment.  Subchondral lucency at the medial femoral condyle without discrete fracture line, also favored chronic. Trace knee joint fluid with regional anterior reticulation. Ligaments Suboptimally assessed by CT. Muscles and Tendons Intact extensor complex.  No major or muscular tear noted. Soft tissues Premature arterial calcification. IMPRESSION: 1. Anterior soft tissue swelling without fracture or dislocation. 2. 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 Electronically Signed   By: Tiburcio Pea M.D.   On: 06/03/2022 04:10   CT ABDOMEN PELVIS W CONTRAST  Result Date: 06/03/2022 CLINICAL DATA:  Sepsis. EXAM: CT ABDOMEN AND PELVIS WITH CONTRAST TECHNIQUE: Multidetector CT imaging of the abdomen and pelvis was performed using the standard protocol following bolus administration of intravenous contrast. RADIATION DOSE REDUCTION: This exam was performed according to the departmental dose-optimization program which includes automated exposure control, adjustment of the mA and/or kV according to patient size and/or use of iterative reconstruction technique. CONTRAST:  75mL OMNIPAQUE IOHEXOL 350 MG/ML SOLN COMPARISON:  CT abdomen and pelvis 05/23/2010 FINDINGS: Lower chest: No acute abnormality. Hepatobiliary: No focal  liver abnormality is seen. No gallstones, gallbladder wall thickening, or biliary dilatation. Pancreas: Unremarkable. No pancreatic ductal dilatation or surrounding inflammatory changes. Spleen: Moderately enlarged, unchanged. Adrenals/Urinary Tract: Right renal cortical cyst with peripheral calcifications again seen measuring 8 mm, unchanged. Otherwise, the kidneys, adrenal glands, and bladder are within normal limits. Stomach/Bowel: Stomach is within normal limits. Appendix appears normal. No evidence of bowel wall thickening, distention, or inflammatory changes. There is a large amount of stool throughout the entire colon. Vascular/Lymphatic: No significant vascular  findings are present. No enlarged abdominal or pelvic lymph nodes. Reproductive: Prostate is unremarkable. Other: No abdominal wall hernia or abnormality. No abdominopelvic ascites. Musculoskeletal: There is new vertebral body height loss and fusion at the L1-L2 vertebral bodies with kyphosis at this level. Findings are likely related to old injury. This is not seen in 2011. There is mild chronic appearing compression deformity of the superior endplate of L4 which is also new from 2011. 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 There are new degenerative changes at L5-S1. IMPRESSION: 1. No acute localizing process in the abdomen or pelvis. 2. Large amount of stool throughout the colon. 3. Stable moderate splenomegaly. 4. Chronic appearing fractures at L1, L2 and L4 appear new from 2011. Please correlate clinically. 5. 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 Electronically Signed   By: Darliss Cheney M.D.   On: 06/03/2022 01:49   CT Head Wo Contrast  Result Date: 06/03/2022 CLINICAL DATA:  Fall, paraplegic EXAM: CT HEAD WITHOUT CONTRAST CT CERVICAL SPINE WITHOUT CONTRAST TECHNIQUE: Multidetector CT imaging of the head and cervical spine was performed following the standard protocol without intravenous contrast. Multiplanar CT image reconstructions of the cervical spine were also generated. RADIATION DOSE REDUCTION: This exam was performed according to the departmental dose-optimization program which includes automated exposure control, adjustment of the mA and/or kV according to patient size and/or use of iterative reconstruction technique. COMPARISON:  MRI cervical spine dated 11/21/2020. CT head dated 11/21/2020. FINDINGS: CT HEAD FINDINGS Brain: No evidence of acute infarction, hemorrhage, hydrocephalus, extra-axial collection or mass lesion/mass effect. Vascular: No hyperdense vessel or unexpected calcification. Skull: Normal. Negative for fracture or  focal lesion. Sinuses/Orbits: The visualized paranasal sinuses are essentially clear. The mastoid air cells are unopacified. Other: None. CT CERVICAL SPINE FINDINGS Alignment: Reversal of the normal lower cervical lordosis. Skull base and vertebrae: No acute fracture. No primary bone lesion or focal pathologic process. C6-T1 fusion. Soft tissues and spinal canal: No prevertebral fluid or swelling. No visible canal hematoma. Disc levels: Degenerative changes at C6-T1. Spinal canal is patent. Upper chest: Visualized lung apices are clear. Other: Visualized thyroid is unremarkable. IMPRESSION: Normal head CT. No traumatic injury to the cervical spine. Degenerative changes with fusion at C6-T1. Electronically Signed   By: Charline Bills M.D.   On: 06/03/2022 01:46   CT Cervical Spine Wo Contrast  Result Date: 06/03/2022 CLINICAL DATA:  Fall, paraplegic EXAM: CT HEAD WITHOUT CONTRAST CT CERVICAL SPINE WITHOUT CONTRAST TECHNIQUE: Multidetector CT imaging of the head and cervical spine was performed following the standard protocol without intravenous contrast. Multiplanar CT image reconstructions of the cervical spine were also generated. RADIATION DOSE REDUCTION: This exam was performed according to the departmental dose-optimization program which includes automated exposure control, adjustment of the mA and/or kV according to patient size and/or use of iterative reconstruction technique. COMPARISON:  MRI cervical spine dated 11/21/2020. CT head dated 11/21/2020. FINDINGS: CT HEAD FINDINGS Brain: No evidence of acute infarction, hemorrhage,  hydrocephalus, extra-axial collection or mass lesion/mass effect. Vascular: No hyperdense vessel or unexpected calcification. Skull: Normal. Negative for fracture or focal lesion. Sinuses/Orbits: The visualized paranasal sinuses are essentially clear. The mastoid air cells are unopacified. Other: None. CT CERVICAL SPINE FINDINGS Alignment: Reversal of the normal lower cervical  lordosis. Skull base and vertebrae: No acute fracture. No primary bone lesion or focal pathologic process. C6-T1 fusion. Soft tissues and spinal canal: No prevertebral fluid or swelling. No visible canal hematoma. Disc levels: Degenerative changes at C6-T1. Spinal canal is patent. Upper chest: Visualized lung apices are clear. Other: Visualized thyroid is unremarkable. IMPRESSION: Normal head CT. No traumatic injury to the cervical spine. Degenerative changes with fusion at C6-T1. Electronically Signed   By: Charline Bills M.D.   On: 06/03/2022 01:46   DG Knee Complete 4 Views Right  Result Date: 06/03/2022 CLINICAL DATA:  Fever EXAM: RIGHT KNEE - COMPLETE 4+ VIEW COMPARISON:  None Available. FINDINGS: No evidence of fracture, dislocation, or joint effusion. No evidence of arthropathy or other focal bone abnormality. Soft tissues are unremarkable. IMPRESSION: Negative. Electronically Signed   By: Deatra Robinson M.D.   On: 06/03/2022 01:13   DG Knee Complete 4 Views Left  Result Date: 06/03/2022 CLINICAL DATA:  Fever EXAM: LEFT KNEE - COMPLETE 4+ VIEW COMPARISON:  None Available. FINDINGS: No acute fracture. There is lateral subluxation of the patella. Femorotibial joint space is normal. No knee effusion. IMPRESSION: Lateral subluxation of the left patella.  No acute fracture. Electronically Signed   By: Deatra Robinson M.D.   On: 06/03/2022 01:11   DG Chest Portable 1 View  Result Date: 06/03/2022 CLINICAL DATA:  Fever EXAM: PORTABLE CHEST 1 VIEW COMPARISON:  None Available. FINDINGS: The heart size and mediastinal contours are within normal limits. Both lungs are clear. The visualized skeletal structures are unremarkable. IMPRESSION: No active disease. Electronically Signed   By: Deatra Robinson M.D.   On: 06/03/2022 01:09     Labs:   Basic Metabolic Panel: Recent Labs  Lab 06/11/22 0231 06/12/22 1923  NA 139 138  K 4.0 4.4  CL 105 102  CO2 26 26  GLUCOSE 113* 122*  BUN 24* 24*   CREATININE 1.80* 1.63*  CALCIUM 9.6 9.5  MG 1.7  --    GFR Estimated Creatinine Clearance: 67.5 mL/min (A) (by C-G formula based on SCr of 1.63 mg/dL (H)). Liver Function Tests: Recent Labs  Lab 06/12/22 1923  AST 78*  ALT 71*  ALKPHOS 90  BILITOT 0.8  PROT 7.4  ALBUMIN 3.3*   No results for input(s): "LIPASE", "AMYLASE" in the last 168 hours. No results for input(s): "AMMONIA" in the last 168 hours. Coagulation profile No results for input(s): "INR", "PROTIME" in the last 168 hours.  CBC: Recent Labs  Lab 06/11/22 0231 06/12/22 1923  WBC 8.8 7.8  HGB 9.3* 10.3*  HCT 27.7* 30.7*  MCV 87.1 88.0  PLT 277 324   Cardiac Enzymes: No results for input(s): "CKTOTAL", "CKMB", "CKMBINDEX", "TROPONINI" in the last 168 hours. BNP: Invalid input(s): "POCBNP" CBG: Recent Labs  Lab 06/12/22 1842  GLUCAP 127*   D-Dimer Recent Labs    06/12/22 1923  DDIMER 1.26*   Hgb A1c No results for input(s): "HGBA1C" in the last 72 hours. Lipid Profile No results for input(s): "CHOL", "HDL", "LDLCALC", "TRIG", "CHOLHDL", "LDLDIRECT" in the last 72 hours. Thyroid function studies No results for input(s): "TSH", "T4TOTAL", "T3FREE", "THYROIDAB" in the last 72 hours.  Invalid input(s): "FREET3" Anemia work  up No results for input(s): "VITAMINB12", "FOLATE", "FERRITIN", "TIBC", "IRON", "RETICCTPCT" in the last 72 hours. Microbiology No results found for this or any previous visit (from the past 240 hour(s)).   Discharge Instructions:   Discharge Instructions     Diet general   Complete by: As directed    Discharge instructions   Complete by: As directed    Follow-up with your primary care physician in 1 week.  Check blood work at that time.  Continue doxycycline for infection prevention.  Seek medical attention for worsening symptoms.  Follow-up with Hemeoncology at the [facility] as outpatient in 3 months.   Increase activity slowly   Complete by: As directed    No  wound care   Complete by: As directed       Allergies as of 06/13/2022       Reactions   Sulfa Antibiotics Anaphylaxis   Trazodone And Nefazodone    Zofran [ondansetron Hcl] Rash        Medication List     STOP taking these medications    hydrOXYzine 50 MG tablet Commonly known as: ATARAX   zolpidem 10 MG tablet Commonly known as: AMBIEN       TAKE these medications    albuterol 108 (90 Base) MCG/ACT inhaler Commonly known as: VENTOLIN HFA Inhale 2 puffs into the lungs every 6 (six) hours as needed for wheezing or shortness of breath.   ARIPiprazole ER 400 MG Srer injection Commonly known as: ABILIFY MAINTENA Inject 2 mLs (400 mg total) into the muscle every 28 (twenty-eight) days.   ARIPiprazole 20 MG tablet Commonly known as: ABILIFY Take 1 tablet (20 mg total) by mouth daily. For mood control   aspirin EC 81 MG tablet Take 1 tablet (81 mg total) by mouth daily. Swallow whole. Start taking on: June 14, 2022   blood glucose meter kit and supplies Kit Dispense based on patient and insurance preference. Use up to four times daily as directed. (FOR ICD-9 250.00, 250.01). (   divalproex 500 MG DR tablet Commonly known as: DEPAKOTE Take 1 tablet (500 mg total) by mouth every 12 (twelve) hours. For mood stabilization   doxepin 25 MG capsule Commonly known as: SINEQUAN Take 1 capsule (25 mg total) by mouth at bedtime. For insomnia   doxycycline 100 MG tablet Commonly known as: VIBRA-TABS Take 1 tablet (100 mg total) by mouth every 12 (twelve) hours.   fluvoxaMINE 100 MG tablet Commonly known as: LUVOX Take 1 tablet (100 mg total) by mouth at bedtime. For depression/OCD   gabapentin 300 MG capsule Commonly known as: NEURONTIN Take 1 capsule (300 mg total) by mouth 2 (two) times daily.   insulin aspart protamine- aspart (70-30) 100 UNIT/ML injection Commonly known as: NOVOLOG MIX 70/30 Inject 0.18 mLs (18 Units total) into the skin 2 (two) times  daily with a meal. For diabetes management   lidocaine 5 % Commonly known as: LIDODERM Place 1 patch onto the skin daily. Remove & Discard patch within 12 hours or as directed by MD   lisinopril 10 MG tablet Commonly known as: ZESTRIL Take 1 tablet (10 mg total) by mouth daily. For high blood pressure   loratadine 10 MG tablet Commonly known as: CLARITIN Take 1 tablet (10 mg total) by mouth daily. (May purchase from over the counter): For allergies   metFORMIN 500 MG tablet Commonly known as: GLUCOPHAGE Take 1 tablet (500 mg total) by mouth 2 (two) times daily with a meal. For diabetes  management   NovoLOG 100 UNIT/ML injection Generic drug: insulin aspart Take 4 units three times a day before meals: For diabetes management   polyethylene glycol 17 g packet Commonly known as: MIRALAX / GLYCOLAX Take 17 g by mouth daily. Start taking on: June 14, 2022   prazosin 2 MG capsule Commonly known as: MINIPRESS Take 1 capsule (2 mg total) by mouth 2 (two) times daily at 8 am and 10 pm. For PTSD related nightmares          Time coordinating discharge: 39 minutes  Signed:  Ansar Skoda  [facility] 06/13/2022, 6:36 PM

## 2022-06-13 NOTE — Progress Notes (Signed)
Occupational Therapy Treatment Patient Details Name: Ronald Hampton MRN: [id number] DOB: 12-Feb-1981 Today's Date: 06/13/2022   History of present illness Pt is a 41 y/o male who presented after a fall out of bed at home. Pt admitted for SIRS, incidentally COVID+, and possible AKI. Pt with recent hospitalization at [facility] (May-Oct 2023) after attempted suicide by overdose resulting in multiple complications including cerebellar ICH, intubation, small cerebellar infarcts, and lumbar discitis and osteomyelitis as well as paraspinal abscess w/ IV antibiotics. PMH: bipolar 1 disorder, depression, PTSD, functional paraplegia   OT comments  Pt pleasant, agreeable to participate though internally distracted by incident earlier this AM. Pt's aunt present initially with open discussion of DME needs, rehab trajectory and progress potential. Provided space for active listening with pt frustrations during session. Hope to progress transfers and facilitate wheelchair training in room as appropriate to maximize ADL/OOB participation.    Recommendations for follow up therapy are one component of a multi-disciplinary discharge planning process, led by the attending physician.  Recommendations may be updated based on patient status, additional functional criteria and insurance authorization.    Follow Up Recommendations  Skilled nursing-short term rehab (<3 hours/day)     Assistance Recommended at Discharge Frequent or constant Supervision/Assistance  Patient can return home with the following  A little help with walking and/or transfers;A lot of help with bathing/dressing/bathroom;Assistance with cooking/housework;Assist for transportation;Help with stairs or ramp for entrance   Equipment Recommendations  Wheelchair (measurements OT);Wheelchair cushion (measurements OT);BSC/3in1;Other (comment) (sliding board)    Recommendations for Other Services      Precautions / Restrictions  Precautions Precautions: Fall Restrictions Weight Bearing Restrictions: No       Mobility Bed Mobility                    Transfers                         Balance                                           ADL either performed or assessed with clinical judgement   ADL Overall ADL's : Needs assistance/impaired     Grooming: Set up;Sitting;Wash/dry face                                 General ADL Comments: Pt anxious regarding earlier incident this AM. Aunt initially present, discussed DME needs, rehab trajectory and provided space for active listening. Reinforced UE HEP with theraband with pt reporting familiarity. Encouraged pt to consider personal goals, compliance with healthcare team's advice. pt requesting retrieval of clothing from closet - declined assist needed for LB dressing, also reports able to remove condom cath if needed.    Extremity/Trunk Assessment Upper Extremity Assessment Upper Extremity Assessment: Overall WFL for tasks assessed   Lower Extremity Assessment Lower Extremity Assessment: Defer to PT evaluation        Vision   Vision Assessment?: No apparent visual deficits   Perception     Praxis      Cognition Arousal/Alertness: Awake/alert Behavior During Therapy: Flat affect, Anxious Overall Cognitive Status: Within Functional Limits for tasks assessed  General Comments: pt anxious and tearful regarding an earlier incident today, provided space for active listening        Exercises      Shoulder Instructions       General Comments      Pertinent Vitals/ Pain       Pain Assessment Pain Assessment: No/denies pain  Home Living                                          Prior Functioning/Environment              Frequency  Min 2X/week        Progress Toward Goals  OT Goals(current goals can now be found in  the care plan section)  Progress towards OT goals: OT to reassess next treatment  Acute Rehab OT Goals Patient Stated Goal: get better, transfer to another hospital OT Goal Formulation: With patient Time For Goal Achievement: 06/18/22 Potential to Achieve Goals: Good ADL Goals Pt Will Perform Lower Body Bathing: with supervision;sitting/lateral leans;bed level;with adaptive equipment Pt Will Perform Lower Body Dressing: with supervision;sitting/lateral leans;bed level;with adaptive equipment Pt Will Transfer to Toilet: with supervision;with transfer board;anterior/posterior transfer;bedside commode Pt/caregiver will Perform Home Exercise Program: Increased strength;Both right and left upper extremity;With theraband;Independently;With written HEP provided  Plan Discharge plan remains appropriate    Co-evaluation                 AM-PAC OT "6 Clicks" Daily Activity     Outcome Measure   Help from another person eating meals?: None Help from another person taking care of personal grooming?: A Little Help from another person toileting, which includes using toliet, bedpan, or urinal?: A Lot Help from another person bathing (including washing, rinsing, drying)?: A Lot Help from another person to put on and taking off regular upper body clothing?: None Help from another person to put on and taking off regular lower body clothing?: A Lot 6 Click Score: 17    End of Session    OT Visit Diagnosis: Other abnormalities of gait and mobility (R26.89);Muscle weakness (generalized) (M62.81)   Activity Tolerance Other (comment) (limited by anxiety)   Patient Left in bed;with call bell/phone within reach   Nurse Communication Mobility status        Time: 1350-1420 OT Time Calculation (min): 30 min  Charges: OT General Charges $OT Visit: 1 Visit OT Treatments $Therapeutic Activity: 23-37 mins  Malachy Chamber, OTR/L Acute Rehab Services Office: [phone]   Layla Maw 06/13/2022, 2:36 PM

## 2022-06-13 NOTE — Progress Notes (Signed)
Patient in room states he is not feeling well, diaphoretic and nauseated. Two small baggies with powder found on left side of bed in the floor. Vitals obtained. Security notified. MD notified.

## 2023-02-09 ENCOUNTER — Encounter (HOSPITAL_COMMUNITY): Payer: Self-pay | Admitting: Emergency Medicine

## 2023-02-09 ENCOUNTER — Other Ambulatory Visit: Payer: Self-pay

## 2023-02-09 ENCOUNTER — Emergency Department (HOSPITAL_COMMUNITY): Payer: No Typology Code available for payment source

## 2023-02-09 ENCOUNTER — Inpatient Hospital Stay (HOSPITAL_COMMUNITY)
Admission: EM | Admit: 2023-02-09 | Discharge: 2023-02-17 | DRG: 872 | Disposition: A | Discharge status: Home Health | Payer: No Typology Code available for payment source | Attending: Family Medicine | Admitting: Family Medicine

## 2023-02-09 DIAGNOSIS — Z1152 Encounter for screening for COVID-19: Secondary | ICD-10-CM

## 2023-02-09 DIAGNOSIS — Z7984 Long term (current) use of oral hypoglycemic drugs: Secondary | ICD-10-CM

## 2023-02-09 DIAGNOSIS — L739 Follicular disorder, unspecified: Secondary | ICD-10-CM | POA: Diagnosis present

## 2023-02-09 DIAGNOSIS — F315 Bipolar disorder, current episode depressed, severe, with psychotic features: Secondary | ICD-10-CM | POA: Diagnosis present

## 2023-02-09 DIAGNOSIS — Z882 Allergy status to sulfonamides status: Secondary | ICD-10-CM

## 2023-02-09 DIAGNOSIS — R21 Rash and other nonspecific skin eruption: Secondary | ICD-10-CM | POA: Diagnosis present

## 2023-02-09 DIAGNOSIS — R6510 Systemic inflammatory response syndrome (SIRS) of non-infectious origin without acute organ dysfunction: Secondary | ICD-10-CM | POA: Diagnosis present

## 2023-02-09 DIAGNOSIS — M8580 Other specified disorders of bone density and structure, unspecified site: Secondary | ICD-10-CM | POA: Diagnosis present

## 2023-02-09 DIAGNOSIS — F32A Depression, unspecified: Secondary | ICD-10-CM | POA: Diagnosis present

## 2023-02-09 DIAGNOSIS — S32512A Fracture of superior rim of left pubis, initial encounter for closed fracture: Secondary | ICD-10-CM | POA: Diagnosis present

## 2023-02-09 DIAGNOSIS — M7989 Other specified soft tissue disorders: Secondary | ICD-10-CM | POA: Diagnosis not present

## 2023-02-09 DIAGNOSIS — A4101 Sepsis due to Methicillin susceptible Staphylococcus aureus: Principal | ICD-10-CM | POA: Diagnosis present

## 2023-02-09 DIAGNOSIS — E871 Hypo-osmolality and hyponatremia: Secondary | ICD-10-CM | POA: Diagnosis present

## 2023-02-09 DIAGNOSIS — A419 Sepsis, unspecified organism: Principal | ICD-10-CM

## 2023-02-09 DIAGNOSIS — F429 Obsessive-compulsive disorder, unspecified: Secondary | ICD-10-CM | POA: Diagnosis present

## 2023-02-09 DIAGNOSIS — Z8619 Personal history of other infectious and parasitic diseases: Secondary | ICD-10-CM

## 2023-02-09 DIAGNOSIS — F444 Conversion disorder with motor symptom or deficit: Secondary | ICD-10-CM | POA: Diagnosis present

## 2023-02-09 DIAGNOSIS — Z87898 Personal history of other specified conditions: Secondary | ICD-10-CM

## 2023-02-09 DIAGNOSIS — L03115 Cellulitis of right lower limb: Secondary | ICD-10-CM | POA: Diagnosis present

## 2023-02-09 DIAGNOSIS — Z8739 Personal history of other diseases of the musculoskeletal system and connective tissue: Secondary | ICD-10-CM

## 2023-02-09 DIAGNOSIS — I10 Essential (primary) hypertension: Secondary | ICD-10-CM | POA: Diagnosis present

## 2023-02-09 DIAGNOSIS — L03116 Cellulitis of left lower limb: Secondary | ICD-10-CM | POA: Diagnosis present

## 2023-02-09 DIAGNOSIS — E876 Hypokalemia: Secondary | ICD-10-CM | POA: Diagnosis present

## 2023-02-09 DIAGNOSIS — F1910 Other psychoactive substance abuse, uncomplicated: Secondary | ICD-10-CM | POA: Diagnosis present

## 2023-02-09 DIAGNOSIS — G8929 Other chronic pain: Secondary | ICD-10-CM | POA: Diagnosis present

## 2023-02-09 DIAGNOSIS — W19XXXA Unspecified fall, initial encounter: Secondary | ICD-10-CM | POA: Diagnosis present

## 2023-02-09 DIAGNOSIS — Z993 Dependence on wheelchair: Secondary | ICD-10-CM

## 2023-02-09 DIAGNOSIS — Z8673 Personal history of transient ischemic attack (TIA), and cerebral infarction without residual deficits: Secondary | ICD-10-CM

## 2023-02-09 DIAGNOSIS — L03119 Cellulitis of unspecified part of limb: Secondary | ICD-10-CM | POA: Insufficient documentation

## 2023-02-09 DIAGNOSIS — N1831 Chronic kidney disease, stage 3a: Secondary | ICD-10-CM | POA: Diagnosis present

## 2023-02-09 DIAGNOSIS — M8448XA Pathological fracture, other site, initial encounter for fracture: Secondary | ICD-10-CM | POA: Diagnosis present

## 2023-02-09 DIAGNOSIS — Z7982 Long term (current) use of aspirin: Secondary | ICD-10-CM

## 2023-02-09 DIAGNOSIS — G4700 Insomnia, unspecified: Secondary | ICD-10-CM | POA: Diagnosis present

## 2023-02-09 DIAGNOSIS — E1122 Type 2 diabetes mellitus with diabetic chronic kidney disease: Secondary | ICD-10-CM | POA: Diagnosis present

## 2023-02-09 DIAGNOSIS — M545 Low back pain, unspecified: Secondary | ICD-10-CM | POA: Diagnosis present

## 2023-02-09 DIAGNOSIS — R531 Weakness: Secondary | ICD-10-CM | POA: Diagnosis present

## 2023-02-09 DIAGNOSIS — Z794 Long term (current) use of insulin: Secondary | ICD-10-CM

## 2023-02-09 DIAGNOSIS — F4310 Post-traumatic stress disorder, unspecified: Secondary | ICD-10-CM | POA: Diagnosis present

## 2023-02-09 DIAGNOSIS — Z79899 Other long term (current) drug therapy: Secondary | ICD-10-CM

## 2023-02-09 DIAGNOSIS — R296 Repeated falls: Secondary | ICD-10-CM | POA: Diagnosis present

## 2023-02-09 DIAGNOSIS — Z888 Allergy status to other drugs, medicaments and biological substances status: Secondary | ICD-10-CM

## 2023-02-09 DIAGNOSIS — S32511A Fracture of superior rim of right pubis, initial encounter for closed fracture: Secondary | ICD-10-CM | POA: Diagnosis present

## 2023-02-09 DIAGNOSIS — R6520 Severe sepsis without septic shock: Secondary | ICD-10-CM | POA: Diagnosis present

## 2023-02-09 DIAGNOSIS — E872 Acidosis, unspecified: Secondary | ICD-10-CM | POA: Diagnosis present

## 2023-02-09 DIAGNOSIS — K5900 Constipation, unspecified: Secondary | ICD-10-CM | POA: Diagnosis present

## 2023-02-09 DIAGNOSIS — C9000 Multiple myeloma not having achieved remission: Secondary | ICD-10-CM | POA: Diagnosis present

## 2023-02-09 DIAGNOSIS — I129 Hypertensive chronic kidney disease with stage 1 through stage 4 chronic kidney disease, or unspecified chronic kidney disease: Secondary | ICD-10-CM | POA: Diagnosis present

## 2023-02-09 LAB — CBC WITH DIFFERENTIAL/PLATELET
Abs Immature Granulocytes: 0.05 10*3/uL (ref 0.00–0.07)
Basophils Absolute: 0.1 10*3/uL (ref 0.0–0.1)
Basophils Relative: 1 %
Eosinophils Absolute: 0.1 10*3/uL (ref 0.0–0.5)
Eosinophils Relative: 1 %
HCT: 36.5 % — ABNORMAL LOW (ref 39.0–52.0)
Hemoglobin: 12.4 g/dL — ABNORMAL LOW (ref 13.0–17.0)
Immature Granulocytes: 1 %
Lymphocytes Relative: 15 %
Lymphs Abs: 1.6 10*3/uL (ref 0.7–4.0)
MCH: 29.7 pg (ref 26.0–34.0)
MCHC: 34 g/dL (ref 30.0–36.0)
MCV: 87.3 fL (ref 80.0–100.0)
Monocytes Absolute: 1.4 10*3/uL — ABNORMAL HIGH (ref 0.1–1.0)
Monocytes Relative: 13 %
Neutro Abs: 7.8 10*3/uL — ABNORMAL HIGH (ref 1.7–7.7)
Neutrophils Relative %: 69 %
Platelets: 305 10*3/uL (ref 150–400)
RBC: 4.18 MIL/uL — ABNORMAL LOW (ref 4.22–5.81)
RDW: 13.1 % (ref 11.5–15.5)
WBC: 10.9 10*3/uL — ABNORMAL HIGH (ref 4.0–10.5)
nRBC: 0 % (ref 0.0–0.2)

## 2023-02-09 LAB — COMPREHENSIVE METABOLIC PANEL
ALT: 14 U/L (ref 0–44)
AST: 19 U/L (ref 15–41)
Albumin: 3.5 g/dL (ref 3.5–5.0)
Alkaline Phosphatase: 199 U/L — ABNORMAL HIGH (ref 38–126)
Anion gap: 12 (ref 5–15)
BUN: 18 mg/dL (ref 6–20)
CO2: 21 mmol/L — ABNORMAL LOW (ref 22–32)
Calcium: 9.2 mg/dL (ref 8.9–10.3)
Chloride: 96 mmol/L — ABNORMAL LOW (ref 98–111)
Creatinine, Ser: 1.7 mg/dL — ABNORMAL HIGH (ref 0.61–1.24)
GFR, Estimated: 52 mL/min — ABNORMAL LOW (ref >60)
Glucose, Bld: 169 mg/dL — ABNORMAL HIGH (ref 70–99)
Potassium: 4 mmol/L (ref 3.5–5.1)
Sodium: 129 mmol/L — ABNORMAL LOW (ref 135–145)
Total Bilirubin: 0.8 mg/dL (ref 0.3–1.2)
Total Protein: 8.1 g/dL (ref 6.5–8.1)

## 2023-02-09 LAB — BRAIN NATRIURETIC PEPTIDE: B Natriuretic Peptide: 13.3 pg/mL (ref 0.0–100.0)

## 2023-02-09 LAB — PROTIME-INR
INR: 1.3 — ABNORMAL HIGH (ref 0.8–1.2)
Prothrombin Time: 16.1 seconds — ABNORMAL HIGH (ref 11.4–15.2)

## 2023-02-09 LAB — I-STAT CG4 LACTIC ACID, ED: Lactic Acid, Venous: 2.3 mmol/L (ref 0.5–1.9)

## 2023-02-09 MED ORDER — HYDROMORPHONE HCL 1 MG/ML IJ SOLN
1.0000 mg | Freq: Once | INTRAMUSCULAR | Status: AC
  Administered 2023-02-09: 1 mg via INTRAVENOUS
  Filled 2023-02-09: qty 1

## 2023-02-09 MED ORDER — VANCOMYCIN HCL IN DEXTROSE 1-5 GM/200ML-% IV SOLN: 1000.0000 mg | Freq: Once | INTRAVENOUS | Status: DC

## 2023-02-09 MED ORDER — ACETAMINOPHEN 500 MG PO TABS
1000.0000 mg | ORAL_TABLET | Freq: Once | ORAL | Status: AC
  Administered 2023-02-09: 1000 mg via ORAL
  Filled 2023-02-09: qty 2

## 2023-02-09 MED ORDER — HYDROMORPHONE HCL 1 MG/ML IJ SOLN
0.5000 mg | Freq: Once | INTRAMUSCULAR | Status: AC
  Administered 2023-02-09: 0.5 mg via INTRAVENOUS
  Filled 2023-02-09: qty 1

## 2023-02-09 MED ORDER — METRONIDAZOLE 500 MG/100ML IV SOLN
500.0000 mg | Freq: Once | INTRAVENOUS | Status: AC
  Administered 2023-02-09: 500 mg via INTRAVENOUS
  Filled 2023-02-09: qty 100

## 2023-02-09 MED ORDER — LACTATED RINGERS IV BOLUS
1000.0000 mL | Freq: Once | INTRAVENOUS | Status: AC
  Administered 2023-02-09: 1000 mL via INTRAVENOUS

## 2023-02-09 MED ORDER — LACTATED RINGERS IV BOLUS (SEPSIS)
1000.0000 mL | Freq: Once | INTRAVENOUS | Status: AC
  Administered 2023-02-09: 1000 mL via INTRAVENOUS

## 2023-02-09 MED ORDER — VANCOMYCIN HCL 2000 MG/400ML IV SOLN
2000.0000 mg | Freq: Once | INTRAVENOUS | Status: AC
  Administered 2023-02-10: 2000 mg via INTRAVENOUS
  Filled 2023-02-09: qty 400

## 2023-02-09 MED ORDER — SODIUM CHLORIDE 0.9 % IV SOLN
2.0000 g | Freq: Once | INTRAVENOUS | Status: AC
  Administered 2023-02-09: 2 g via INTRAVENOUS
  Filled 2023-02-09: qty 12.5

## 2023-02-09 NOTE — ED Notes (Signed)
The pt was brought back from c-t unablle to get his c-t

## 2023-02-09 NOTE — ED Notes (Signed)
The pt has been taken to xray 

## 2023-02-09 NOTE — ED Provider Notes (Signed)
[place] EMERGENCY DEPARTMENT AT [facility] Provider Note   CSN: [id number] Arrival date & time: 02/09/23  2023     History  Chief Complaint  Patient presents with   Weakness    Ronald Hampton is a 42 y.o. male.  HPI 42 year old male presents with fever, vomiting, and buttock/leg pain.  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  He was recently in jail and got out about a week month ago.  He states he did try and walk recently and was able to do a little bit but then started to fall.  He has been having leg swelling and leg redness over the last several days.  He had a fever over the last 3 days.  He has been vomiting for about a week, unable to take in p.o. fluids or food.  His left buttocks hurts like it feels like there is a mass there but he cannot feel anything.  No cough or shortness of breath.  No abdominal pain.  He has what appears to be folliculitis and he states that is a recurring issue.  He does have back pain but states is not different than the back pain he has been left with since the previous illness.  He did inject meth a couple times within the last couple weeks.  Home Medications Prior to Admission medications   Medication Sig Start Date End Date Taking? Authorizing Provider  albuterol (VENTOLIN HFA) 108 (90 Base) MCG/ACT inhaler Inhale 2 puffs into the lungs every 6 (six) hours as needed for wheezing or shortness of breath. 06/13/22   Pokhrel, Rebekah Chesterfield, MD  ARIPiprazole (ABILIFY) 20 MG tablet Take 1 tablet (20 mg total) by mouth daily. For mood control 06/13/22   Pokhrel, Rebekah Chesterfield, MD  ARIPiprazole ER (ABILIFY MAINTENA) 400 MG SRER injection Inject 2 mLs (400 mg total) into the muscle every 28 (twenty-eight) days. Patient not taking: Reported on 06/03/2022 11/21/17   Armandina Stammer I, NP  aspirin EC 81 MG tablet Take 1 tablet (81 mg total) by mouth daily.  Swallow whole. 06/14/22   Pokhrel, Rebekah Chesterfield, MD  blood glucose meter kit and supplies KIT Dispense based on patient and insurance preference. Use up to four times daily as directed. (FOR ICD-9 250.00, 250.01). ( 11/14/17   Nwoko, Nicole [name] I, NP  divalproex (DEPAKOTE) 500 MG DR tablet Take 1 tablet (500 mg total) by mouth every 12 (twelve) hours. For mood stabilization 06/13/22   Pokhrel, Rebekah Chesterfield, MD  doxepin (SINEQUAN) 25 MG capsule Take 1 capsule (25 mg total) by mouth at bedtime. For insomnia 06/13/22   Pokhrel, Rebekah Chesterfield, MD  doxycycline (VIBRA-TABS) 100 MG tablet Take 1 tablet (100 mg total) by mouth every 12 (twelve) hours. 06/13/22   Pokhrel, Rebekah Chesterfield, MD  fluvoxaMINE (LUVOX) 100 MG tablet Take 1 tablet (100 mg total) by mouth at bedtime. For depression/OCD 06/13/22   Joycelyn Das, MD  gabapentin (NEURONTIN) 300 MG capsule Take 1 capsule (300 mg total) by mouth 2 (two) times daily. 06/13/22   Pokhrel, Rebekah Chesterfield, MD  insulin aspart (NOVOLOG) 100 UNIT/ML injection Take 4 units three times a day before meals: For diabetes management 06/13/22 06/13/23  Pokhrel, Rebekah Chesterfield, MD  insulin aspart protamine- aspart (NOVOLOG MIX 70/30) (70-30) 100 UNIT/ML injection Inject 0.18 mLs (18 Units total) into the skin 2 (two) times daily with a meal. For diabetes management 06/13/22   Joycelyn Das, MD  lidocaine (LIDODERM) 5 % Place 1 patch onto the skin daily. Remove & Discard patch within 12 hours or as directed by MD 06/13/22   Pokhrel, Rebekah Chesterfield, MD  lisinopril (ZESTRIL) 10 MG tablet Take 1 tablet (10 mg total) by mouth daily. For high blood pressure 06/13/22   Pokhrel, Laxman, MD  loratadine (CLARITIN) 10 MG tablet Take 1 tablet (10 mg total) by mouth daily. (May purchase from over the counter): For allergies 06/13/22 07/13/22  Pokhrel, Rebekah Chesterfield, MD  metFORMIN (GLUCOPHAGE) 500 MG tablet Take 1 tablet (500 mg total) by mouth 2 (two) times daily with a meal. For diabetes management 06/13/22   Pokhrel, Rebekah Chesterfield, MD  polyethylene  glycol (MIRALAX / GLYCOLAX) 17 g packet Take 17 g by mouth daily. 06/14/22   Pokhrel, Rebekah Chesterfield, MD  prazosin (MINIPRESS) 2 MG capsule Take 1 capsule (2 mg total) by mouth 2 (two) times daily at 8 am and 10 pm. For PTSD related nightmares 06/13/22   Joycelyn Das, MD      Allergies    Sulfa antibiotics, Trazodone and nefazodone, and Zofran [ondansetron hcl]    Review of Systems   Review of Systems  Respiratory:  Negative for cough.   Cardiovascular:  Positive for leg swelling.  Gastrointestinal:  Positive for vomiting. Negative for abdominal pain.  Musculoskeletal:  Positive for arthralgias, back pain and myalgias.  Skin:  Positive for color change.  Neurological:  Positive for weakness.    Physical Exam Updated Vital Signs BP (!) 151/108 (BP Location: Right Arm)   Pulse (!) 139   Temp (!) 100.7 F (38.2 C) (Oral)   Resp 19   Ht 5\' 9"  (1.753 m)   Wt 92 kg   SpO2 96%   BMI 29.95 kg/m  Physical Exam Vitals and nursing note reviewed.  Constitutional:      Appearance: He is well-developed.  HENT:     Head: Normocephalic and atraumatic.  Cardiovascular:     Rate and Rhythm: Regular rhythm. Tachycardia present.     Pulses:          Dorsalis pedis pulses are 2+ on the right side and 2+ on the left side.     Heart sounds: Normal heart sounds. No murmur heard. Pulmonary:     Effort: Pulmonary effort is normal.     Breath sounds: Normal breath sounds.  Abdominal:     Palpations: Abdomen is soft.     Tenderness: There is no abdominal tenderness.  Musculoskeletal:     Comments: I do not feel any palpable abscess or swelling to his left buttock or thigh.  He has a lot of tenderness at his gluteal crease.  There is no obvious induration. He has patchy erythema and warmth to bilateral lower legs and feet with swelling bilaterally. There is a lot of pain and difficulty with range of motion of the left lower extremity.  Skin:    General: Skin is warm and dry.     Comments: Patient  has diffuse folliculitis but on my examination no obvious abscess.  Neurological:     Mental Status: He is alert.    ED Results / Procedures / Treatments   Labs (all labs ordered are listed, but only abnormal results are displayed) Labs Reviewed  COMPREHENSIVE METABOLIC PANEL - Abnormal; Notable for the following components:      Result Value   Sodium 129 (*)    Chloride 96 (*)    CO2 21 (*)    Glucose, Bld 169 (*)  Creatinine, Ser 1.70 (*)    Alkaline Phosphatase 199 (*)    GFR, Estimated 52 (*)    All other components within normal limits  CBC WITH DIFFERENTIAL/PLATELET - Abnormal; Notable for the following components:   WBC 10.9 (*)    RBC 4.18 (*)    Hemoglobin 12.4 (*)    HCT 36.5 (*)    Neutro Abs 7.8 (*)    Monocytes Absolute 1.4 (*)    All other components within normal limits  PROTIME-INR - Abnormal; Notable for the following components:   Prothrombin Time 16.1 (*)    INR 1.3 (*)    All other components within normal limits  I-STAT CG4 LACTIC ACID, ED - Abnormal; Notable for the following components:   Lactic Acid, Venous 2.3 (*)    All other components within normal limits  CULTURE, BLOOD (ROUTINE X 2)  CULTURE, BLOOD (ROUTINE X 2)  SARS CORONAVIRUS 2 BY RT PCR  RESP PANEL BY RT-PCR (RSV, FLU A&B, COVID)  [id number]  URINALYSIS, W/ REFLEX TO CULTURE (INFECTION SUSPECTED)  BRAIN NATRIURETIC PEPTIDE    EKG None  Radiology No results found.  Procedures .Critical Care  Performed by: Pricilla Loveless, MD Authorized by: Pricilla Loveless, MD   Critical care provider statement:    Critical care time (minutes):  40   Critical care time was exclusive of:  Separately billable procedures and treating other patients   Critical care was necessary to treat or prevent imminent or life-threatening deterioration of the following conditions:  Sepsis   Critical care was time spent personally by me on the following activities:  Development of treatment plan with patient or  surrogate, discussions with consultants, evaluation of patient's response to treatment, examination of patient, ordering and review of laboratory studies, ordering and review of radiographic studies, ordering and performing treatments and interventions, pulse oximetry, re-evaluation of patient's condition and review of old charts     Medications Ordered in ED Medications  acetaminophen (TYLENOL) tablet 1,000 mg (has no administration in time range)  lactated ringers bolus 1,000 mL (has no administration in time range)  ceFEPIme (MAXIPIME) 2 g in sodium chloride 0.9 % 100 mL IVPB (has no administration in time range)  metroNIDAZOLE (FLAGYL) IVPB 500 mg (has no administration in time range)  HYDROmorphone (DILAUDID) injection 0.5 mg (has no administration in time range)  vancomycin (VANCOREADY) IVPB 2000 mg/400 mL (has no administration in time range)    ED Course/ Medical Decision Making/ A&P                             Medical Decision Making Amount and/or Complexity of Data Reviewed External Data Reviewed: notes. Labs: ordered.    Details: Lactate 2.3.  Mild leukocytosis.  Kidney disease seems to be about baseline. Radiology: ordered and independent interpretation performed.    Details: Right lower lobe opacities, underinflation in general. ECG/medicine tests: ordered and independent interpretation performed.    Details: Sinus tachycardia  Risk OTC drugs. Prescription drug management.   Patient presents with acute infection symptoms.  He is febrile, tachycardic.  White blood cell count is mildly elevated lactate 2.3.  Meets criteria for sepsis and started on broad IV antibiotics.  He has a previous history of bacteremia, endocarditis, and spinal abscesses.  He is describing a lot of pain to his buttock/posterior thigh though I do not appreciate any obvious abscess on exam.  He will need CTs for imaging of deeper infection.  Given the abnormal chest x-ray results we will expand his  CTs to include his abdomen and chest.  Otherwise, treated with fluids, Tylenol, pain control.  CTs were initially delayed because he had too much pain when trying to lay flat and transfer over to the CT table.  Will give a stronger dose of pain medicine and have him retry the CTs.  If all of this is negative he will ultimately probably need MRIs given his spinal abscess history.  However he is stating that his back pain is not worse than baseline.  Care transferred to Dr. Blinda Leatherwood.        Final Clinical Impression(s) / ED Diagnoses Final diagnoses:  None    Rx / DC Orders ED Discharge Orders     None         Pricilla Loveless, MD 02/09/23 [phone]

## 2023-02-09 NOTE — Progress Notes (Signed)
ED Pharmacy Antibiotic Sign Off An antibiotic consult was received from an ED provider for vancomycin and cefepime per pharmacy dosing for sepsis. A chart review was completed to assess appropriateness.   The following one time order(s) were placed:  Vancomycin 2000mg  IV once Cefepime 2g IV once  Further antibiotic and/or antibiotic pharmacy consults should be ordered by the admitting provider if indicated.   Thank you for allowing pharmacy to be a part of this patient's care.   Eldridge Scot, PharmD Clinical Pharmacist 02/09/2023, 9:24 PM

## 2023-02-09 NOTE — Sepsis Progress Note (Signed)
Elink following for Sepsis Protocol 

## 2023-02-09 NOTE — ED Triage Notes (Signed)
Pt c/o increased swelling in his BLE x 1 day, weakness, and bumps on his buttocks. Pt states that he was diagnosed with multiple myeloma last year, but has not had treatment since he went to prison shortly after being diagnosed.

## 2023-02-09 NOTE — ED Notes (Signed)
Patient transported to CT 

## 2023-02-10 ENCOUNTER — Inpatient Hospital Stay (HOSPITAL_COMMUNITY): Payer: No Typology Code available for payment source

## 2023-02-10 ENCOUNTER — Emergency Department (HOSPITAL_COMMUNITY): Payer: No Typology Code available for payment source

## 2023-02-10 DIAGNOSIS — F4310 Post-traumatic stress disorder, unspecified: Secondary | ICD-10-CM | POA: Diagnosis present

## 2023-02-10 DIAGNOSIS — F1910 Other psychoactive substance abuse, uncomplicated: Secondary | ICD-10-CM

## 2023-02-10 DIAGNOSIS — W19XXXA Unspecified fall, initial encounter: Secondary | ICD-10-CM | POA: Diagnosis present

## 2023-02-10 DIAGNOSIS — E876 Hypokalemia: Secondary | ICD-10-CM | POA: Diagnosis present

## 2023-02-10 DIAGNOSIS — M7989 Other specified soft tissue disorders: Secondary | ICD-10-CM | POA: Diagnosis present

## 2023-02-10 DIAGNOSIS — F444 Conversion disorder with motor symptom or deficit: Secondary | ICD-10-CM | POA: Diagnosis present

## 2023-02-10 DIAGNOSIS — L03119 Cellulitis of unspecified part of limb: Secondary | ICD-10-CM | POA: Insufficient documentation

## 2023-02-10 DIAGNOSIS — Z8673 Personal history of transient ischemic attack (TIA), and cerebral infarction without residual deficits: Secondary | ICD-10-CM

## 2023-02-10 DIAGNOSIS — G8929 Other chronic pain: Secondary | ICD-10-CM | POA: Diagnosis present

## 2023-02-10 DIAGNOSIS — E871 Hypo-osmolality and hyponatremia: Secondary | ICD-10-CM | POA: Diagnosis present

## 2023-02-10 DIAGNOSIS — Z993 Dependence on wheelchair: Secondary | ICD-10-CM | POA: Diagnosis not present

## 2023-02-10 DIAGNOSIS — R7881 Bacteremia: Secondary | ICD-10-CM | POA: Diagnosis not present

## 2023-02-10 DIAGNOSIS — Z794 Long term (current) use of insulin: Secondary | ICD-10-CM | POA: Diagnosis not present

## 2023-02-10 DIAGNOSIS — E872 Acidosis, unspecified: Secondary | ICD-10-CM | POA: Diagnosis present

## 2023-02-10 DIAGNOSIS — L03116 Cellulitis of left lower limb: Secondary | ICD-10-CM | POA: Diagnosis present

## 2023-02-10 DIAGNOSIS — R6520 Severe sepsis without septic shock: Secondary | ICD-10-CM | POA: Insufficient documentation

## 2023-02-10 DIAGNOSIS — A4102 Sepsis due to Methicillin resistant Staphylococcus aureus: Secondary | ICD-10-CM

## 2023-02-10 DIAGNOSIS — S32512A Fracture of superior rim of left pubis, initial encounter for closed fracture: Secondary | ICD-10-CM | POA: Diagnosis present

## 2023-02-10 DIAGNOSIS — Z8739 Personal history of other diseases of the musculoskeletal system and connective tissue: Secondary | ICD-10-CM | POA: Diagnosis not present

## 2023-02-10 DIAGNOSIS — E1122 Type 2 diabetes mellitus with diabetic chronic kidney disease: Secondary | ICD-10-CM | POA: Diagnosis present

## 2023-02-10 DIAGNOSIS — F32A Depression, unspecified: Secondary | ICD-10-CM

## 2023-02-10 DIAGNOSIS — A4101 Sepsis due to Methicillin susceptible Staphylococcus aureus: Secondary | ICD-10-CM | POA: Diagnosis present

## 2023-02-10 DIAGNOSIS — A419 Sepsis, unspecified organism: Secondary | ICD-10-CM | POA: Diagnosis not present

## 2023-02-10 DIAGNOSIS — I129 Hypertensive chronic kidney disease with stage 1 through stage 4 chronic kidney disease, or unspecified chronic kidney disease: Secondary | ICD-10-CM | POA: Diagnosis present

## 2023-02-10 DIAGNOSIS — Z1152 Encounter for screening for COVID-19: Secondary | ICD-10-CM | POA: Diagnosis not present

## 2023-02-10 DIAGNOSIS — Z87898 Personal history of other specified conditions: Secondary | ICD-10-CM | POA: Diagnosis not present

## 2023-02-10 DIAGNOSIS — C9000 Multiple myeloma not having achieved remission: Secondary | ICD-10-CM | POA: Diagnosis present

## 2023-02-10 DIAGNOSIS — L03115 Cellulitis of right lower limb: Secondary | ICD-10-CM | POA: Diagnosis present

## 2023-02-10 DIAGNOSIS — K5900 Constipation, unspecified: Secondary | ICD-10-CM | POA: Diagnosis present

## 2023-02-10 DIAGNOSIS — S32511A Fracture of superior rim of right pubis, initial encounter for closed fracture: Secondary | ICD-10-CM | POA: Diagnosis present

## 2023-02-10 DIAGNOSIS — N1831 Chronic kidney disease, stage 3a: Secondary | ICD-10-CM | POA: Diagnosis present

## 2023-02-10 DIAGNOSIS — M8448XA Pathological fracture, other site, initial encounter for fracture: Secondary | ICD-10-CM | POA: Diagnosis present

## 2023-02-10 LAB — GLUCOSE, CAPILLARY
Glucose-Capillary: 154 mg/dL — ABNORMAL HIGH (ref 70–99)
Glucose-Capillary: 148 mg/dL — ABNORMAL HIGH (ref 70–99)

## 2023-02-10 LAB — RESP PANEL BY RT-PCR (RSV, FLU A&B, COVID)  RVPGX2
Influenza A by PCR: NEGATIVE
Influenza B by PCR: NEGATIVE
Resp Syncytial Virus by PCR: NEGATIVE
SARS Coronavirus 2 by RT PCR: NEGATIVE

## 2023-02-10 LAB — COMPREHENSIVE METABOLIC PANEL
ALT: 12 U/L (ref 0–44)
AST: 14 U/L — ABNORMAL LOW (ref 15–41)
Albumin: 2.5 g/dL — ABNORMAL LOW (ref 3.5–5.0)
Alkaline Phosphatase: 158 U/L — ABNORMAL HIGH (ref 38–126)
Anion gap: 10 (ref 5–15)
BUN: 17 mg/dL (ref 6–20)
CO2: 22 mmol/L (ref 22–32)
Calcium: 8.2 mg/dL — ABNORMAL LOW (ref 8.9–10.3)
Chloride: 97 mmol/L — ABNORMAL LOW (ref 98–111)
Creatinine, Ser: 1.6 mg/dL — ABNORMAL HIGH (ref 0.61–1.24)
GFR, Estimated: 56 mL/min — ABNORMAL LOW (ref >60)
Glucose, Bld: 156 mg/dL — ABNORMAL HIGH (ref 70–99)
Potassium: 3.4 mmol/L — ABNORMAL LOW (ref 3.5–5.1)
Sodium: 129 mmol/L — ABNORMAL LOW (ref 135–145)
Total Bilirubin: 1.2 mg/dL (ref 0.3–1.2)
Total Protein: 6.2 g/dL — ABNORMAL LOW (ref 6.5–8.1)

## 2023-02-10 LAB — BLOOD CULTURE ID PANEL (REFLEXED) - BCID2

## 2023-02-10 LAB — CBC WITH DIFFERENTIAL/PLATELET
Abs Immature Granulocytes: 0.04 10*3/uL (ref 0.00–0.07)
Basophils Absolute: 0 10*3/uL (ref 0.0–0.1)
Basophils Relative: 0 %
Eosinophils Absolute: 0 10*3/uL (ref 0.0–0.5)
Eosinophils Relative: 0 %
HCT: 32.1 % — ABNORMAL LOW (ref 39.0–52.0)
Hemoglobin: 10.9 g/dL — ABNORMAL LOW (ref 13.0–17.0)
Immature Granulocytes: 0 %
Lymphocytes Relative: 10 %
Lymphs Abs: 1.1 10*3/uL (ref 0.7–4.0)
MCH: 29.4 pg (ref 26.0–34.0)
MCHC: 34 g/dL (ref 30.0–36.0)
MCV: 86.5 fL (ref 80.0–100.0)
Monocytes Absolute: 1.5 10*3/uL — ABNORMAL HIGH (ref 0.1–1.0)
Monocytes Relative: 13 %
Neutro Abs: 8.8 10*3/uL — ABNORMAL HIGH (ref 1.7–7.7)
Neutrophils Relative %: 77 %
Platelets: 209 10*3/uL (ref 150–400)
RBC: 3.71 MIL/uL — ABNORMAL LOW (ref 4.22–5.81)
RDW: 12.9 % (ref 11.5–15.5)
WBC: 11.5 10*3/uL — ABNORMAL HIGH (ref 4.0–10.5)
nRBC: 0 % (ref 0.0–0.2)

## 2023-02-10 LAB — RAPID URINE DRUG SCREEN, HOSP PERFORMED
Amphetamines: POSITIVE — AB
Barbiturates: NOT DETECTED
Benzodiazepines: NOT DETECTED
Cocaine: NOT DETECTED
Opiates: NOT DETECTED
Tetrahydrocannabinol: NOT DETECTED

## 2023-02-10 LAB — URINALYSIS, W/ REFLEX TO CULTURE (INFECTION SUSPECTED)
Bacteria, UA: NONE SEEN
Bilirubin Urine: NEGATIVE
Glucose, UA: NEGATIVE mg/dL
Ketones, ur: NEGATIVE mg/dL
Leukocytes,Ua: NEGATIVE
Nitrite: NEGATIVE
Protein, ur: 30 mg/dL — AB
Specific Gravity, Urine: 1.011 (ref 1.005–1.030)
pH: 7 (ref 5.0–8.0)

## 2023-02-10 LAB — SEDIMENTATION RATE: Sed Rate: 50 mm/hr — ABNORMAL HIGH (ref 0–16)

## 2023-02-10 LAB — C-REACTIVE PROTEIN: CRP: 18.4 mg/dL — ABNORMAL HIGH (ref <1.0)

## 2023-02-10 LAB — CK: Total CK: 141 U/L (ref 49–397)

## 2023-02-10 LAB — MAGNESIUM: Magnesium: 1.6 mg/dL — ABNORMAL LOW (ref 1.7–2.4)

## 2023-02-10 LAB — I-STAT CG4 LACTIC ACID, ED: Lactic Acid, Venous: 1.9 mmol/L (ref 0.5–1.9)

## 2023-02-10 MED ORDER — METRONIDAZOLE 500 MG/100ML IV SOLN
500.0000 mg | Freq: Two times a day (BID) | INTRAVENOUS | Status: DC
  Administered 2023-02-10: 500 mg via INTRAVENOUS
  Filled 2023-02-10: qty 100

## 2023-02-10 MED ORDER — SODIUM CHLORIDE 0.9 % IV SOLN: INTRAVENOUS | Status: DC | PRN

## 2023-02-10 MED ORDER — LORAZEPAM 1 MG PO TABS
1.0000 mg | ORAL_TABLET | Freq: Once | ORAL | Status: AC
  Administered 2023-02-10: 1 mg via ORAL
  Filled 2023-02-10: qty 1

## 2023-02-10 MED ORDER — POLYETHYLENE GLYCOL 3350 17 G PO PACK
17.0000 g | PACK | Freq: Two times a day (BID) | ORAL | Status: DC
  Administered 2023-02-14: 17 g via ORAL
  Filled 2023-02-10 (×8): qty 1

## 2023-02-10 MED ORDER — CEFAZOLIN SODIUM-DEXTROSE 2-4 GM/100ML-% IV SOLN: 2.0000 g | Freq: Three times a day (TID) | INTRAVENOUS | Status: DC

## 2023-02-10 MED ORDER — HYDROMORPHONE HCL 1 MG/ML IJ SOLN: 2.0000 mg | Freq: Once | INTRAMUSCULAR | Status: DC | PRN

## 2023-02-10 MED ORDER — MAGNESIUM SULFATE 2 GM/50ML IV SOLN
2.0000 g | Freq: Once | INTRAVENOUS | Status: AC
  Administered 2023-02-10: 2 g via INTRAVENOUS
  Filled 2023-02-10: qty 50

## 2023-02-10 MED ORDER — HYDROMORPHONE HCL 1 MG/ML IJ SOLN
2.0000 mg | Freq: Once | INTRAMUSCULAR | Status: AC
  Administered 2023-02-10: 2 mg via INTRAVENOUS
  Filled 2023-02-10: qty 2

## 2023-02-10 MED ORDER — SODIUM CHLORIDE 0.9 % IV SOLN
2.0000 g | Freq: Three times a day (TID) | INTRAVENOUS | Status: DC
  Administered 2023-02-10: 2 g via INTRAVENOUS
  Filled 2023-02-10: qty 12.5

## 2023-02-10 MED ORDER — INSULIN ASPART 100 UNIT/ML IJ SOLN: 0.0000 [IU] | Freq: Every day | INTRAMUSCULAR | Status: DC

## 2023-02-10 MED ORDER — MELATONIN 3 MG PO TABS
3.0000 mg | ORAL_TABLET | Freq: Every evening | ORAL | Status: DC | PRN
  Administered 2023-02-11: 3 mg via ORAL
  Filled 2023-02-10 (×3): qty 1

## 2023-02-10 MED ORDER — ACETAMINOPHEN 650 MG RE SUPP: 650.0000 mg | Freq: Four times a day (QID) | RECTAL | Status: DC | PRN

## 2023-02-10 MED ORDER — ACETAMINOPHEN 325 MG PO TABS
650.0000 mg | ORAL_TABLET | Freq: Four times a day (QID) | ORAL | Status: DC | PRN
  Administered 2023-02-11 – 2023-02-12 (×4): 650 mg via ORAL
  Filled 2023-02-10 (×5): qty 2

## 2023-02-10 MED ORDER — SENNOSIDES-DOCUSATE SODIUM 8.6-50 MG PO TABS
2.0000 | ORAL_TABLET | Freq: Two times a day (BID) | ORAL | Status: DC
  Filled 2023-02-10 (×9): qty 2

## 2023-02-10 MED ORDER — NALOXONE HCL 0.4 MG/ML IJ SOLN: 0.4000 mg | INTRAMUSCULAR | Status: DC | PRN

## 2023-02-10 MED ORDER — VANCOMYCIN HCL 1750 MG/350ML IV SOLN
1750.0000 mg | INTRAVENOUS | Status: DC
  Administered 2023-02-10 – 2023-02-12 (×3): 1750 mg via INTRAVENOUS
  Filled 2023-02-10 (×4): qty 350

## 2023-02-10 MED ORDER — VANCOMYCIN HCL 1750 MG/350ML IV SOLN
1750.0000 mg | INTRAVENOUS | Status: DC
  Filled 2023-02-10: qty 350

## 2023-02-10 MED ORDER — HYDROMORPHONE HCL 1 MG/ML IJ SOLN
0.5000 mg | INTRAMUSCULAR | Status: DC | PRN
  Administered 2023-02-10 – 2023-02-17 (×21): 0.5 mg via INTRAVENOUS
  Filled 2023-02-10 (×21): qty 1

## 2023-02-10 MED ORDER — INSULIN ASPART 100 UNIT/ML IJ SOLN
0.0000 [IU] | Freq: Three times a day (TID) | INTRAMUSCULAR | Status: DC
  Administered 2023-02-10: 3 [IU] via SUBCUTANEOUS
  Administered 2023-02-11: 2 [IU] via SUBCUTANEOUS
  Administered 2023-02-11: 3 [IU] via SUBCUTANEOUS
  Administered 2023-02-11 – 2023-02-12 (×3): 2 [IU] via SUBCUTANEOUS
  Administered 2023-02-12: 3 [IU] via SUBCUTANEOUS
  Administered 2023-02-13: 2 [IU] via SUBCUTANEOUS
  Administered 2023-02-13: 3 [IU] via SUBCUTANEOUS
  Administered 2023-02-14 – 2023-02-16 (×5): 2 [IU] via SUBCUTANEOUS

## 2023-02-10 MED ORDER — POTASSIUM CHLORIDE CRYS ER 20 MEQ PO TBCR
40.0000 meq | EXTENDED_RELEASE_TABLET | Freq: Once | ORAL | Status: AC
  Administered 2023-02-10: 40 meq via ORAL
  Filled 2023-02-10: qty 2

## 2023-02-10 NOTE — H&P (Signed)
History and Physical    Patient: Ronald Hampton XLK:[id number] DOB: 03-23-1981 DOA: 02/09/2023 DOS: the patient was seen and examined on 02/10/2023 PCP: Patient, No Pcp Per  Patient coming from: Extended stay  Chief Complaint:  Chief Complaint  Patient presents with   Weakness   HPI: Ronald Hampton is a 42 y.o. male with PMH of multiple myeloma, CKD, depression, PTSD, bipolar disorder, CVA, HTN, polysubstance use/IVDU, DM-2, endocarditis, osteomyelitis and discitis presenting with constipation and nausea.  Patient is not a great historian.  He reports coming to the hospital due to difficulty using the bathroom for 3 days.  He also reports associated nausea.  No other complaints until specifically asked for symptoms.  He admits to fever with a temperature of 100.8 F yesterday.  He also admits to abdominal pain over LLQ for 2 to 3 weeks.  He admits to lower back pain for 2 years without acute change.  He reports generalized weakness for about a week.  He reports about 7 falls from feeling dizzy both spinning and lightheadedness.  He reports numbness on and off in both legs.  He reports taking pain medication for his pain.  He reports injecting meth.  He states he last injected about 2 weeks ago.  He also reports a skin rash on his abdomen.  He denies headache or neck stiff neck.  He denies smoking cigarettes or drinking alcohol.  He likes to have cardiopulmonary resuscitation in an event of sudden cardiopulmonary arrest.  In ED, septic with fever, tachycardia, tachypnea.  Normotensive. Na 129.  K3.4. Cr 1.6.  BUN 17.  Mg 1.6.  LFT normal.  WBC 11.5.  Hgb 10.9.  CK1 41.  Lactic acid 1.9.  Influenza, COVID-19 and RSV nonreactive.  UA without significant finding.  Chest x-ray with clustered rounded opacities in the RML felt to be balls of stool within interposed hepatic flexure.  CT chest, abdomen and pelvis as well as left femur ordered but unable to complete as patient refused CT  due to pain despite multiple rounds of IV Dilaudid.    Review of Systems: As mentioned in the history of present illness. All other systems reviewed and are negative. Past Medical History:  Diagnosis Date   Bipolar 1 disorder (HCC)    Depression    PTSD (post-traumatic stress disorder)    Past Surgical History:  Procedure Laterality Date   HERNIA REPAIR     Right Foot Surgery     Social History:  reports that he has never smoked. He has never used smokeless tobacco. He reports that he does not currently use alcohol. He reports that he does not currently use drugs after having used the following drugs: Heroin.  Allergies  Allergen Reactions   Sulfa Antibiotics Anaphylaxis   Trazodone And Nefazodone    Zofran [Ondansetron Hcl] Rash    History reviewed. No pertinent family history.  Prior to Admission medications   Medication Sig Start Date End Date Taking? Authorizing Provider  albuterol (VENTOLIN HFA) 108 (90 Base) MCG/ACT inhaler Inhale 2 puffs into the lungs every 6 (six) hours as needed for wheezing or shortness of breath. 06/13/22   Pokhrel, Rebekah Chesterfield, MD  ARIPiprazole (ABILIFY) 20 MG tablet Take 1 tablet (20 mg total) by mouth daily. For mood control 06/13/22   Pokhrel, Rebekah Chesterfield, MD  ARIPiprazole ER (ABILIFY MAINTENA) 400 MG SRER injection Inject 2 mLs (400 mg total) into the muscle every 28 (twenty-eight) days. Patient not taking: Reported on 06/03/2022 11/21/17  Armandina Stammer I, NP  aspirin EC 81 MG tablet Take 1 tablet (81 mg total) by mouth daily. Swallow whole. 06/14/22   Pokhrel, Rebekah Chesterfield, MD  blood glucose meter kit and supplies KIT Dispense based on patient and insurance preference. Use up to four times daily as directed. (FOR ICD-9 250.00, 250.01). ( 11/14/17   Nwoko, Nicole [name] I, NP  divalproex (DEPAKOTE) 500 MG DR tablet Take 1 tablet (500 mg total) by mouth every 12 (twelve) hours. For mood stabilization 06/13/22   Pokhrel, Rebekah Chesterfield, MD  doxepin (SINEQUAN) 25 MG capsule Take 1  capsule (25 mg total) by mouth at bedtime. For insomnia 06/13/22   Pokhrel, Rebekah Chesterfield, MD  doxycycline (VIBRA-TABS) 100 MG tablet Take 1 tablet (100 mg total) by mouth every 12 (twelve) hours. 06/13/22   Pokhrel, Rebekah Chesterfield, MD  fluvoxaMINE (LUVOX) 100 MG tablet Take 1 tablet (100 mg total) by mouth at bedtime. For depression/OCD 06/13/22   Joycelyn Das, MD  gabapentin (NEURONTIN) 300 MG capsule Take 1 capsule (300 mg total) by mouth 2 (two) times daily. 06/13/22   Pokhrel, Rebekah Chesterfield, MD  insulin aspart (NOVOLOG) 100 UNIT/ML injection Take 4 units three times a day before meals: For diabetes management 06/13/22 06/13/23  Pokhrel, Rebekah Chesterfield, MD  insulin aspart protamine- aspart (NOVOLOG MIX 70/30) (70-30) 100 UNIT/ML injection Inject 0.18 mLs (18 Units total) into the skin 2 (two) times daily with a meal. For diabetes management 06/13/22   Pokhrel, Rebekah Chesterfield, MD  lidocaine (LIDODERM) 5 % Place 1 patch onto the skin daily. Remove & Discard patch within 12 hours or as directed by MD 06/13/22   Pokhrel, Rebekah Chesterfield, MD  lisinopril (ZESTRIL) 10 MG tablet Take 1 tablet (10 mg total) by mouth daily. For high blood pressure 06/13/22   Pokhrel, Laxman, MD  loratadine (CLARITIN) 10 MG tablet Take 1 tablet (10 mg total) by mouth daily. (May purchase from over the counter): For allergies 06/13/22 07/13/22  Pokhrel, Rebekah Chesterfield, MD  metFORMIN (GLUCOPHAGE) 500 MG tablet Take 1 tablet (500 mg total) by mouth 2 (two) times daily with a meal. For diabetes management 06/13/22   Pokhrel, Rebekah Chesterfield, MD  polyethylene glycol (MIRALAX / GLYCOLAX) 17 g packet Take 17 g by mouth daily. 06/14/22   Pokhrel, Rebekah Chesterfield, MD  prazosin (MINIPRESS) 2 MG capsule Take 1 capsule (2 mg total) by mouth 2 (two) times daily at 8 am and 10 pm. For PTSD related nightmares 06/13/22   Joycelyn Das, MD    Physical Exam: Vitals:   02/10/23 0645 02/10/23 0700 02/10/23 0715 02/10/23 0900  BP: (!) 151/99 (!) 164/104 (!) 160/102 (!) 159/95  Pulse: (!) 106 (!) 101 (!) 101  (!) 113  Resp: (!) 27 (!) 22 18 18   Temp:    98.5 F (36.9 C)  TempSrc:      SpO2: 97% 100% 100% 100%  Weight:      Height:       GENERAL: No apparent distress.  Nontoxic. HEENT: MMM.  Vision and hearing grossly intact.  NECK: Supple.  No apparent JVD.  RESP:  No IWOB.  Fair aeration bilaterally. CVS:  RRR. Heart sounds normal.  ABD/GI/GU: BS+. Abd soft, NTND.  Small scattered abdominal skin wounds with surrounding erythema MSK/EXT:   BLE weakness.  1+ BLE edema. SKIN: Erythema, swelling and increased warmth to touch in BLE. NEURO: Sleepy but wakes to voice.  Oriented appropriately.  No apparent focal neuro deficit but BLE weakness. PSYCH: Calm. Normal affect.  Data Reviewed: See HPI  Assessment and Plan: Principal  Problem:   Sepsis due to cellulitis [facility]) Active Problems:   History of discitis   History of bacteremia   History of osteomyelitis   Depression   History of CVA (cerebrovascular accident)   Polysubstance abuse (HCC)   Bipolar I disorder, current or most recent episode depressed, with psychotic features (HCC)   PTSD (post-traumatic stress disorder)   HTN (hypertension)   Lower extremity cellulitis   Severe sepsis due to bilateral lower extremity cellulitis: POA.  Febrile with tachycardia, tachypnea, leukocytosis and lactic acidosis.  Patient with history of IVDU.  He has bilateral lower extremity swelling, erythema and increased warmth to touch.  He also has scattered abdominal wall skin wounds with surrounding erythema.  Previously treated for bacteremia came osteomyelitis and discitis.  Reportedly completed treatment.  Blood cultures NGTD.  -Broad-spectrum antibiotics with Vanco mycin, cefepime and Flagyl -Follow-up blood cultures. -Follow CT chest, abdomen, pelvis and left leg if patient cooprates -Check UDS  Bilateral lower extremity weakness/recurrent fall: Patient close chronic back pain over 2 years unchanged from baseline.  Has history of  discitis/osteomyelitis.  Unfortunately, he is refusing CT. I doubt he will agree to MRI even if you want to pursue this. -Broad-spectrum antibiotics as above for now -Check CRP and ESR. -PT/OT eval  CKD-3A: At baseline. Recent Labs    06/03/22 0014 06/04/22 0725 06/05/22 0519 06/06/22 0422 06/11/22 0231 06/12/22 1923 02/09/23 2046 02/10/23 0507  BUN 43* 26* 19 21* 24* 24* 18 17  CREATININE 2.35* 1.90* 1.59* 1.52* 1.80* 1.63* 1.70* 1.60*  -Monitor.   Depression/BPD/PTSD/OCD: Stable.  Denies SI or HI. -Resume home meds after med rec.  Hyponatremia -Continue monitoring -Check urine sodium and osmolality  Hypokalemia/hypomagnesemia -Monitor replenish as appropriate     Advance Care Planning:   Code Status: Full Code   Consults: Full code  Family Communication: None at bedside  Severity of Illness: The appropriate patient status for this patient is INPATIENT. Inpatient status is judged to be reasonable and necessary in order to provide the required intensity of service to ensure the patient's safety. The patient's presenting symptoms, physical exam findings, and initial radiographic and laboratory data in the context of their chronic comorbidities is felt to place them at high risk for further clinical deterioration. Furthermore, it is not anticipated that the patient will be medically stable for discharge from the hospital within 2 midnights of admission.   * I certify that at the point of admission it is my clinical judgment that the patient will require inpatient hospital care spanning beyond 2 midnights from the point of admission due to high intensity of service, high risk for further deterioration and high frequency of surveillance required.*  Author: Almon Hercules, MD 02/10/2023 10:55 AM  For on call review [email or web address].

## 2023-02-10 NOTE — ED Notes (Signed)
EKG printed under wrong pt. Last EKD VOID

## 2023-02-10 NOTE — Progress Notes (Signed)
PHARMACY - PHYSICIAN COMMUNICATION CRITICAL VALUE ALERT - BLOOD CULTURE IDENTIFICATION (BCID)  Ronald Hampton is an 42 y.o. male who presented to [facility] on 02/09/2023  Assessment:  41 yom with hx polysubstance abuse, endocarditis, osteomyelitis, and discitis presenting with back pain and fever, initiated on broad spectrum antibiotics. Now with 4/4 blood cultures positive for MRSA.  Name of physician (or Provider) Contacted: Ronald Hampton, Ronald Hampton [facility])  Current antibiotics: vancomycin/cefepime/flagyl  Changes to prescribed antibiotics recommended:  Continue vancomycin, d/c cefepime/flagyl per MD  Results for orders placed or performed during the hospital encounter of 02/09/23  Blood Culture ID Panel (Reflexed) (Collected: 02/09/2023  9:08 PM)  Result Value Ref Range   Enterococcus faecalis NOT DETECTED NOT DETECTED   Enterococcus Faecium NOT DETECTED NOT DETECTED   Listeria monocytogenes NOT DETECTED NOT DETECTED   Staphylococcus species DETECTED (A) NOT DETECTED   Staphylococcus aureus (BCID) DETECTED (A) NOT DETECTED   Staphylococcus epidermidis NOT DETECTED NOT DETECTED   Staphylococcus lugdunensis NOT DETECTED NOT DETECTED   Streptococcus species NOT DETECTED NOT DETECTED   Streptococcus agalactiae NOT DETECTED NOT DETECTED   Streptococcus pneumoniae NOT DETECTED NOT DETECTED   Streptococcus pyogenes NOT DETECTED NOT DETECTED   A.calcoaceticus-baumannii NOT DETECTED NOT DETECTED   Bacteroides fragilis NOT DETECTED NOT DETECTED   Enterobacterales NOT DETECTED NOT DETECTED   Enterobacter cloacae complex NOT DETECTED NOT DETECTED   Escherichia coli NOT DETECTED NOT DETECTED   Klebsiella aerogenes NOT DETECTED NOT DETECTED   Klebsiella oxytoca NOT DETECTED NOT DETECTED   Klebsiella pneumoniae NOT DETECTED NOT DETECTED   Proteus species NOT DETECTED NOT DETECTED   Salmonella species NOT DETECTED NOT DETECTED   Serratia marcescens NOT DETECTED NOT DETECTED   Haemophilus  influenzae NOT DETECTED NOT DETECTED   Neisseria meningitidis NOT DETECTED NOT DETECTED   Pseudomonas aeruginosa NOT DETECTED NOT DETECTED   Stenotrophomonas maltophilia NOT DETECTED NOT DETECTED   Candida albicans NOT DETECTED NOT DETECTED   Candida auris NOT DETECTED NOT DETECTED   Candida glabrata NOT DETECTED NOT DETECTED   Candida krusei NOT DETECTED NOT DETECTED   Candida parapsilosis NOT DETECTED NOT DETECTED   Candida tropicalis NOT DETECTED NOT DETECTED   Cryptococcus neoformans/gattii NOT DETECTED NOT DETECTED   Meth resistant mecA/C and MREJ DETECTED (A) NOT DETECTED     Leia Alf, PharmD, BCPS Please check AMION for all [facility] contact numbers Clinical Pharmacist 02/10/2023 1:14 PM

## 2023-02-10 NOTE — ED Notes (Signed)
0200 the pt has been to ct 2 times he was given dilaudid this time to go to c-t for the third time and he refused to go for the third time  he reports that he is in too much pain however he and his girlfriend had just ordered food from [employer] and ate[employer] just after this

## 2023-02-10 NOTE — ED Notes (Signed)
Patient left the floor in stable condition, AOX4, with staff, all of his belongings, including cell phone, charger, and cane.

## 2023-02-10 NOTE — ED Notes (Signed)
ED TO INPATIENT HANDOFF REPORT  ED Nurse Name and Phone #: Rulon Eisenmenger Name/Age/Gender Ronald Hampton 42 y.o. male Room/Bed: 036C/036C  Code Status   Code Status: Full Code  Home/SNF/Other Home Patient oriented to: self, place, time, and situation Is this baseline? Yes   Triage Complete: Triage complete  Chief Complaint SIRS (systemic inflammatory response syndrome) (HCC) [R65.10]  Triage Note Pt c/o increased swelling in his BLE x 1 day, weakness, and bumps on his buttocks. Pt states that he was diagnosed with multiple myeloma last year, but has not had treatment since he went to prison shortly after being diagnosed.    Allergies Allergies  Allergen Reactions   Sulfa Antibiotics Anaphylaxis   Trazodone And Nefazodone    Zofran [Ondansetron Hcl] Rash    Level of Care/Admitting Diagnosis ED Disposition     ED Disposition  Admit   Condition  --   Comment  Hospital Area: [facility] [[id number]]  Level of Care: Telemetry Medical [[id number]]  May admit patient to [facility] or [facility] if equivalent level of care is available:: No  Covid Evaluation: Asymptomatic - no recent exposure (last 10 days) testing not required  Diagnosis: SIRS (systemic inflammatory response syndrome) [facility]) [[id number]]  Admitting Physician: Angie Fava [[id number]]  Attending Physician: Angie Fava [phone]  Certification:: I certify this patient will need inpatient services for at least 2 midnights  Estimated Length of Stay: 2          B Medical/Surgery History Past Medical History:  Diagnosis Date   Bipolar 1 disorder (HCC)    Depression    PTSD (post-traumatic stress disorder)    Past Surgical History:  Procedure Laterality Date   HERNIA REPAIR     Right Foot Surgery       A IV Location/Drains/Wounds Patient Lines/Drains/Airways Status     Active Line/Drains/Airways     Name Placement date Placement time Site Days   Peripheral IV  02/09/23 20 G Anterior;Proximal;Right Forearm 02/09/23  2100  Forearm  1   Peripheral IV 02/09/23 20 G Left Forearm 02/09/23  2120  Forearm  1   External Urinary Catheter 06/03/22  2147  --  252   Pressure Injury 06/03/22 Coccyx Mid Stage 1 -  Intact skin with non-blanchable redness of a localized area usually over a bony prominence. 06/03/22  1629  -- 252   Wound / Incision (Open or Dehisced) 06/05/22 Non-pressure wound Ankle Anterior;Left area reddned with small scabbed area (0.5x0.7) in the middle 06/05/22  0800  Ankle  250   Wound / Incision (Open or Dehisced) 06/05/22 Skin tear Pretibial Distal;Left skin tear 06/05/22  0800  Pretibial  250            Intake/Output Last 24 hours  Intake/Output Summary (Last 24 hours) at 02/10/2023 1138 Last data filed at 02/10/2023 1000 Gross per 24 hour  Intake 397.01 ml  Output 1300 ml  Net -902.99 ml    Labs/Imaging Results for orders placed or performed during the hospital encounter of 02/09/23 (from the past 48 hour(s))  Comprehensive metabolic panel     Status: Abnormal   Collection Time: 02/09/23  8:46 PM  Result Value Ref Range   Sodium 129 (L) 135 - 145 mmol/L   Potassium 4.0 3.5 - 5.1 mmol/L   Chloride 96 (L) 98 - 111 mmol/L   CO2 21 (L) 22 - 32 mmol/L   Glucose, Bld 169 (H) 70 - 99 mg/dL  Comment: Glucose reference range applies only to samples taken after fasting for at least 8 hours.   BUN 18 6 - 20 mg/dL   Creatinine, Ser 4.09 (H) 0.61 - 1.24 mg/dL   Calcium 9.2 8.9 - 81.1 mg/dL   Total Protein 8.1 6.5 - 8.1 g/dL   Albumin 3.5 3.5 - 5.0 g/dL   AST 19 15 - 41 U/L   ALT 14 0 - 44 U/L   Alkaline Phosphatase 199 (H) 38 - 126 U/L   Total Bilirubin 0.8 0.3 - 1.2 mg/dL   GFR, Estimated 52 (L) >60 mL/min    Comment: (NOTE) Calculated using the CKD-EPI Creatinine Equation (2021)    Anion gap 12 5 - 15    Comment: Performed at [facility], [address], [place], [place] [address]  CBC with Differential     Status:  Abnormal   Collection Time: 02/09/23  8:46 PM  Result Value Ref Range   WBC 10.9 (H) 4.0 - 10.5 K/uL   RBC 4.18 (L) 4.22 - 5.81 MIL/uL   Hemoglobin 12.4 (L) 13.0 - 17.0 g/dL   HCT 29.5 (L) 62.1 - 30.8 %   MCV 87.3 80.0 - 100.0 fL   MCH 29.7 26.0 - 34.0 pg   MCHC 34.0 30.0 - 36.0 g/dL   RDW 65.7 84.6 - 96.2 %   Platelets 305 150 - 400 K/uL   nRBC 0.0 0.0 - 0.2 %   Neutrophils Relative % 69 %   Neutro Abs 7.8 (H) 1.7 - 7.7 K/uL   Lymphocytes Relative 15 %   Lymphs Abs 1.6 0.7 - 4.0 K/uL   Monocytes Relative 13 %   Monocytes Absolute 1.4 (H) 0.1 - 1.0 K/uL   Eosinophils Relative 1 %   Eosinophils Absolute 0.1 0.0 - 0.5 K/uL   Basophils Relative 1 %   Basophils Absolute 0.1 0.0 - 0.1 K/uL   Immature Granulocytes 1 %   Abs Immature Granulocytes 0.05 0.00 - 0.07 K/uL    Comment: Performed at [facility], [address], [place], [place] [address]  Protime-INR     Status: Abnormal   Collection Time: 02/09/23  8:46 PM  Result Value Ref Range   Prothrombin Time 16.1 (H) 11.4 - 15.2 seconds   INR 1.3 (H) 0.8 - 1.2    Comment: (NOTE) INR goal varies based on device and disease states. Performed at [facility], [address], [place], [place] [address]   Culture, blood (Routine x 2)     Status: None (Preliminary result)   Collection Time: 02/09/23  8:46 PM   Specimen: BLOOD  Result Value Ref Range   Specimen Description BLOOD LEFT ARM    Special Requests      BOTTLES DRAWN AEROBIC AND ANAEROBIC Blood Culture adequate volume   Culture  Setup Time      GRAM POSITIVE COCCI IN BOTH AEROBIC AND ANAEROBIC BOTTLES Performed at [facility], [address], [place], [place] [address]    Culture GRAM POSITIVE COCCI    Report Status PENDING   Brain natriuretic peptide     Status: None   Collection Time: 02/09/23  8:46 PM  Result Value Ref Range   B Natriuretic Peptide 13.3 0.0 - 100.0 pg/mL    Comment: Performed at [facility], [address]  [place], [place] [address]  I-Stat Lactic Acid, ED     Status: Abnormal   Collection Time: 02/09/23  8:49 PM  Result Value Ref Range   Lactic Acid, Venous 2.3 (HH) 0.5 - 1.9 mmol/L   Comment NOTIFIED PHYSICIAN   Culture, blood (Routine x 2)     Status: None (Preliminary result)   Collection Time: 02/09/23  9:08 PM   Specimen: BLOOD  Result Value Ref Range   Specimen Description BLOOD LEFT ARM    Special Requests      BOTTLES DRAWN AEROBIC AND ANAEROBIC Blood Culture adequate volume   Culture  Setup Time      GRAM POSITIVE COCCI ANAEROBIC BOTTLE ONLY Organism ID to follow Performed at [facility], [address], [place], [place] [address]    Culture GRAM POSITIVE COCCI    Report Status PENDING   CK     Status: None   Collection Time: 02/09/23 11:20 PM  Result Value Ref Range   Total CK 141 49 - 397 U/L    Comment: Performed at [facility], [address], [place], [place] [address]  Resp panel by RT-PCR (RSV, Flu A&B, Covid) Anterior Nasal Swab     Status: None   Collection Time: 02/09/23 11:21 PM   Specimen: Anterior Nasal Swab  Result Value Ref Range   SARS Coronavirus 2 by RT PCR NEGATIVE NEGATIVE   Influenza A by PCR NEGATIVE NEGATIVE   Influenza B by PCR NEGATIVE NEGATIVE    Comment: (NOTE) The Xpert Xpress SARS-CoV-2/FLU/RSV plus assay is intended as an aid in the diagnosis of influenza from Nasopharyngeal swab specimens and should not be used as a sole basis for treatment. Nasal washings and aspirates are unacceptable for Xpert Xpress SARS-CoV-2/FLU/RSV testing.  Fact Sheet for Patients: BloggerCourse.com  Fact Sheet for Healthcare Providers: SeriousBroker.it  This test is not yet approved or cleared by the [place] FDA and has been authorized for detection and/or diagnosis of SARS-CoV-2 by FDA under an Emergency Use Authorization (EUA). This EUA will remain in effect (meaning this test can be used)  for the duration of the COVID-19 declaration under Section 564(b)(1) of the Act, 21 U.S.C. section 360bbb-3(b)(1), unless the authorization is terminated or revoked.     Resp Syncytial Virus by PCR NEGATIVE NEGATIVE    Comment: (NOTE) Fact Sheet for Patients: BloggerCourse.com  Fact Sheet for Healthcare Providers: SeriousBroker.it  This test is not yet approved or cleared by the [place] FDA and has been authorized for detection and/or diagnosis of SARS-CoV-2 by FDA under an Emergency Use Authorization (EUA). This EUA will remain in effect (meaning this test can be used) for the duration of the COVID-19 declaration under Section 564(b)(1) of the Act, 21 U.S.C. section 360bbb-3(b)(1), unless the authorization is terminated or revoked.  Performed at [facility], [address] [place], [place] [address]   I-Stat Lactic Acid, ED     Status: None   Collection Time: 02/09/23 11:41 PM  Result Value Ref Range   Lactic Acid, Venous 1.9 0.5 - 1.9 mmol/L  Urinalysis, w/ Reflex to Culture (Infection Suspected) -Urine, Clean Catch     Status: Abnormal   Collection Time: 02/10/23  1:41 AM  Result Value Ref Range   Specimen Source URINE, CATHETERIZED    Color, Urine YELLOW YELLOW   APPearance CLEAR CLEAR   Specific Gravity, Urine 1.011 1.005 - 1.030   pH 7.0 5.0 - 8.0   Glucose, UA NEGATIVE NEGATIVE mg/dL   Hgb urine dipstick MODERATE (A) NEGATIVE   Bilirubin Urine NEGATIVE NEGATIVE   Ketones, ur NEGATIVE NEGATIVE mg/dL  Protein, ur 30 (A) NEGATIVE mg/dL   Nitrite NEGATIVE NEGATIVE   Leukocytes,Ua NEGATIVE NEGATIVE   RBC / HPF 11-20 0 - 5 RBC/hpf   WBC, UA 0-5 0 - 5 WBC/hpf    Comment:        Reflex urine culture not performed if WBC <=10, OR if Squamous epithelial cells >5. If Squamous epithelial cells >5 suggest recollection.    Bacteria, UA NONE SEEN NONE SEEN   Squamous Epithelial / HPF 0-5 0 - 5 /HPF   Mucus  PRESENT     Comment: Performed at [facility], [address] [place], [place] [address]  CBC with Differential/Platelet     Status: Abnormal   Collection Time: 02/10/23  5:07 AM  Result Value Ref Range   WBC 11.5 (H) 4.0 - 10.5 K/uL   RBC 3.71 (L) 4.22 - 5.81 MIL/uL   Hemoglobin 10.9 (L) 13.0 - 17.0 g/dL   HCT 60.4 (L) 54.0 - 98.1 %   MCV 86.5 80.0 - 100.0 fL   MCH 29.4 26.0 - 34.0 pg   MCHC 34.0 30.0 - 36.0 g/dL   RDW 19.1 47.8 - 29.5 %   Platelets 209 150 - 400 K/uL   nRBC 0.0 0.0 - 0.2 %   Neutrophils Relative % 77 %   Neutro Abs 8.8 (H) 1.7 - 7.7 K/uL   Lymphocytes Relative 10 %   Lymphs Abs 1.1 0.7 - 4.0 K/uL   Monocytes Relative 13 %   Monocytes Absolute 1.5 (H) 0.1 - 1.0 K/uL   Eosinophils Relative 0 %   Eosinophils Absolute 0.0 0.0 - 0.5 K/uL   Basophils Relative 0 %   Basophils Absolute 0.0 0.0 - 0.1 K/uL   Immature Granulocytes 0 %   Abs Immature Granulocytes 0.04 0.00 - 0.07 K/uL    Comment: Performed at [facility], [address], [place], [place] [address]  Comprehensive metabolic panel     Status: Abnormal   Collection Time: 02/10/23  5:07 AM  Result Value Ref Range   Sodium 129 (L) 135 - 145 mmol/L   Potassium 3.4 (L) 3.5 - 5.1 mmol/L   Chloride 97 (L) 98 - 111 mmol/L   CO2 22 22 - 32 mmol/L   Glucose, Bld 156 (H) 70 - 99 mg/dL    Comment: Glucose reference range applies only to samples taken after fasting for at least 8 hours.   BUN 17 6 - 20 mg/dL   Creatinine, Ser 8.65 (H) 0.61 - 1.24 mg/dL   Calcium 8.2 (L) 8.9 - 10.3 mg/dL   Total Protein 6.2 (L) 6.5 - 8.1 g/dL   Albumin 2.5 (L) 3.5 - 5.0 g/dL   AST 14 (L) 15 - 41 U/L   ALT 12 0 - 44 U/L   Alkaline Phosphatase 158 (H) 38 - 126 U/L   Total Bilirubin 1.2 0.3 - 1.2 mg/dL   GFR, Estimated 56 (L) >60 mL/min    Comment: (NOTE) Calculated using the CKD-EPI Creatinine Equation (2021)    Anion gap 10 5 - 15    Comment: Performed at [facility], [address], [place], [place]  [address]  Magnesium     Status: Abnormal   Collection Time: 02/10/23  5:07 AM  Result Value Ref Range   Magnesium 1.6 (L) 1.7 - 2.4 mg/dL    Comment: Performed at [facility], [address] [place], [place] [address]  Sedimentation rate     Status: Abnormal  Collection Time: 02/10/23  5:07 AM  Result Value Ref Range   Sed Rate 50 (H) 0 - 16 mm/hr    Comment: Performed at [facility], [address], [place], [place] [address]   DG Chest 2 View  Result Date: 02/09/2023 CLINICAL DATA:  Suspected sepsis. Diagnosis of multiple myeloma last year. No current treatments. EXAM: CHEST - 2 VIEW COMPARISON:  CT chest, abdomen and pelvis 11/21/2020, portable chest 06/12/2022. FINDINGS: There is a tangle of overlying monitor wires. The cardiomediastinal silhouette and vascular pattern are normal. Medially in the right lower lung field there are clustered rounded opacities measuring up 2 cm, with several visible. On the lateral view this appears to be from increased eventration and elevation of the anterior right hemidiaphragm with anterior colonic interposition, and these are probably balls of stool. Remainder of the lungs are clear. There is no substantial pleural effusion. The cardiomediastinal silhouette and vasculature are normal. Chest CT is recommended to confirm a benign process. In all other respects there are no further changes. There is generalized osteopenia. IMPRESSION: 1. Clustered rounded opacities in the medial right lower lung field are probably balls of stool within the interposed hepatic flexure. Chest CT is recommended to confirm a benign process. 2. No evidence of acute chest process. 3. Osteopenia. Electronically Signed   By: Almira Bar M.D.   On: 02/09/2023 22:04    Pending Labs Unresulted Labs (From admission, onward)     Start     Ordered   02/11/23 0500  Renal function panel  Tomorrow morning,   R        02/10/23 1103   02/11/23 0500  Magnesium  Tomorrow morning,    R        02/10/23 1103   02/11/23 0500  CBC with Differential/Platelet  Tomorrow morning,   R        02/10/23 1103   02/10/23 1104  Rapid urine drug screen (hospital performed)  ONCE - STAT,   STAT        02/10/23 1103   02/10/23 1000  C-reactive protein  Once,   AD        02/10/23 1000   02/09/23 2108  Blood Culture ID Panel (Reflexed)  Once,   STAT        02/09/23 2108            Vitals/Pain Today's Vitals   02/10/23 0700 02/10/23 0715 02/10/23 0900 02/10/23 1100  BP: (!) 164/104 (!) 160/102 (!) 159/95 130/80  Pulse: (!) 101 (!) 101 (!) 113 (!) 111  Resp: (!) 22 18 18  (!) 27  Temp:   98.5 F (36.9 C) 98.1 F (36.7 C)  TempSrc:      SpO2: 100% 100% 100% 97%  Weight:      Height:      PainSc:        Isolation Precautions No active isolations  Medications Medications  acetaminophen (TYLENOL) tablet 650 mg (has no administration in time range)    Or  acetaminophen (TYLENOL) suppository 650 mg (has no administration in time range)  melatonin tablet 3 mg (has no administration in time range)  naloxone [facility]) injection 0.4 mg (has no administration in time range)  HYDROmorphone (DILAUDID) injection 0.5 mg (0.5 mg Intravenous Given 02/10/23 0903)  metroNIDAZOLE (FLAGYL) IVPB 500 mg (500 mg Intravenous New Bag/Given 02/10/23 1122)  ceFEPIme (MAXIPIME) 2 g in sodium chloride 0.9 % 100 mL IVPB (0 g Intravenous Stopped 02/10/23 0925)  vancomycin (VANCOREADY)  IVPB 1750 mg/350 mL (has no administration in time range)  acetaminophen (TYLENOL) tablet 1,000 mg (1,000 mg Oral Given 02/09/23 2230)  lactated ringers bolus 1,000 mL (0 mLs Intravenous Stopped 02/10/23 0100)  ceFEPIme (MAXIPIME) 2 g in sodium chloride 0.9 % 100 mL IVPB (0 g Intravenous Stopped 02/09/23 2253)  metroNIDAZOLE (FLAGYL) IVPB 500 mg (0 mg Intravenous Stopped 02/10/23 0045)  HYDROmorphone (DILAUDID) injection 0.5 mg (0.5 mg Intravenous Given 02/09/23 2214)  vancomycin (VANCOREADY) IVPB 2000 mg/400 mL (0 mg  Intravenous Stopped 02/10/23 0426)  HYDROmorphone (DILAUDID) injection 1 mg (1 mg Intravenous Given 02/09/23 2247)  lactated ringers bolus 1,000 mL (0 mLs Intravenous Stopped 02/10/23 0030)  HYDROmorphone (DILAUDID) injection 2 mg (2 mg Intravenous Given 02/10/23 0147)  potassium chloride SA (KLOR-CON M) CR tablet 40 mEq (40 mEq Oral Given 02/10/23 0854)  magnesium sulfate IVPB 2 g 50 mL (0 g Intravenous Stopped 02/10/23 0954)    Mobility walks with device     Focused Assessments Weakness/ infection workup   R Recommendations: See Admitting Provider Note  Report given to:   Additional Notes: Pt is AO, apparently he had refused some testing throughout the night due to pain, he has been kind to me, has on an ankle monitoring bracelet,

## 2023-02-10 NOTE — Progress Notes (Signed)
  Carryover admission to the Day Admitter.  I discussed this case with the EDP, Dr. Blinda Leatherwood.  Per these discussions:   This is a a 42 year old male with history of IV drug abuse, multiple myeloma, bacterial endocarditis, discitis, who was recently in jail until few weeks ago.  Since released from jail he has been using drugs intravenously, now with being admitted with SIRS criteria after presenting with a few days new onset low back pain as well as fever.   In the ED, in addition to objective fever, was found to be mildly tachycardic with a mildly elevated lactic acid level.  He also has leukocytosis.  Chest x-ray showed no evidence of acute purported process, while UA was not consistent with urinary tract infection  EDP has been attempting to pursue additional CT imaging to further evaluate the patient's SIRS criteria in this high risk individual, but has been unable to complete CT imaging due to associated poor pain control in spite of multiple doses of IV Dilaudid.    Blood cultures x 2 were collected and the patient was started on broad-spectrum IV antibiotics in the form of IV vancomycin, cefepime, and IV Flagyl.  I have placed an order for inpatient admission to med/tele for further evaluation management of presenting SIRS.  I have placed some additional preliminary admit orders via the adult multi-morbid admission order set. I have also ordered prn IV Dilaudid, and morning labs in the form of CMP, CBC, and serum magnesium level.  I will defer to the admitting hospitalist decision making regarding additional antibiotics.    Newton Pigg, DO Hospitalist

## 2023-02-10 NOTE — Plan of Care (Signed)
  Problem: Education: Goal: Knowledge of General Education information will improve Description: Including pain rating scale, medication(s)/side effects and non-pharmacologic comfort measures Outcome: Progressing   Problem: Health Behavior/Discharge Planning: Goal: Ability to manage health-related needs will improve Outcome: Progressing   Problem: Elimination: Goal: Will not experience complications related to urinary retention Outcome: Progressing   Problem: Safety: Goal: Ability to remain free from injury will improve Outcome: Progressing

## 2023-02-10 NOTE — ED Provider Notes (Signed)
Signed out to me by Dr. Gwenlyn Fudge.  Patient presented with fever lower back pain.  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  Patient with elevated lactic acid, tachycardia, tachypnea, leukocytosis in the setting of fever.  Broad-spectrum antibiotics ordered for sepsis coverage.  At time of signout, CTs pending to further evaluate for source of infection.  I have had multiple conversations with the patient's, ordered pain medications multiple times.  Patient indicates that he cannot perform his CTs.  Discussed with him that it would literally take 2 minutes to lay on the table and get the scans but he is refusing.  At this point I cannot obtain any further imaging but patient is critically ill with sepsis and requires hospitalization.   Gilda Crease, MD 02/10/23 [phone]

## 2023-02-10 NOTE — Progress Notes (Signed)
Pharmacy Antibiotic Note  Quamane Dearmon NWGNFAOZ III is a 42 y.o. male admitted on 02/09/2023 with sepsis.  Pharmacy has been consulted for cefepime/vancomycin dosing. Also on Flagyl per MD. SCr down to 1.6 (appears near baseline).  Patient already received cefepime 2g IV and vancomycin 2g IV x 1 in the ER.  Plan: Continue cefepime 2g IV q8h Continue vancomycin 1750mg  IV q24h. Goal AUC 400-550. Expected AUC: 477 SCr used: 1.6 Monitor clinical progress, c/s, renal function F/u de-escalation plan/LOT, vancomycin levels as indicated   Height: 5\' 9"  (175.3 cm) Weight: 92 kg (202 lb 13.2 oz) IBW/kg (Calculated) : 70.7  Temp (24hrs), Avg:99.3 F (37.4 C), Min:98.4 F (36.9 C), Max:100.7 F (38.2 C)  Recent Labs  Lab 02/09/23 2046 02/09/23 2049 02/09/23 2341 02/10/23 0507  WBC 10.9*  --   --  11.5*  CREATININE 1.70*  --   --  1.60*  LATICACIDVEN  --  2.3* 1.9  --     Estimated Creatinine Clearance: 68.8 mL/min (A) (by C-G formula based on SCr of 1.6 mg/dL (H)).    Allergies  Allergen Reactions   Sulfa Antibiotics Anaphylaxis   Trazodone And Nefazodone    Zofran Frazier Richards Hcl] Rash    Leia Alf, PharmD, BCPS Please check AMION for all [facility] contact numbers Clinical Pharmacist 02/10/2023 7:38 AM

## 2023-02-10 NOTE — Consult Note (Signed)
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, admitted for constipation/nausea found to have sepsis and mssa bacteremia  Sx at presentation -- 3 weeks LLQ abd pain left leg/hip pain 2 years chronic lower back pain worsened Malaise Intermittent numbness in feet  Ongoing Hampton (meth -- last use 2 weeks prior to admission)   Hx hep c just finished 8 weeks mavyret in June 2024  Plan: Continue vanc Tte  Repeat bcx Repeat hepatitis and hiv screen and rpr/fta for HCM purpose Pelvis and lumbar spine mri Discussed with primary team      ------------------------------------------------ Principal Problem:   Severe sepsis (HCC) Active Problems:   Bipolar I disorder, current or most recent episode depressed, with psychotic features (HCC)   PTSD (post-traumatic stress disorder)   HTN (hypertension)   History of discitis   History of bacteremia   History of osteomyelitis   Depression   History of CVA (cerebrovascular accident)   Polysubstance abuse (HCC)   Lower extremity cellulitis    HPI: Ronald Hampton  Sx of llq abd pain 3 weeks Reports left leg pain from the lower back/hip Intermittent feet numbness, chronic lower back pain but worsened  Walks with fww and previously had used wheel chair as well. Bilateral LE weakness  2 week prior uses iv meth  Hx ie and vertebral om cspine/lumbar spine. Per his report, was in [facility] for 9 months with mrsa infection, IE ?mv and vertebral om. No back surgery. Had stroke there   Fever 100.8 on admission Wbc 11.5 Cr 1.6 Cxr showed: "1. Clustered rounded  opacities in the medial right lower lung field are probably balls of stool within the interposed hepatic flexure. Chest CT is recommended to confirm a benign process. 2. No evidence of acute chest process. 3. 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 No pertinent family history.  Social History   Tobacco Use   Smoking status: Never   Smokeless tobacco: Never  Vaping Use   Vaping status: Never Used  Substance Use Topics   Alcohol use: Not Currently   Drug use: Not Currently    Types: Heroin    Allergies  Allergen Reactions   Sulfa Antibiotics Anaphylaxis   Trazodone And Nefazodone    Zofran [Ondansetron Hcl] Rash    Review of Systems: ROS All Other ROS was negative, except mentioned above   Past Medical History:  Diagnosis Date   Bipolar 1 disorder (HCC)    Depression    PTSD (post-traumatic stress disorder)        Scheduled Meds:  insulin aspart  0-15 Units Subcutaneous TID WC   insulin aspart  0-5 Units Subcutaneous QHS   polyethylene glycol  17 g  Oral BID   senna-docusate  2 tablet Oral BID   Continuous Infusions:  vancomycin     PRN Meds:.acetaminophen **OR** acetaminophen, HYDROmorphone (DILAUDID) injection, melatonin, naLOXone (NARCAN)  injection   OBJECTIVE: Blood pressure (!) 166/94, pulse (!) 116, temperature 98.9 F (37.2 C), temperature source Oral, resp. rate 20, height 5\' 9"  (1.753 m), weight 92 kg, SpO2 100%.  Physical Exam  General/constitutional: no distress, pleasant HEENT: Normocephalic, PER, Conj Clear, EOMI, Oropharynx clear Neck supple CV: rrr no mrg Lungs: clear to auscultation, normal  respiratory effort Abd: Soft, Nontender Ext: no edema Skin: No Rash Neuro: nonfocal MSK: tender lower back and movement of lle, hip rom relatively intact   Lab Results Lab Results  Component Value Date   WBC 11.5 (H) 02/10/2023   HGB 10.9 (L) 02/10/2023   HCT 32.1 (L) 02/10/2023   MCV 86.5 02/10/2023   PLT 209 02/10/2023    Lab Results  Component Value Date   CREATININE 1.60 (H) 02/10/2023   BUN 17 02/10/2023   NA 129 (L) 02/10/2023   K 3.4 (L) 02/10/2023   CL 97 (L) 02/10/2023   CO2 22 02/10/2023    Lab Results  Component Value Date   ALT 12 02/10/2023   AST 14 (L) 02/10/2023   ALKPHOS 158 (H) 02/10/2023   BILITOT 1.2 02/10/2023      Microbiology: Recent Results (from the past 240 hour(s))  Culture, blood (Routine x 2)     Status: None (Preliminary result)   Collection Time: 02/09/23  8:46 PM   Specimen: BLOOD  Result Value Ref Range Status   Specimen Description BLOOD LEFT ARM  Final   Special Requests   Final    BOTTLES DRAWN AEROBIC AND ANAEROBIC Blood Culture adequate volume   Culture  Setup Time   Final    GRAM POSITIVE COCCI IN BOTH AEROBIC AND ANAEROBIC BOTTLES Performed at [facility], [address], [place], [place] [address]    Culture GRAM POSITIVE COCCI  Final   Report Status PENDING  Incomplete  Culture, blood (Routine x 2)     Status: None (Preliminary result)   Collection Time: 02/09/23  9:08 PM   Specimen: BLOOD  Result Value Ref Range Status   Specimen Description BLOOD LEFT ARM  Final   Special Requests   Final    BOTTLES DRAWN AEROBIC AND ANAEROBIC Blood Culture adequate volume   Culture  Setup Time   Final    GRAM POSITIVE COCCI IN BOTH AEROBIC AND ANAEROBIC BOTTLES CRITICAL RESULT CALLED TO, READ BACK BY AND VERIFIED WITH: PHARMD HALEY VON DOHLEN [phone] 1309 BY Berline Chough, [place] Performed at [facility], [address], [place], [place] [address]    Culture GRAM POSITIVE COCCI  Final   Report Status PENDING  Incomplete   Blood Culture ID Panel (Reflexed)     Status: Abnormal   Collection Time: 02/09/23  9:08 PM  Result Value Ref Range Status   Enterococcus faecalis NOT DETECTED NOT DETECTED Final   Enterococcus Faecium NOT DETECTED NOT DETECTED Final   Listeria monocytogenes NOT DETECTED NOT DETECTED Final   Staphylococcus species DETECTED (A) NOT DETECTED Final    Comment: CRITICAL RESULT CALLED TO, READ BACK BY AND VERIFIED WITH: PHARMD HALEY VON DOHLEN [phone] 1309 BY J RAZZAK, [place]    Staphylococcus aureus (BCID) DETECTED (A) NOT DETECTED Final    Comment: Methicillin (oxacillin)-resistant Staphylococcus aureus (MRSA). MRSA is predictably resistant to beta-lactam antibiotics (except ceftaroline). Preferred therapy is  vancomycin unless clinically contraindicated. Patient requires contact precautions if  hospitalized. CRITICAL RESULT CALLED TO, READ BACK BY AND VERIFIED WITH: PHARMD HALEY VON DOHLEN [phone] [id number] BY J RAZZAK, [place]    Staphylococcus epidermidis NOT DETECTED NOT DETECTED Final   Staphylococcus lugdunensis NOT DETECTED NOT DETECTED Final   Streptococcus species NOT DETECTED NOT DETECTED Final   Streptococcus agalactiae NOT DETECTED NOT DETECTED Final   Streptococcus pneumoniae NOT DETECTED NOT DETECTED Final   Streptococcus pyogenes NOT DETECTED NOT DETECTED Final   A.calcoaceticus-baumannii NOT DETECTED NOT DETECTED Final   Bacteroides fragilis NOT DETECTED NOT DETECTED Final   Enterobacterales NOT DETECTED NOT DETECTED Final   Enterobacter cloacae complex NOT DETECTED NOT DETECTED Final   Escherichia coli NOT DETECTED NOT DETECTED Final   Klebsiella aerogenes NOT DETECTED NOT DETECTED Final   Klebsiella oxytoca NOT DETECTED NOT DETECTED Final   Klebsiella pneumoniae NOT DETECTED NOT DETECTED Final   Proteus species NOT DETECTED NOT DETECTED Final   Salmonella species NOT DETECTED NOT DETECTED Final   Serratia marcescens NOT DETECTED NOT DETECTED Final   Haemophilus influenzae NOT  DETECTED NOT DETECTED Final   Neisseria meningitidis NOT DETECTED NOT DETECTED Final   Pseudomonas aeruginosa NOT DETECTED NOT DETECTED Final   Stenotrophomonas maltophilia NOT DETECTED NOT DETECTED Final   Candida albicans NOT DETECTED NOT DETECTED Final   Candida auris NOT DETECTED NOT DETECTED Final   Candida glabrata NOT DETECTED NOT DETECTED Final   Candida krusei NOT DETECTED NOT DETECTED Final   Candida parapsilosis NOT DETECTED NOT DETECTED Final   Candida tropicalis NOT DETECTED NOT DETECTED Final   Cryptococcus neoformans/gattii NOT DETECTED NOT DETECTED Final   Meth resistant mecA/C and MREJ DETECTED (A) NOT DETECTED Final    Comment: CRITICAL RESULT CALLED TO, READ BACK BY AND VERIFIED WITH: PHARMD HALEY VON DOHLEN [phone] 1309 BY Berline Chough, [place] Performed at [facility], [address], [place], [place] [address]   Resp panel by RT-PCR (RSV, Flu A&B, Covid) Anterior Nasal Swab     Status: None   Collection Time: 02/09/23 11:21 PM   Specimen: Anterior Nasal Swab  Result Value Ref Range Status   SARS Coronavirus 2 by RT PCR NEGATIVE NEGATIVE Final   Influenza A by PCR NEGATIVE NEGATIVE Final   Influenza B by PCR NEGATIVE NEGATIVE Final    Comment: (NOTE) The Xpert Xpress SARS-CoV-2/FLU/RSV plus assay is intended as an aid in the diagnosis of influenza from Nasopharyngeal swab specimens and should not be used as a sole basis for treatment. Nasal washings and aspirates are unacceptable for Xpert Xpress SARS-CoV-2/FLU/RSV testing.  Fact Sheet for Patients: BloggerCourse.com  Fact Sheet for Healthcare Providers: SeriousBroker.it  This test is not yet approved or cleared by the [place] FDA and has been authorized for detection and/or diagnosis of SARS-CoV-2 by FDA under an Emergency Use Authorization (EUA). This EUA will remain in effect (meaning this test can be used) for the duration of the COVID-19  declaration under Section 564(b)(1) of the Act, 21 U.S.C. section 360bbb-3(b)(1), unless the authorization is terminated or revoked.     Resp Syncytial Virus by PCR NEGATIVE NEGATIVE Final    Comment: (NOTE) Fact Sheet for Patients: BloggerCourse.com  Fact Sheet for Healthcare Providers: SeriousBroker.it  This test is not yet approved or cleared by the [place] FDA and has been authorized for detection and/or diagnosis of SARS-CoV-2 by FDA under an Emergency Use Authorization (EUA). This EUA will remain in effect (meaning this test can  be used) for the duration of the COVID-19 declaration under Section 564(b)(1) of the Act, 21 U.S.C. section 360bbb-3(b)(1), unless the authorization is terminated or revoked.  Performed at [facility], [address], [place], [place] [address]      Serology:    Imaging: If present, new imagings (plain films, ct scans, and mri) have been personally visualized and interpreted; radiology reports have been reviewed. Decision making incorporated into the Impression / Recommendations.  7/27 cxr 1. Clustered rounded opacities in the medial right lower lung field are probably balls of stool within the interposed hepatic flexure. Chest CT is recommended to confirm a benign process. 2. No evidence of acute chest process. 3. Osteopenia.  Raymondo Band, MD [facility] [phone] pager    02/10/2023, 1:46 PM

## 2023-02-11 ENCOUNTER — Inpatient Hospital Stay (HOSPITAL_COMMUNITY): Payer: No Typology Code available for payment source

## 2023-02-11 DIAGNOSIS — R7881 Bacteremia: Secondary | ICD-10-CM | POA: Diagnosis not present

## 2023-02-11 DIAGNOSIS — A419 Sepsis, unspecified organism: Secondary | ICD-10-CM | POA: Diagnosis not present

## 2023-02-11 DIAGNOSIS — R6520 Severe sepsis without septic shock: Secondary | ICD-10-CM | POA: Diagnosis not present

## 2023-02-11 LAB — RENAL FUNCTION PANEL
Albumin: 2.4 g/dL — ABNORMAL LOW (ref 3.5–5.0)
Anion gap: 7 (ref 5–15)
BUN: 15 mg/dL (ref 6–20)
CO2: 23 mmol/L (ref 22–32)
Calcium: 8.4 mg/dL — ABNORMAL LOW (ref 8.9–10.3)
Chloride: 98 mmol/L (ref 98–111)
Creatinine, Ser: 1.58 mg/dL — ABNORMAL HIGH (ref 0.61–1.24)
GFR, Estimated: 56 mL/min — ABNORMAL LOW (ref >60)
Glucose, Bld: 162 mg/dL — ABNORMAL HIGH (ref 70–99)
Phosphorus: 1.4 mg/dL — ABNORMAL LOW (ref 2.5–4.6)
Potassium: 3.6 mmol/L (ref 3.5–5.1)
Sodium: 128 mmol/L — ABNORMAL LOW (ref 135–145)

## 2023-02-11 LAB — GLUCOSE, CAPILLARY
Glucose-Capillary: 147 mg/dL — ABNORMAL HIGH (ref 70–99)
Glucose-Capillary: 135 mg/dL — ABNORMAL HIGH (ref 70–99)
Glucose-Capillary: 163 mg/dL — ABNORMAL HIGH (ref 70–99)
Glucose-Capillary: 138 mg/dL — ABNORMAL HIGH (ref 70–99)

## 2023-02-11 LAB — CBC WITH DIFFERENTIAL/PLATELET
Abs Immature Granulocytes: 0.1 10*3/uL — ABNORMAL HIGH (ref 0.00–0.07)
Basophils Absolute: 0 10*3/uL (ref 0.0–0.1)
Basophils Relative: 0 %
Eosinophils Absolute: 0.2 10*3/uL (ref 0.0–0.5)
Eosinophils Relative: 1 %
HCT: 30.7 % — ABNORMAL LOW (ref 39.0–52.0)
Hemoglobin: 10.5 g/dL — ABNORMAL LOW (ref 13.0–17.0)
Immature Granulocytes: 1 %
Lymphocytes Relative: 9 %
Lymphs Abs: 1.5 10*3/uL (ref 0.7–4.0)
MCH: 28.8 pg (ref 26.0–34.0)
MCHC: 34.2 g/dL (ref 30.0–36.0)
MCV: 84.3 fL (ref 80.0–100.0)
Monocytes Absolute: 1.7 10*3/uL — ABNORMAL HIGH (ref 0.1–1.0)
Monocytes Relative: 10 %
Neutro Abs: 12.9 10*3/uL — ABNORMAL HIGH (ref 1.7–7.7)
Neutrophils Relative %: 79 %
Platelets: 214 10*3/uL (ref 150–400)
RBC: 3.64 MIL/uL — ABNORMAL LOW (ref 4.22–5.81)
RDW: 13.1 % (ref 11.5–15.5)
WBC: 16.4 10*3/uL — ABNORMAL HIGH (ref 4.0–10.5)
nRBC: 0 % (ref 0.0–0.2)

## 2023-02-11 LAB — HEPATITIS PANEL, ACUTE
HCV Ab: REACTIVE — AB
Hep A IgM: NONREACTIVE
Hep B C IgM: NONREACTIVE
Hepatitis B Surface Ag: NONREACTIVE

## 2023-02-11 LAB — ECHOCARDIOGRAM COMPLETE
AR max vel: 3.44 cm2
AV Peak grad: 6.7 mm[Hg]
Ao pk vel: 1.29 m/s
Area-P 1/2: 3.89 cm2
Height: 69 in
S' Lateral: 2.8 cm
Weight: 3245.17 [oz_av]

## 2023-02-11 LAB — MAGNESIUM: Magnesium: 1.8 mg/dL (ref 1.7–2.4)

## 2023-02-11 LAB — HIV ANTIBODY (ROUTINE TESTING W REFLEX): HIV Screen 4th Generation wRfx: NONREACTIVE

## 2023-02-11 LAB — PHOSPHORUS: Phosphorus: 2.5 mg/dL (ref 2.5–4.6)

## 2023-02-11 LAB — RPR: RPR Ser Ql: NONREACTIVE

## 2023-02-11 MED ORDER — K PHOS MONO-SOD PHOS DI & MONO 155-852-130 MG PO TABS
250.0000 mg | ORAL_TABLET | Freq: Three times a day (TID) | ORAL | Status: AC
  Administered 2023-02-11 – 2023-02-12 (×6): 250 mg via ORAL
  Filled 2023-02-11 (×6): qty 1

## 2023-02-11 MED ORDER — POTASSIUM PHOSPHATES 15 MMOLE/5ML IV SOLN
30.0000 mmol | Freq: Once | INTRAVENOUS | Status: AC
  Administered 2023-02-11: 30 mmol via INTRAVENOUS
  Filled 2023-02-11: qty 10

## 2023-02-11 NOTE — Progress Notes (Signed)
Echocardiogram 2D Echocardiogram has been performed.  Ronald Hampton 02/11/2023, 3:07 PM

## 2023-02-11 NOTE — Progress Notes (Signed)
PROGRESS NOTE Ronald Hampton  ZOX:[id number] DOB: [birth year] DOA: 02/09/2023 PCP: Patient, No Pcp Per  Brief Narrative/Hospital Course: 43 y.o. male with PMH of multiple myeloma, CKD, depression, PTSD, bipolar disorder, CVA, HTN, polysubstance use/IVDU, DM-2, endocarditis, osteomyelitis and discitis presented with 3 weeks of left lower quadrant abdominal pain, left leg pain hip pain, 2 years of chronic low back pain that worsened, was having malaise, intermittent numbness in feet, ongoing IVDU methamphetamine last used 2 weeks prior to admission.   Patient is not a great historian.  He reports coming to the hospital due to difficulty using the bathroom for 3 days. He also reports associated nausea.  No other complaints until specifically asked for symptoms.  He admits to fever with a temperature of 100.8 F yesterday.  He also admits to abdominal pain over LLQ for 2 to 3 weeks.  He admits to lower back pain for 2 years without acute change.  He reports generalized weakness for about a week.  He reports about 7 falls from feeling dizzy both spinning and lightheadedness.  He reports numbness on and off in both legs.  He reports taking pain medication for his pain.  He reports injecting meth.  He states he last injected about 2 weeks ago.  He also reports a skin rash on his abdomen.  He denies headache or neck stiff neck.  He denies smoking cigarettes or drinking alcohol.  He likes to have cardiopulmonary resuscitation in an event of sudden cardiopulmonary arrest.   In ED, septic with fever, tachycardia, tachypnea.  Normotensive. Na 129.  K3.4. Cr 1.6.  BUN 17.  Mg 1.6.  LFT normal.  WBC 11.5.  Hgb 10.9.  CK1 41.  Lactic acid 1.9.  Influenza, COVID-19 and RSV nonreactive.  UA without significant finding.  Chest x-ray with clustered rounded opacities in the RML felt to be balls of stool within interposed hepatic flexure.  CT chest, abdomen and pelvis as well as left femur ordered but unable to complete  as patient refused CT due to pain despite multiple rounds of IV Dilaudid.  ID was consulted and admitted for further management    Subjective: Seen and examine Overnight mild tachycardia 100, afebrile, BP stable Labs reviewed-sodium at 128, creatinine at 1.5 more or less the same post 1.4 x 1.8 CBC with leukocytosis, last CRP 18.4, UDS positive for amphetamine Agreeable for mri pending removal of monitoring device on rt leg C/o pain in hip and weakness which is chronic   Assessment and Plan: Principal Problem:   Severe sepsis (HCC) Active Problems:   History of discitis   History of bacteremia   History of osteomyelitis   Depression   History of CVA (cerebrovascular accident)   Polysubstance abuse (HCC)   Bipolar I disorder, current or most recent episode depressed, with psychotic features (HCC)   PTSD (post-traumatic stress disorder)   HTN (hypertension)   Lower extremity cellulitis  Severe sepsis due to bilateral lower extremity cellulitis POA Leukocytosis lactic acidosis: History of IE/osteomyelitis and discitis with question of functional paraplegia: MSSA bacteremia: Positive BC ID panel follow-up culture data: ID following closely given history of previous IV, IVDA is concerning, continue on vancomycin.  MRI pelvis and lumbar spine ordered per ID. HE REFUSED CT chest, abdomen, pelvis and left leg that was ordered in ED and did not cooperate. Recent Labs  Lab 02/09/23 2046 02/09/23 2049 02/09/23 2341 02/10/23 0507 02/11/23 0316  WBC 10.9*  --   --  11.5* 16.4*  LATICACIDVEN  --  2.3* 1.9  --   --      Constipation/nausea: Symptomatic management.   Bilateral lower extremity weakness/recurrent fall: Patient close chronic back pain over 2 years unchanged from baseline.  Has history of discitis/osteomyelitis.  Awaiting for MRI-he rfeused C in ED. Continue antibiotics regimen as per ID.  Continue pain control, continue PT OT   CKD-3A: At baseline. Hypophosphatemia will  replace IV n.p.o. Hypokalemia/hypomagnesemia-resolved  Depression/BPD/PTSD/OCD: Stable.  Denies SI or HI. Cont home    Hyponatremia: Sodium overall stable see below Recent Labs  Lab 02/09/23 2046 02/10/23 0507 02/11/23 0316  NA 129* 129* 128*   IVDA-cessation advised  DVT prophylaxis: SCDs Start: 02/10/23 0411 Code Status:   Code Status: Full Code Family Communication: plan of care discussed with patient at bedside. Patient status is:  inpatient because of sepsis Level of care: Telemetry Medical   Dispo: The patient is from: home            Anticipated disposition: TBD Objective: Vitals last 24 hrs: Vitals:   02/10/23 1616 02/10/23 2027 02/11/23 0503 02/11/23 0840  BP: (!) 151/87 125/81 130/88 132/72  Pulse: (!) 107 (!) 103 (!) 108 (!) 107  Resp: 19 16 16 18   Temp: 99.7 F (37.6 C) 98.2 F (36.8 C) 99.4 F (37.4 C) 98.9 F (37.2 C)  TempSrc: Oral Oral Oral   SpO2: 99% 98% 100% 96%  Weight:      Height:       Weight change:   Physical Examination: General exam: alert awake, older than stated age HEENT:Oral mucosa moist, Ear/Nose WNL grossly Respiratory system: bilaterally CLEAR BS, no use of accessory muscle Cardiovascular system: S1 & S2 +, No JVD. Gastrointestinal system: Abdomen soft,NT,ND, BS+ Nervous System:Alert, awake, moving extremities. Extremities: LE edema + B/L LEG,distal peripheral pulses palpable.  Skin: No rashes,no icterus. MSK: Normal muscle bulk,tone, power  Medications reviewed:  Scheduled Meds:  insulin aspart  0-15 Units Subcutaneous TID WC   insulin aspart  0-5 Units Subcutaneous QHS   phosphorus  250 mg Oral TID   polyethylene glycol  17 g Oral BID   senna-docusate  2 tablet Oral BID   Continuous Infusions:  sodium chloride 10 mL/hr at 02/10/23 2257   potassium PHOSPHATE IVPB (in mmol) 30 mmol (02/11/23 1035)   vancomycin 1,750 mg (02/10/23 2254)   Diet Order             Diet regular Room service appropriate? Yes; Fluid  consistency: Thin  Diet effective now                  Intake/Output Summary (Last 24 hours) at 02/11/2023 1037 Last data filed at 02/11/2023 0800 Gross per 24 hour  Intake 1049.97 ml  Output 1000 ml  Net 49.97 ml   Net IO Since Admission: -853.02 mL [02/11/23 1037]  Wt Readings from Last 3 Encounters:  02/09/23 92 kg  06/11/22 92 kg  11/10/17 107 kg     Unresulted Labs (From admission, onward)     Start     Ordered   02/11/23 2000  Phosphorus  (Serum Phosphorus)  Once-Timed,   TIMED       Question:  Specimen collection method  Answer:  Lab=Lab collect   02/11/23 0851   02/11/23 0500  HIV Antibody (routine testing w rflx)  (HIV Antibody (Routine testing w reflex) panel)  Tomorrow morning,   R       Question:  Specimen collection method  Answer:  Lab=Lab collect  02/10/23 1402   02/11/23 0500  RPR  Tomorrow morning,   R       Question:  Specimen collection method  Answer:  Lab=Lab collect   02/10/23 1402   02/11/23 0500  Fluorescent treponemal ab(fta)-IgG-bld  Tomorrow morning,   R       Question:  Specimen collection method  Answer:  Lab=Lab collect   02/10/23 1402   02/10/23 1309  Hemoglobin A1c  Once,   R       Comments: To assess prior glycemic control   Question:  Specimen collection method  Answer:  Lab=Lab collect   02/10/23 1308          Data Reviewed: I have personally reviewed following labs and imaging studies CBC: Recent Labs  Lab 02/09/23 2046 02/10/23 0507 02/11/23 0316  WBC 10.9* 11.5* 16.4*  NEUTROABS 7.8* 8.8* 12.9*  HGB 12.4* 10.9* 10.5*  HCT 36.5* 32.1* 30.7*  MCV 87.3 86.5 84.3  PLT 305 209 214   Basic Metabolic Panel: Recent Labs  Lab 02/09/23 2046 02/10/23 0507 02/11/23 0316  NA 129* 129* 128*  K 4.0 3.4* 3.6  CL 96* 97* 98  CO2 21* 22 23  GLUCOSE 169* 156* 162*  BUN 18 17 15   CREATININE 1.70* 1.60* 1.58*  CALCIUM 9.2 8.2* 8.4*  MG  --  1.6* 1.8  PHOS  --   --  1.4*   GFR: Estimated Creatinine Clearance: 69.6 mL/min  (A) (by C-G formula based on SCr of 1.58 mg/dL (H)). Liver Function Tests: Recent Labs  Lab 02/09/23 2046 02/10/23 0507 02/11/23 0316  AST 19 14*  --   ALT 14 12  --   ALKPHOS 199* 158*  --   BILITOT 0.8 1.2  --   PROT 8.1 6.2*  --   ALBUMIN 3.5 2.5* 2.4*   Recent Labs  Lab 02/09/23 2046  INR 1.3*  CBG: Recent Labs  Lab 02/10/23 1701 02/10/23 2029 02/11/23 0724  GLUCAP 154* 148* 147*   Recent Labs  Lab 02/09/23 2049 02/09/23 2341  LATICACIDVEN 2.3* 1.9   Recent Results (from the past 240 hour(s))  Culture, blood (Routine x 2)     Status: None (Preliminary result)   Collection Time: 02/09/23  8:46 PM   Specimen: BLOOD  Result Value Ref Range Status   Specimen Description BLOOD LEFT ARM  Final   Special Requests   Final    BOTTLES DRAWN AEROBIC AND ANAEROBIC Blood Culture adequate volume   Culture  Setup Time   Final    GRAM POSITIVE COCCI IN BOTH AEROBIC AND ANAEROBIC BOTTLES Performed at [facility], [address], [place], [place] [address]    Culture GRAM POSITIVE COCCI  Final   Report Status PENDING  Incomplete  Culture, blood (Routine x 2)     Status: Abnormal (Preliminary result)   Collection Time: 02/09/23  9:08 PM   Specimen: BLOOD  Result Value Ref Range Status   Specimen Description BLOOD LEFT ARM  Final   Special Requests   Final    BOTTLES DRAWN AEROBIC AND ANAEROBIC Blood Culture adequate volume   Culture  Setup Time   Final    GRAM POSITIVE COCCI IN BOTH AEROBIC AND ANAEROBIC BOTTLES CRITICAL RESULT CALLED TO, READ BACK BY AND VERIFIED WITH: PHARMD HALEY VON DOHLEN [phone] 1309 BY J RAZZAK, [place]    Culture (A)  Final    STAPHYLOCOCCUS AUREUS SUSCEPTIBILITIES TO FOLLOW Performed at [facility], [address]  [place], [place] [address]    Report Status PENDING  Incomplete  Blood Culture ID Panel (Reflexed)     Status: Abnormal   Collection Time: 02/09/23  9:08 PM  Result Value Ref Range Status   Enterococcus faecalis NOT  DETECTED NOT DETECTED Final   Enterococcus Faecium NOT DETECTED NOT DETECTED Final   Listeria monocytogenes NOT DETECTED NOT DETECTED Final   Staphylococcus species DETECTED (A) NOT DETECTED Final    Comment: CRITICAL RESULT CALLED TO, READ BACK BY AND VERIFIED WITH: PHARMD HALEY VON DOHLEN [phone] 1309 BY J RAZZAK, MT    Staphylococcus aureus (BCID) DETECTED (A) NOT DETECTED Final    Comment: Methicillin (oxacillin)-resistant Staphylococcus aureus (MRSA). MRSA is predictably resistant to beta-lactam antibiotics (except ceftaroline). Preferred therapy is vancomycin unless clinically contraindicated. Patient requires contact precautions if  hospitalized. CRITICAL RESULT CALLED TO, READ BACK BY AND VERIFIED WITH: PHARMD HALEY VON DOHLEN [phone] 1309 BY J RAZZAK, MT    Staphylococcus epidermidis NOT DETECTED NOT DETECTED Final   Staphylococcus lugdunensis NOT DETECTED NOT DETECTED Final   Streptococcus species NOT DETECTED NOT DETECTED Final   Streptococcus agalactiae NOT DETECTED NOT DETECTED Final   Streptococcus pneumoniae NOT DETECTED NOT DETECTED Final   Streptococcus pyogenes NOT DETECTED NOT DETECTED Final   A.calcoaceticus-baumannii NOT DETECTED NOT DETECTED Final   Bacteroides fragilis NOT DETECTED NOT DETECTED Final   Enterobacterales NOT DETECTED NOT DETECTED Final   Enterobacter cloacae complex NOT DETECTED NOT DETECTED Final   Escherichia coli NOT DETECTED NOT DETECTED Final   Klebsiella aerogenes NOT DETECTED NOT DETECTED Final   Klebsiella oxytoca NOT DETECTED NOT DETECTED Final   Klebsiella pneumoniae NOT DETECTED NOT DETECTED Final   Proteus species NOT DETECTED NOT DETECTED Final   Salmonella species NOT DETECTED NOT DETECTED Final   Serratia marcescens NOT DETECTED NOT DETECTED Final   Haemophilus influenzae NOT DETECTED NOT DETECTED Final   Neisseria meningitidis NOT DETECTED NOT DETECTED Final   Pseudomonas aeruginosa NOT DETECTED NOT DETECTED Final    Stenotrophomonas maltophilia NOT DETECTED NOT DETECTED Final   Candida albicans NOT DETECTED NOT DETECTED Final   Candida auris NOT DETECTED NOT DETECTED Final   Candida glabrata NOT DETECTED NOT DETECTED Final   Candida krusei NOT DETECTED NOT DETECTED Final   Candida parapsilosis NOT DETECTED NOT DETECTED Final   Candida tropicalis NOT DETECTED NOT DETECTED Final   Cryptococcus neoformans/gattii NOT DETECTED NOT DETECTED Final   Meth resistant mecA/C and MREJ DETECTED (A) NOT DETECTED Final    Comment: CRITICAL RESULT CALLED TO, READ BACK BY AND VERIFIED WITH: PHARMD HALEY VON DOHLEN [phone] 1309 BY Berline Chough, MT Performed at [facility], [address] [place], [place] [address]   Resp panel by RT-PCR (RSV, Flu A&B, Covid) Anterior Nasal Swab     Status: None   Collection Time: 02/09/23 11:21 PM   Specimen: Anterior Nasal Swab  Result Value Ref Range Status   SARS Coronavirus 2 by RT PCR NEGATIVE NEGATIVE Final   Influenza A by PCR NEGATIVE NEGATIVE Final   Influenza B by PCR NEGATIVE NEGATIVE Final    Comment: (NOTE) The Xpert Xpress SARS-CoV-2/FLU/RSV plus assay is intended as an aid in the diagnosis of influenza from Nasopharyngeal swab specimens and should not be used as a sole basis for treatment. Nasal washings and aspirates are unacceptable for Xpert Xpress SARS-CoV-2/FLU/RSV testing.  Fact Sheet for Patients: BloggerCourse.com  Fact Sheet for Healthcare Providers: SeriousBroker.it  This test is not yet approved or cleared by  the [employer] and has been authorized for detection and/or diagnosis of SARS-CoV-2 by FDA under an Emergency Use Authorization (EUA). This EUA will remain in effect (meaning this test can be used) for the duration of the COVID-19 declaration under Section 564(b)(1) of the Act, 21 U.S.C. section 360bbb-3(b)(1), unless the authorization is terminated or revoked.     Resp Syncytial  Virus by PCR NEGATIVE NEGATIVE Final    Comment: (NOTE) Fact Sheet for Patients: BloggerCourse.com  Fact Sheet for Healthcare Providers: SeriousBroker.it  This test is not yet approved or cleared by the [place] FDA and has been authorized for detection and/or diagnosis of SARS-CoV-2 by FDA under an Emergency Use Authorization (EUA). This EUA will remain in effect (meaning this test can be used) for the duration of the COVID-19 declaration under Section 564(b)(1) of the Act, 21 U.S.C. section 360bbb-3(b)(1), unless the authorization is terminated or revoked.  Performed at [facility], [address] [place], [place] [address]     Antimicrobials: Anti-infectives (From admission, onward)    Start     Dose/Rate Route Frequency Ordered Stop   02/10/23 2200  vancomycin (VANCOREADY) IVPB 1750 mg/350 mL  Status:  Discontinued        1,750 mg 175 mL/hr over 120 Minutes Intravenous Every 24 hours 02/10/23 0754 02/10/23 1355   02/10/23 2200  vancomycin (VANCOREADY) IVPB 1750 mg/350 mL        1,750 mg 175 mL/hr over 120 Minutes Intravenous Every 24 hours 02/10/23 1402     02/10/23 1500  ceFAZolin (ANCEF) IVPB 2g/100 mL premix  Status:  Discontinued        2 g 200 mL/hr over 30 Minutes Intravenous Every 8 hours 02/10/23 1405 02/10/23 1413   02/10/23 1000  metroNIDAZOLE (FLAGYL) IVPB 500 mg  Status:  Discontinued        500 mg 100 mL/hr over 60 Minutes Intravenous Every 12 hours 02/10/23 0728 02/10/23 1315   02/10/23 0800  ceFEPIme (MAXIPIME) 2 g in sodium chloride 0.9 % 100 mL IVPB  Status:  Discontinued        2 g 200 mL/hr over 30 Minutes Intravenous Every 8 hours 02/10/23 0754 02/10/23 1315   02/09/23 2130  ceFEPIme (MAXIPIME) 2 g in sodium chloride 0.9 % 100 mL IVPB        2 g 200 mL/hr over 30 Minutes Intravenous  Once 02/09/23 2121 02/09/23 2253   02/09/23 2130  metroNIDAZOLE (FLAGYL) IVPB 500 mg        500 mg 100  mL/hr over 60 Minutes Intravenous  Once 02/09/23 2121 02/10/23 0045   02/09/23 2130  vancomycin (VANCOCIN) IVPB 1000 mg/200 mL premix  Status:  Discontinued        1,000 mg 200 mL/hr over 60 Minutes Intravenous  Once 02/09/23 2121 02/09/23 2123   02/09/23 2130  vancomycin (VANCOREADY) IVPB 2000 mg/400 mL        2,000 mg 200 mL/hr over 120 Minutes Intravenous  Once 02/09/23 2123 02/10/23 0426      Culture/Microbiology    Component Value Date/Time   SDES BLOOD LEFT ARM 02/09/2023 2108   SPECREQUEST  02/09/2023 2108    BOTTLES DRAWN AEROBIC AND ANAEROBIC Blood Culture adequate volume   CULT (A) 02/09/2023 2108    STAPHYLOCOCCUS AUREUS SUSCEPTIBILITIES TO FOLLOW Performed at [facility], [address] [place], [place] [address]    REPTSTATUS PENDING 02/09/2023 2108    Radiology Studies: [facility] Chest 2 View  Result  Date: 02/09/2023 CLINICAL DATA:  Suspected sepsis. Diagnosis of multiple myeloma last year. No current treatments. EXAM: CHEST - 2 VIEW COMPARISON:  CT chest, abdomen and pelvis 11/21/2020, portable chest 06/12/2022. FINDINGS: There is a tangle of overlying monitor wires. The cardiomediastinal silhouette and vascular pattern are normal. Medially in the right lower lung field there are clustered rounded opacities measuring up 2 cm, with several visible. On the lateral view this appears to be from increased eventration and elevation of the anterior right hemidiaphragm with anterior colonic interposition, and these are probably balls of stool. Remainder of the lungs are clear. There is no substantial pleural effusion. The cardiomediastinal silhouette and vasculature are normal. Chest CT is recommended to confirm a benign process. In all other respects there are no further changes. There is generalized osteopenia. IMPRESSION: 1. Clustered rounded opacities in the medial right lower lung field are probably balls of stool within the interposed hepatic flexure. Chest CT is recommended  to confirm a benign process. 2. No evidence of acute chest process. 3. Osteopenia. Electronically Signed   By: Almira Bar M.D.   On: 02/09/2023 22:04     LOS: 1 day   Lanae Boast, MD [facility]  02/11/2023, 10:37 AM

## 2023-02-11 NOTE — Progress Notes (Signed)
OT Cancellation Note  Patient Details Name: Ronald Hampton MRN: [id number] DOB: Nov 28, 1980   Cancelled Treatment:    Reason Eval/Treat Not Completed: Patient declined, no reason specified. Patient wanting therapy to hold until imaging completed 2/2 patient reporting that he can not bear weight through L LE without pain.  Denice Paradise 02/11/2023, 2:45 PM

## 2023-02-11 NOTE — Hospital Course (Addendum)
42 y.o. male with PMH of multiple myeloma, CKD, depression, PTSD, bipolar disorder, CVA, HTN, polysubstance use/IVDU, DM-2, endocarditis, osteomyelitis and discitis presented with 3 weeks of left lower quadrant abdominal pain, left leg pain hip pain, 2 years of chronic low back pain that worsened, was having malaise, intermittent numbness in feet, ongoing IVDU methamphetamine last used 2 weeks prior to admission.   Patient is not a great historian.  He reports coming to the hospital due to difficulty using the bathroom for 3 days. He also reports associated nausea.  No other complaints until specifically asked for symptoms.  He admits to fever with a temperature of 100.8 F yesterday.  He also admits to abdominal pain over LLQ for 2 to 3 weeks.  He admits to lower back pain for 2 years without acute change.  He reports generalized weakness for about a week.  He reports about 7 falls from feeling dizzy both spinning and lightheadedness.  He reports numbness on and off in both legs.  He reports taking pain medication for his pain.  He reports injecting meth.  He states he last injected about 2 weeks ago.  He also reports a skin rash on his abdomen.  He denies headache or neck stiff neck.  He denies smoking cigarettes or drinking alcohol.  He likes to have cardiopulmonary resuscitation in an event of sudden cardiopulmonary arrest.   In ED, septic with fever, tachycardia, tachypnea.  Normotensive. Na 129.  K3.4. Cr 1.6.  BUN 17.  Mg 1.6.  LFT normal.  WBC 11.5.  Hgb 10.9.  CK1 41.  Lactic acid 1.9.  Influenza, COVID-19 and RSV nonreactive.  UA without significant finding.  Chest x-ray with clustered rounded opacities in the RML felt to be balls of stool within interposed hepatic flexure.  CT chest, abdomen and pelvis as well as left femur ordered but unable to complete as patient refused CT due to pain despite multiple rounds of IV Dilaudid.  ID was consulted and admitted for further management

## 2023-02-11 NOTE — Progress Notes (Signed)
PT Cancellation Note  Patient Details Name: Ronald Hampton MRN: [id number] DOB: 12-Aug-1980   Cancelled Treatment:    Reason Eval/Treat Not Completed: Pain limiting ability to participate. PT attempts to see pt, pt initially agreeable to mobilizing after receiving pain medication. RN provides pt with IV pain medication and PT returns 10 minutes after. Pt then refuses mobility, reports he wants to have imaging done prior to mobilizing. PT will follow up tomorrow.   Arlyss Gandy 02/11/2023, 10:21 AM

## 2023-02-12 DIAGNOSIS — A419 Sepsis, unspecified organism: Secondary | ICD-10-CM | POA: Diagnosis not present

## 2023-02-12 DIAGNOSIS — R6520 Severe sepsis without septic shock: Secondary | ICD-10-CM | POA: Diagnosis not present

## 2023-02-12 LAB — COMPREHENSIVE METABOLIC PANEL
ALT: 10 U/L (ref 0–44)
AST: 10 U/L — ABNORMAL LOW (ref 15–41)
Albumin: 2.3 g/dL — ABNORMAL LOW (ref 3.5–5.0)
Alkaline Phosphatase: 153 U/L — ABNORMAL HIGH (ref 38–126)
Anion gap: 8 (ref 5–15)
BUN: 11 mg/dL (ref 6–20)
CO2: 23 mmol/L (ref 22–32)
Calcium: 8.1 mg/dL — ABNORMAL LOW (ref 8.9–10.3)
Chloride: 98 mmol/L (ref 98–111)
Creatinine, Ser: 1.34 mg/dL — ABNORMAL HIGH (ref 0.61–1.24)
GFR, Estimated: >60 mL/min (ref >60)
Glucose, Bld: 196 mg/dL — ABNORMAL HIGH (ref 70–99)
Potassium: 4 mmol/L (ref 3.5–5.1)
Sodium: 129 mmol/L — ABNORMAL LOW (ref 135–145)
Total Bilirubin: 0.8 mg/dL (ref 0.3–1.2)
Total Protein: 6.3 g/dL — ABNORMAL LOW (ref 6.5–8.1)

## 2023-02-12 LAB — GLUCOSE, CAPILLARY
Glucose-Capillary: 152 mg/dL — ABNORMAL HIGH (ref 70–99)
Glucose-Capillary: 140 mg/dL — ABNORMAL HIGH (ref 70–99)
Glucose-Capillary: 139 mg/dL — ABNORMAL HIGH (ref 70–99)
Glucose-Capillary: 148 mg/dL — ABNORMAL HIGH (ref 70–99)

## 2023-02-12 LAB — T.PALLIDUM AB, TOTAL: T Pallidum Abs: NONREACTIVE

## 2023-02-12 LAB — CBC
HCT: 32.5 % — ABNORMAL LOW (ref 39.0–52.0)
Hemoglobin: 11.2 g/dL — ABNORMAL LOW (ref 13.0–17.0)
MCH: 29.2 pg (ref 26.0–34.0)
MCHC: 34.5 g/dL (ref 30.0–36.0)
MCV: 84.6 fL (ref 80.0–100.0)
Platelets: 229 10*3/uL (ref 150–400)
RBC: 3.84 MIL/uL — ABNORMAL LOW (ref 4.22–5.81)
RDW: 13.2 % (ref 11.5–15.5)
WBC: 14.9 10*3/uL — ABNORMAL HIGH (ref 4.0–10.5)
nRBC: 0 % (ref 0.0–0.2)

## 2023-02-12 LAB — CULTURE, BLOOD (ROUTINE X 2): Special Requests: ADEQUATE

## 2023-02-12 NOTE — Progress Notes (Signed)
PT Cancellation Note  Patient Details Name: Ronald Hampton MRN: [id number] DOB: 1981/07/12   Cancelled Treatment:    Reason Eval/Treat Not Completed: Other (comment). Pt continues to decline participation in therapy evals, stating he wants to wait until MRIs are completed. He is unable to proceed with MRI until his ankle monitor is removed.   Ilda Foil 02/12/2023, 10:14 AM

## 2023-02-12 NOTE — Progress Notes (Signed)
PROGRESS NOTE Ronald Hampton [place]  VQQ:[id number] DOB: [birth year] DOA: 02/09/2023 PCP: Patient, No Pcp Per  Brief Narrative/Hospital Course: 42 y.o. male with PMH of multiple myeloma, CKD, depression, PTSD, bipolar disorder, CVA, HTN, polysubstance use/IVDU, DM-2, endocarditis, osteomyelitis and discitis presented with 3 weeks of left lower quadrant abdominal pain, left leg pain hip pain, 2 years of chronic low back pain that worsened, was having malaise, intermittent numbness in feet, ongoing IVDU methamphetamine last used 2 weeks prior to admission.   Patient is not a great historian.  He reports coming to the hospital due to difficulty using the bathroom for 3 days. He also reports associated nausea.  No other complaints until specifically asked for symptoms.  He admits to fever with a temperature of 100.8 F yesterday.  He also admits to abdominal pain over LLQ for 2 to 3 weeks.  He admits to lower back pain for 2 years without acute change.  He reports generalized weakness for about a week.  He reports about 7 falls from feeling dizzy both spinning and lightheadedness.  He reports numbness on and off in both legs.  He reports taking pain medication for his pain.  He reports injecting meth.  He states he last injected about 2 weeks ago.  He also reports a skin rash on his abdomen.  He denies headache or neck stiff neck.  He denies smoking cigarettes or drinking alcohol.  He likes to have cardiopulmonary resuscitation in an event of sudden cardiopulmonary arrest.   In ED, septic with fever, tachycardia, tachypnea.  Normotensive. Na 129.  K3.4. Cr 1.6.  BUN 17.  Mg 1.6.  LFT normal.  WBC 11.5.  Hgb 10.9.  CK1 41.  Lactic acid 1.9.  Influenza, COVID-19 and RSV nonreactive.  UA without significant finding.  Chest x-ray with clustered rounded opacities in the RML felt to be balls of stool within interposed hepatic flexure.  CT chest, abdomen and pelvis as well as left femur ordered but unable to complete  as patient refused CT due to pain despite multiple rounds of IV Dilaudid.  ID was consulted and admitted for further management    Subjective: Patient seen and examined Overnight afebrile mild tachycardia, not hypoxic Labs shows creatinine improving at 1.3 WBC downtrending Waiting for MRI of pelvis and lumbar spine-he states he just spoke to his probation officer who are on the way to remove the monitoring device Complains of spasm pain on left hip and unable to bear weight on the left side  Assessment and Plan: Principal Problem:   Severe sepsis (HCC) Active Problems:   History of discitis   History of bacteremia   History of osteomyelitis   Depression   History of CVA (cerebrovascular accident)   Polysubstance abuse (HCC)   Bipolar I disorder, current or most recent episode depressed, with psychotic features (HCC)   PTSD (post-traumatic stress disorder)   HTN (hypertension)   Lower extremity cellulitis  Severe sepsis due to bilateral lower extremity cellulitis POA Leukocytosis lactic acidosis: History of IE/osteomyelitis and discitis with question of functional paraplegia: MSSA bacteremia this admission.ID following given history of previous IV, IVDA is concernin 2D echo 7/29 with EF 65 to 70% no RWMA normal RV systolic function mitral valve is normal no regurgitation aortic valve is normal in structure Continue on vancomycin.  MRI pelvis and lumbar spine PENDING. He refused CT chest, abdomen, pelvis and left leg in ED. but is agreeable for MRI once monitoring device in his leg is removed by  his Engineer, drilling. Recent Labs  Lab 02/09/23 2046 02/09/23 2049 02/09/23 2341 02/10/23 0507 02/11/23 0316 02/12/23 0014  WBC 10.9*  --   --  11.5* 16.4* 14.9*  LATICACIDVEN  --  2.3* 1.9  --   --   --     Constipation/nausea:Continue stool softener/laxatives    Bilateral lower extremity weakness/recurrent fall Difficulty moving weight on the left side: Patient  has chronic  back pain over 2 years unchanged from baseline.Has history of discitis/osteomyelitis.Awaiting for MRI-he refused CTs in ED. Continue antibiotics regimen as per ID.  Continue pain control, continue PT OT.   CKD-3A: At baseline. Hypophosphatemia resolved Hypokalemia/hypomagnesemia-resolved  Depression/BPD/PTSD/OCD: Stable.Denies SI or HI. Cont home    Hyponatremia: Level is stable at 129 range.Monitor  Recent Labs  Lab 02/09/23 2046 02/10/23 0507 02/11/23 0316 02/12/23 0014  NA 129* 129* 128* 129*   IVDA:cessation advised  DVT prophylaxis: SCDs Start: 02/10/23 0411 Code Status:   Code Status: Full Code Family Communication: plan of care discussed with patient at bedside.  Patient status is:  inpatient because of sepsis Level of care: Telemetry Medical  Dispo: The patient is from: home            Anticipated disposition: TBD Objective: Vitals last 24 hrs: Vitals:   02/11/23 1642 02/11/23 2014 02/12/23 0500 02/12/23 0511  BP: 124/84 (!) 144/92  135/85  Pulse: 99 (!) 108  98  Resp: 19 19  19   Temp: 98.3 F (36.8 C) 98.5 F (36.9 C)  98.4 F (36.9 C)  TempSrc:      SpO2: 97% 99%  100%  Weight:   93 kg   Height:       Weight change:   Physical Examination: General exam: alert awake HEENT:Oral mucosa moist, Ear/Nose WNL grossly Respiratory system: Bilaterally clear BS,no use of accessory muscle Cardiovascular system: S1 & S2 +, No JVD. Gastrointestinal system: Abdomen soft,NT,ND, BS+ Nervous System: Alert, awake,left leg difficulty to move Extremities: LE edema neg,distal peripheral pulses palpable and warm.  Skin: No rashes,no icterus. MSK: Normal muscle bulk,tone, power   Medications reviewed:  Scheduled Meds:  insulin aspart  0-15 Units Subcutaneous TID WC   insulin aspart  0-5 Units Subcutaneous QHS   phosphorus  250 mg Oral TID   polyethylene glycol  17 g Oral BID   senna-docusate  2 tablet Oral BID   Continuous Infusions:  sodium chloride 10 mL/hr at  02/10/23 2257   vancomycin 1,750 mg (02/11/23 2207)   Diet Order             Diet regular Room service appropriate? Yes; Fluid consistency: Thin  Diet effective now                  Intake/Output Summary (Last 24 hours) at 02/12/2023 0850 Last data filed at 02/12/2023 0800 Gross per 24 hour  Intake 1070 ml  Output 2260 ml  Net -1190 ml   Net IO Since Admission: -2,043.02 mL [02/12/23 0850]  Wt Readings from Last 3 Encounters:  02/12/23 93 kg  06/11/22 92 kg  11/10/17 107 kg     Unresulted Labs (From admission, onward)     Start     Ordered   02/12/23 0500  CBC  Daily,   R     Question:  Specimen collection method  Answer:  Lab=Lab collect   02/11/23 1352   02/12/23 0500  Comprehensive metabolic panel  Daily,   R     Question:  Specimen collection method  Answer:  Lab=Lab collect   02/11/23 1352   02/11/23 0500  Fluorescent treponemal ab(fta)-IgG-bld  Tomorrow morning,   R       Question:  Specimen collection method  Answer:  Lab=Lab collect   02/10/23 1402   02/10/23 1309  Hemoglobin A1c  Once,   R       Comments: To assess prior glycemic control   Question:  Specimen collection method  Answer:  Lab=Lab collect   02/10/23 1308          Data Reviewed: I have personally reviewed following labs and imaging studies CBC: Recent Labs  Lab 02/09/23 2046 02/10/23 0507 02/11/23 0316 02/12/23 0014  WBC 10.9* 11.5* 16.4* 14.9*  NEUTROABS 7.8* 8.8* 12.9*  --   HGB 12.4* 10.9* 10.5* 11.2*  HCT 36.5* 32.1* 30.7* 32.5*  MCV 87.3 86.5 84.3 84.6  PLT 305 209 214 229   Basic Metabolic Panel: Recent Labs  Lab 02/09/23 2046 02/10/23 0507 02/11/23 0316 02/11/23 1916 02/12/23 0014  NA 129* 129* 128*  --  129*  K 4.0 3.4* 3.6  --  4.0  CL 96* 97* 98  --  98  CO2 21* 22 23  --  23  GLUCOSE 169* 156* 162*  --  196*  BUN 18 17 15   --  11  CREATININE 1.70* 1.60* 1.58*  --  1.34*  CALCIUM 9.2 8.2* 8.4*  --  8.1*  MG  --  1.6* 1.8  --   --   PHOS  --   --  1.4* 2.5   --    GFR: Estimated Creatinine Clearance: 82.5 mL/min (A) (by C-G formula based on SCr of 1.34 mg/dL (H)). Liver Function Tests: Recent Labs  Lab 02/09/23 2046 02/10/23 0507 02/11/23 0316 02/12/23 0014  AST 19 14*  --  10*  ALT 14 12  --  10  ALKPHOS 199* 158*  --  153*  BILITOT 0.8 1.2  --  0.8  PROT 8.1 6.2*  --  6.3*  ALBUMIN 3.5 2.5* 2.4* 2.3*   Recent Labs  Lab 02/09/23 2046  INR 1.3*  CBG: Recent Labs  Lab 02/11/23 0724 02/11/23 1117 02/11/23 1643 02/11/23 2120 02/12/23 0721  GLUCAP 147* 135* 163* 138* 152*   Recent Labs  Lab 02/09/23 2049 02/09/23 2341  LATICACIDVEN 2.3* 1.9   Recent Results (from the past 240 hour(s))  Culture, blood (Routine x 2)     Status: Abnormal (Preliminary result)   Collection Time: 02/09/23  8:46 PM   Specimen: BLOOD  Result Value Ref Range Status   Specimen Description BLOOD LEFT ARM  Final   Special Requests   Final    BOTTLES DRAWN AEROBIC AND ANAEROBIC Blood Culture adequate volume   Culture  Setup Time   Final    GRAM POSITIVE COCCI IN BOTH AEROBIC AND ANAEROBIC BOTTLES Performed at [facility], [address], [place], [place] [address]    Culture STAPHYLOCOCCUS AUREUS (A)  Final   Report Status PENDING  Incomplete  Culture, blood (Routine x 2)     Status: Abnormal (Preliminary result)   Collection Time: 02/09/23  9:08 PM   Specimen: BLOOD  Result Value Ref Range Status   Specimen Description BLOOD LEFT ARM  Final   Special Requests   Final    BOTTLES DRAWN AEROBIC AND ANAEROBIC Blood Culture adequate volume   Culture  Setup Time   Final    GRAM POSITIVE COCCI IN BOTH AEROBIC AND ANAEROBIC BOTTLES CRITICAL  RESULT CALLED TO, READ BACK BY AND VERIFIED WITH: PHARMD HALEY VON DOHLEN [phone] 1309 BY J RAZZAK, MT    Culture (A)  Final    STAPHYLOCOCCUS AUREUS SUSCEPTIBILITIES TO FOLLOW Performed at [facility], [address], [place], [place] [address]    Report Status PENDING  Incomplete  Blood  Culture ID Panel (Reflexed)     Status: Abnormal   Collection Time: 02/09/23  9:08 PM  Result Value Ref Range Status   Enterococcus faecalis NOT DETECTED NOT DETECTED Final   Enterococcus Faecium NOT DETECTED NOT DETECTED Final   Listeria monocytogenes NOT DETECTED NOT DETECTED Final   Staphylococcus species DETECTED (A) NOT DETECTED Final    Comment: CRITICAL RESULT CALLED TO, READ BACK BY AND VERIFIED WITH: PHARMD HALEY VON DOHLEN [phone] 1309 BY J RAZZAK, MT    Staphylococcus aureus (BCID) DETECTED (A) NOT DETECTED Final    Comment: Methicillin (oxacillin)-resistant Staphylococcus aureus (MRSA). MRSA is predictably resistant to beta-lactam antibiotics (except ceftaroline). Preferred therapy is vancomycin unless clinically contraindicated. Patient requires contact precautions if  hospitalized. CRITICAL RESULT CALLED TO, READ BACK BY AND VERIFIED WITH: PHARMD HALEY VON DOHLEN [phone] 1309 BY J RAZZAK, MT    Staphylococcus epidermidis NOT DETECTED NOT DETECTED Final   Staphylococcus lugdunensis NOT DETECTED NOT DETECTED Final   Streptococcus species NOT DETECTED NOT DETECTED Final   Streptococcus agalactiae NOT DETECTED NOT DETECTED Final   Streptococcus pneumoniae NOT DETECTED NOT DETECTED Final   Streptococcus pyogenes NOT DETECTED NOT DETECTED Final   A.calcoaceticus-baumannii NOT DETECTED NOT DETECTED Final   Bacteroides fragilis NOT DETECTED NOT DETECTED Final   Enterobacterales NOT DETECTED NOT DETECTED Final   Enterobacter cloacae complex NOT DETECTED NOT DETECTED Final   Escherichia coli NOT DETECTED NOT DETECTED Final   Klebsiella aerogenes NOT DETECTED NOT DETECTED Final   Klebsiella oxytoca NOT DETECTED NOT DETECTED Final   Klebsiella pneumoniae NOT DETECTED NOT DETECTED Final   Proteus species NOT DETECTED NOT DETECTED Final   Salmonella species NOT DETECTED NOT DETECTED Final   Serratia marcescens NOT DETECTED NOT DETECTED Final   Haemophilus influenzae NOT DETECTED  NOT DETECTED Final   Neisseria meningitidis NOT DETECTED NOT DETECTED Final   Pseudomonas aeruginosa NOT DETECTED NOT DETECTED Final   Stenotrophomonas maltophilia NOT DETECTED NOT DETECTED Final   Candida albicans NOT DETECTED NOT DETECTED Final   Candida auris NOT DETECTED NOT DETECTED Final   Candida glabrata NOT DETECTED NOT DETECTED Final   Candida krusei NOT DETECTED NOT DETECTED Final   Candida parapsilosis NOT DETECTED NOT DETECTED Final   Candida tropicalis NOT DETECTED NOT DETECTED Final   Cryptococcus neoformans/gattii NOT DETECTED NOT DETECTED Final   Meth resistant mecA/C and MREJ DETECTED (A) NOT DETECTED Final    Comment: CRITICAL RESULT CALLED TO, READ BACK BY AND VERIFIED WITH: PHARMD HALEY VON DOHLEN [phone] 1309 BY Berline Chough, MT Performed at [facility] Lab, [address] [place], [place] [address]   Resp panel by RT-PCR (RSV, Flu A&B, Covid) Anterior Nasal Swab     Status: None   Collection Time: 02/09/23 11:21 PM   Specimen: Anterior Nasal Swab  Result Value Ref Range Status   SARS Coronavirus 2 by RT PCR NEGATIVE NEGATIVE Final   Influenza A by PCR NEGATIVE NEGATIVE Final   Influenza B by PCR NEGATIVE NEGATIVE Final    Comment: (NOTE) The Xpert Xpress SARS-CoV-2/FLU/RSV plus assay is intended as an aid in the diagnosis of influenza from Nasopharyngeal swab specimens and  should not be used as a sole basis for treatment. Nasal washings and aspirates are unacceptable for Xpert Xpress SARS-CoV-2/FLU/RSV testing.  Fact Sheet for Patients: BloggerCourse.com  Fact Sheet for Healthcare Providers: SeriousBroker.it  This test is not yet approved or cleared by the [place] FDA and has been authorized for detection and/or diagnosis of SARS-CoV-2 by FDA under an Emergency Use Authorization (EUA). This EUA will remain in effect (meaning this test can be used) for the duration of the COVID-19 declaration under  Section 564(b)(1) of the Act, 21 U.S.C. section 360bbb-3(b)(1), unless the authorization is terminated or revoked.     Resp Syncytial Virus by PCR NEGATIVE NEGATIVE Final    Comment: (NOTE) Fact Sheet for Patients: BloggerCourse.com  Fact Sheet for Healthcare Providers: SeriousBroker.it  This test is not yet approved or cleared by the [place] FDA and has been authorized for detection and/or diagnosis of SARS-CoV-2 by FDA under an Emergency Use Authorization (EUA). This EUA will remain in effect (meaning this test can be used) for the duration of the COVID-19 declaration under Section 564(b)(1) of the Act, 21 U.S.C. section 360bbb-3(b)(1), unless the authorization is terminated or revoked.  Performed at [facility] Lab, [address], [name], [place] [address]     Antimicrobials: Anti-infectives (From admission, onward)    Start     Dose/Rate Route Frequency Ordered Stop   02/10/23 2200  vancomycin (VANCOREADY) IVPB 1750 mg/350 mL  Status:  Discontinued        1,750 mg 175 mL/hr over 120 Minutes Intravenous Every 24 hours 02/10/23 0754 02/10/23 1355   02/10/23 2200  vancomycin (VANCOREADY) IVPB 1750 mg/350 mL        1,750 mg 175 mL/hr over 120 Minutes Intravenous Every 24 hours 02/10/23 1402     02/10/23 1500  ceFAZolin (ANCEF) IVPB 2g/100 mL premix  Status:  Discontinued        2 g 200 mL/hr over 30 Minutes Intravenous Every 8 hours 02/10/23 1405 02/10/23 1413   02/10/23 1000  metroNIDAZOLE (FLAGYL) IVPB 500 mg  Status:  Discontinued        500 mg 100 mL/hr over 60 Minutes Intravenous Every 12 hours 02/10/23 0728 02/10/23 1315   02/10/23 0800  ceFEPIme (MAXIPIME) 2 g in sodium chloride 0.9 % 100 mL IVPB  Status:  Discontinued        2 g 200 mL/hr over 30 Minutes Intravenous Every 8 hours 02/10/23 0754 02/10/23 1315   02/09/23 2130  ceFEPIme (MAXIPIME) 2 g in sodium chloride 0.9 % 100 mL IVPB        2 g 200 mL/hr  over 30 Minutes Intravenous  Once 02/09/23 2121 02/09/23 2253   02/09/23 2130  metroNIDAZOLE (FLAGYL) IVPB 500 mg        500 mg 100 mL/hr over 60 Minutes Intravenous  Once 02/09/23 2121 02/10/23 0045   02/09/23 2130  vancomycin (VANCOCIN) IVPB 1000 mg/200 mL premix  Status:  Discontinued        1,000 mg 200 mL/hr over 60 Minutes Intravenous  Once 02/09/23 2121 02/09/23 2123   02/09/23 2130  vancomycin (VANCOREADY) IVPB 2000 mg/400 mL        2,000 mg 200 mL/hr over 120 Minutes Intravenous  Once 02/09/23 2123 02/10/23 0426      Culture/Microbiology    Component Value Date/Time   SDES BLOOD LEFT ARM 02/09/2023 2108   SPECREQUEST  02/09/2023 2108    BOTTLES DRAWN AEROBIC AND ANAEROBIC Blood Culture adequate volume  CULT (A) 02/09/2023 2108    STAPHYLOCOCCUS AUREUS SUSCEPTIBILITIES TO FOLLOW Performed at [facility] Lab, [address], [place], [place] [address]    REPTSTATUS PENDING 02/09/2023 2108    Radiology Studies: ECHOCARDIOGRAM COMPLETE  Result Date: 02/11/2023    ECHOCARDIOGRAM REPORT   Patient Name:   Ronald Hampton Date of Exam: 02/11/2023 Medical Rec #:  [id number]                 Height:       69.0 in Accession #:    [id number]                Weight:       202.8 lb Date of Birth:  08/30/1980                 BSA:          2.078 m Patient Age:    41 years                  BP:           132/72 mmHg Patient Gender: M                         HR:           98 bpm. Exam Location:  Inpatient Procedure: 2D Echo, Cardiac Doppler and Color Doppler Indications:    Bacteremia R78.81  History:        Patient has no prior history of Echocardiogram examinations.                 Stroke; Risk Factors:Hypertension and Diabetes. CKD.  Sonographer:    Lucendia Herrlich Referring Phys: [id number] TAYE T GONFA IMPRESSIONS  1. Left ventricular ejection fraction, by estimation, is 65 to 70%. The left ventricle has hyperdynamic function. The left ventricle has no regional wall motion  abnormalities. Left ventricular diastolic parameters were normal.  2. Right ventricular systolic function is normal. The right ventricular size is normal.  3. The mitral valve is normal in structure. No evidence of mitral valve regurgitation. No evidence of mitral stenosis.  4. The aortic valve is normal in structure. Aortic valve regurgitation is not visualized. No aortic stenosis is present.  5. The inferior vena cava is normal in size with greater than 50% respiratory variability, suggesting right atrial pressure of 3 mmHg. FINDINGS  Left Ventricle: Left ventricular ejection fraction, by estimation, is 65 to 70%. The left ventricle has hyperdynamic function. The left ventricle has no regional wall motion abnormalities. The left ventricular internal cavity size was normal in size. There is no left ventricular hypertrophy. Left ventricular diastolic parameters were normal. Right Ventricle: The right ventricular size is normal. No increase in right ventricular wall thickness. Right ventricular systolic function is normal. Left Atrium: Left atrial size was normal in size. Right Atrium: Right atrial size was normal in size. Pericardium: There is no evidence of pericardial effusion. Mitral Valve: The mitral valve is normal in structure. No evidence of mitral valve regurgitation. No evidence of mitral valve stenosis. Tricuspid Valve: The tricuspid valve is not well visualized. Tricuspid valve regurgitation is trivial. No evidence of tricuspid stenosis. Aortic Valve: The aortic valve is normal in structure. Aortic valve regurgitation is not visualized. No aortic stenosis is present. Aortic valve peak gradient measures 6.7 mmHg. Pulmonic Valve: The pulmonic valve was not well visualized. Pulmonic valve regurgitation is trivial. No evidence of pulmonic stenosis. Aorta: The  aortic root is normal in size and structure. Venous: The inferior vena cava is normal in size with greater than 50% respiratory variability, suggesting  right atrial pressure of 3 mmHg. IAS/Shunts: No atrial level shunt detected by color flow Doppler.  LEFT VENTRICLE PLAX 2D LVIDd:         4.60 cm   Diastology LVIDs:         2.80 cm   LV e' medial:    9.64 cm/s LV PW:         1.10 cm   LV E/e' medial:  7.9 LV IVS:        1.00 cm   LV e' lateral:   10.30 cm/s LVOT diam:     2.40 cm   LV E/e' lateral: 7.4 LV SV:         65 LV SV Index:   31 LVOT Area:     4.52 cm  RIGHT VENTRICLE RV S prime:     21.40 cm/s TAPSE (M-mode): 2.0 cm LEFT ATRIUM             Index        RIGHT ATRIUM          Index LA diam:        4.30 cm 2.07 cm/m   RA Area:     9.69 cm LA Vol (A2C):   43.1 ml 20.74 ml/m  RA Volume:   15.80 ml 7.60 ml/m LA Vol (A4C):   49.1 ml 23.62 ml/m LA Biplane Vol: 47.3 ml 22.76 ml/m  AORTIC VALVE AV Area (Vmax): 3.44 cm AV Vmax:        129.00 cm/s AV Peak Grad:   6.7 mmHg LVOT Vmax:      98.20 cm/s LVOT Vmean:     63.300 cm/s LVOT VTI:       0.144 m  AORTA Ao Root diam: 3.50 cm Ao Asc diam:  3.40 cm MITRAL VALVE               TRICUSPID VALVE MV Area (PHT): 3.89 cm    TR Peak grad:   13.4 mmHg MV Decel Time: 195 msec    TR Vmax:        183.00 cm/s MV E velocity: 76.50 cm/s MV A velocity: 86.10 cm/s  SHUNTS MV E/A ratio:  0.89        Systemic VTI:  0.14 m                            Systemic Diam: 2.40 cm Aditya Sabharwal Electronically signed by Dorthula Nettles Signature Date/Time: 02/11/2023/5:14:33 PM    Final      LOS: 2 days   Lanae Boast, MD [facility]  02/12/2023, 8:50 AM

## 2023-02-12 NOTE — Progress Notes (Signed)
OT Cancellation Note  Patient Details Name: Ronald Hampton MRN: [id number] DOB: 1981-02-19   Cancelled Treatment:    Reason Eval/Treat Not Completed: Patient declined, no reason specified. Pt continues to refuse (even to just sit EOB), wanting therapy to hold until imaging completed 2/2 patient reporting that he can not bear weight through L LE without pain. RN notified and OT will follow up as available  Galen Manila 02/12/2023, 9:49 AM

## 2023-02-12 NOTE — TOC Progression Note (Signed)
Transition of Care [facility]) - Initial/Assessment Note    Patient Details  Name: Ronald Hampton MRN: [id number] Date of Birth: [birth year]  Transition of Care [facility]) CM/SW Contact:    Ralene Bathe, LCSW Phone Number: 02/12/2023, 1:37 PM  Clinical Narrative:                 TOC received consult for substance use. Resources added to AVS.      Patient Goals and CMS Choice            Expected Discharge Plan and Services                                              Prior Living Arrangements/Services                       Activities of Daily Living Home Assistive Devices/Equipment: Wheelchair ADL Screening (condition at time of admission) Patient's cognitive ability adequate to safely complete daily activities?: Yes Is the patient deaf or have difficulty hearing?: No Does the patient have difficulty seeing, even when wearing glasses/contacts?: No Does the patient have difficulty concentrating, remembering, or making decisions?: No Patient able to express need for assistance with ADLs?: No Does the patient have difficulty dressing or bathing?: No Independently performs ADLs?: Yes (appropriate for developmental age) Does the patient have difficulty walking or climbing stairs?: Yes Weakness of Legs: Both Weakness of Arms/Hands: None  Permission Sought/Granted                  Emotional Assessment              Admission diagnosis:  SIRS (systemic inflammatory response syndrome) (HCC) [R65.10] Patient Active Problem List   Diagnosis Date Noted   Lower extremity cellulitis 02/10/2023   Severe sepsis (HCC) 02/10/2023   Pressure injury of skin 06/04/2022   Fall 06/03/2022   Acute kidney injury superimposed on chronic kidney disease (HCC) 06/03/2022   COVID-19 virus infection 06/03/2022   Hypercalcemia 06/03/2022   Monoclonal gammopathy 06/03/2022   Constipation 06/03/2022   History of discitis 06/03/2022   History of bacteremia  06/03/2022   History of osteomyelitis 06/03/2022   Transaminitis 06/03/2022   Hyperbilirubinemia 06/03/2022   Normocytic anemia 06/03/2022   Depression 06/03/2022   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   PCP:  Patient, No Pcp Per Pharmacy:   [address] [place], [place] - [address] [place] [address] Phone: [phone] Fax: [phone]  [id number] [place], [place] - [address] AT [facility] OF [address] & [address] [place]  [address] Phone: [phone] Fax: [phone]     Social Determinants of Health (SDOH) Social History: SDOH Screenings   Food Insecurity: No Food Insecurity (02/10/2023)  Housing: Low Risk  (02/10/2023)  Transportation Needs: No  Transportation Needs (02/10/2023)  Utilities: Not At Risk (02/10/2023)  Alcohol Screen: Low Risk  (11/10/2017)  Recent Concern: Alcohol Screen - Medium Risk (10/22/2017)  Tobacco Use: Low Risk  (02/09/2023)   SDOH Interventions:     Readmission Risk Interventions     No data to display

## 2023-02-13 ENCOUNTER — Inpatient Hospital Stay (HOSPITAL_COMMUNITY): Payer: No Typology Code available for payment source

## 2023-02-13 DIAGNOSIS — A419 Sepsis, unspecified organism: Secondary | ICD-10-CM | POA: Diagnosis not present

## 2023-02-13 DIAGNOSIS — R6520 Severe sepsis without septic shock: Secondary | ICD-10-CM | POA: Diagnosis not present

## 2023-02-13 LAB — COMPREHENSIVE METABOLIC PANEL
ALT: 10 U/L (ref 0–44)
AST: 11 U/L — ABNORMAL LOW (ref 15–41)
Albumin: 2.5 g/dL — ABNORMAL LOW (ref 3.5–5.0)
Alkaline Phosphatase: 147 U/L — ABNORMAL HIGH (ref 38–126)
Anion gap: 11 (ref 5–15)
BUN: 13 mg/dL (ref 6–20)
CO2: 22 mmol/L (ref 22–32)
Calcium: 8.8 mg/dL — ABNORMAL LOW (ref 8.9–10.3)
Chloride: 99 mmol/L (ref 98–111)
Creatinine, Ser: 1.42 mg/dL — ABNORMAL HIGH (ref 0.61–1.24)
GFR, Estimated: >60 mL/min (ref >60)
Glucose, Bld: 139 mg/dL — ABNORMAL HIGH (ref 70–99)
Potassium: 4.9 mmol/L (ref 3.5–5.1)
Sodium: 132 mmol/L — ABNORMAL LOW (ref 135–145)
Total Bilirubin: 0.4 mg/dL (ref 0.3–1.2)
Total Protein: 6.8 g/dL (ref 6.5–8.1)

## 2023-02-13 LAB — HEMOGLOBIN A1C
Hgb A1c MFr Bld: 5.3 % (ref 4.8–5.6)
Mean Plasma Glucose: 105 mg/dL

## 2023-02-13 LAB — CBC
HCT: 33 % — ABNORMAL LOW (ref 39.0–52.0)
Hemoglobin: 10.8 g/dL — ABNORMAL LOW (ref 13.0–17.0)
MCH: 28.3 pg (ref 26.0–34.0)
MCHC: 32.7 g/dL (ref 30.0–36.0)
MCV: 86.6 fL (ref 80.0–100.0)
Platelets: 321 10*3/uL (ref 150–400)
RBC: 3.81 MIL/uL — ABNORMAL LOW (ref 4.22–5.81)
RDW: 13.4 % (ref 11.5–15.5)
WBC: 11.1 10*3/uL — ABNORMAL HIGH (ref 4.0–10.5)
nRBC: 0 % (ref 0.0–0.2)

## 2023-02-13 LAB — CK: Total CK: 30 U/L — ABNORMAL LOW (ref 49–397)

## 2023-02-13 LAB — FLUORESCENT TREPONEMAL AB(FTA)-IGG-BLD

## 2023-02-13 LAB — GLUCOSE, CAPILLARY
Glucose-Capillary: 156 mg/dL — ABNORMAL HIGH (ref 70–99)
Glucose-Capillary: 119 mg/dL — ABNORMAL HIGH (ref 70–99)
Glucose-Capillary: 132 mg/dL — ABNORMAL HIGH (ref 70–99)
Glucose-Capillary: 144 mg/dL — ABNORMAL HIGH (ref 70–99)

## 2023-02-13 MED ORDER — LORAZEPAM 2 MG/ML IJ SOLN
1.0000 mg | Freq: Once | INTRAMUSCULAR | Status: AC | PRN
  Administered 2023-02-13: 1 mg via INTRAVENOUS
  Filled 2023-02-13: qty 1

## 2023-02-13 MED ORDER — SODIUM CHLORIDE 0.9 % IV SOLN
8.0000 mg/kg | Freq: Every day | INTRAVENOUS | Status: DC
  Administered 2023-02-13 – 2023-02-16 (×4): 650 mg via INTRAVENOUS
  Filled 2023-02-13 (×5): qty 13

## 2023-02-13 NOTE — Progress Notes (Signed)
OT Cancellation Note  Patient Details Name: Ronald Hampton MRN: [id number] DOB: 02/15/1981   Cancelled Treatment:    Reason Eval/Treat Not Completed: Patient at procedure or test/ unavailable (Attempted to see pt for skilled OT evaluation. However, pt is currently off unit for MRI. OT to reattempt to see pt for OT eval as available/appropriate.)  [name] "Orson Eva., OTR/L, MA Acute Rehab [phone]   [name] 02/13/2023, 2:51 PM

## 2023-02-13 NOTE — Evaluation (Signed)
Physical Therapy Evaluation Patient Details Name: Ronald Hampton MRN: [id number] DOB: 1981-03-20 Today's Date: 02/13/2023  History of Present Illness  42 y.o. male presents to [facility] hospital on 02/09/2023 with constipation and nausea, as well as multiple recent falls. Pt found to be septic with fever, tachycardia and tachypnea in ED. Pt with a history of osteomyelitis but refusing CT scan. MRI pending. PMH includes bipolar disorder, depression, PTSD, multiple myeloma, CKD, CVA, HTN, DMII, endocarditis, osteomyelitis.   Clinical Impression  Pt admitted with above diagnosis. PTA pt lived at home with significant other, mod I mobility with rollator vs cane. Pt currently with functional limitations due to the deficits listed below (see PT Problem List). On eval, pt required min assist bed mobility and demonstrated fair sitting balance. Unable to tolerate EOB sitting or progress OOB due to pain. Pt does report that he feels the strength and pain in L hip/LE is improving. Pt will benefit from acute skilled PT to increase their independence and safety with mobility to allow discharge.           If plan is discharge home, recommend the following:     Can travel by private vehicle        Equipment Recommendations Wheelchair (measurements PT);Wheelchair cushion (measurements PT)  Recommendations for Other Services       Functional Status Assessment Patient has had a recent decline in their functional status and demonstrates the ability to make significant improvements in function in a reasonable and predictable amount of time.     Precautions / Restrictions Precautions Precautions: Fall      Mobility  Bed Mobility Overal bed mobility: Needs Assistance Bed Mobility: Supine to Sit, Sit to Supine     Supine to sit: Min assist, HOB elevated Sit to supine: Min assist, HOB elevated   General bed mobility comments: +rail, increased time, assist with LLE    Transfers                    General transfer comment: declining OOB due to pain    Ambulation/Gait                  Stairs            Wheelchair Mobility     Tilt Bed    Modified Rankin (Stroke Patients Only)       Balance Overall balance assessment: Needs assistance Sitting-balance support: Bilateral upper extremity supported Sitting balance-Leahy Scale: Fair Sitting balance - Comments: Able to balance EOB without physical assist. BUE support. Unable to tolerate sitting due to pain requiring quick return to supine                                     Pertinent Vitals/Pain Pain Assessment Pain Assessment: Faces Faces Pain Scale: Hurts even more Pain Location: L hip/LE Pain Descriptors / Indicators: Discomfort, Grimacing, Guarding Pain Intervention(s): Limited activity within patient's tolerance, Repositioned    Home Living Family/patient expects to be discharged to:: Private residence Living Arrangements: Spouse/significant other Available Help at Discharge: Family;Available PRN/intermittently Type of Home: Apartment Home Access: Level entry       Home Layout: One level Home Equipment: Cane - single point;Rollator (4 wheels)      Prior Function Prior Level of Function : Independent/Modified Independent             Mobility Comments: amb household distances with rollator  vs cane       Hand Dominance   Dominant Hand: Right    Extremity/Trunk Assessment   Upper Extremity Assessment Upper Extremity Assessment: Defer to OT evaluation    Lower Extremity Assessment Lower Extremity Assessment: LLE deficits/detail;RLE deficits/detail;Generalized weakness RLE Deficits / Details: > 3/5, able to actively perform SLR LLE Deficits / Details: < 3/5 at hip (assist required to perform SLR), 3/5 or higher at ankle and knee LLE: Unable to fully assess due to pain LLE Sensation: WNL       Communication   Communication: No difficulties  Cognition  Arousal/Alertness: Awake/alert Behavior During Therapy: WFL for tasks assessed/performed Overall Cognitive Status: Within Functional Limits for tasks assessed                                          General Comments General comments (skin integrity, edema, etc.): max HR 120    Exercises     Assessment/Plan    PT Assessment Patient needs continued PT services  PT Problem List Decreased strength;Decreased balance;Pain;Decreased mobility;Decreased activity tolerance;Decreased knowledge of use of DME       PT Treatment Interventions DME instruction;Functional mobility training;Balance training;Patient/family education;Gait training;Therapeutic activities;Therapeutic exercise    PT Goals (Current goals can be found in the Care Plan section)  Acute Rehab PT Goals Patient Stated Goal: decrease pain PT Goal Formulation: With patient Time For Goal Achievement: 02/27/23 Potential to Achieve Goals: Good    Frequency Min 1X/week     Co-evaluation               AM-PAC PT "6 Clicks" Mobility  Outcome Measure Help needed turning from your back to your side while in a flat bed without using bedrails?: None Help needed moving from lying on your back to sitting on the side of a flat bed without using bedrails?: A Little Help needed moving to and from a bed to a chair (including a wheelchair)?: A Lot Help needed standing up from a chair using your arms (e.g., wheelchair or bedside chair)?: A Lot Help needed to walk in hospital room?: A Lot Help needed climbing 3-5 steps with a railing? : Total 6 Click Score: 14    End of Session   Activity Tolerance: Patient limited by pain Patient left: in bed;with call bell/phone within reach Nurse Communication: Mobility status PT Visit Diagnosis: Other abnormalities of gait and mobility (R26.89);Pain Pain - Right/Left: Left Pain - part of body: Hip    Time: 7846-9629 PT Time Calculation (min) (ACUTE ONLY): 21  min   Charges:   PT Evaluation $PT Eval Moderate Complexity: 1 Mod   PT General Charges $$ ACUTE PT VISIT: 1 Visit         Ferd Glassing., PT  Office # [phone]   Ilda Foil 02/13/2023, 10:14 AM

## 2023-02-13 NOTE — Progress Notes (Signed)
PROGRESS NOTE Ronald Hampton  ZOX:[id number] DOB: 01-09-1981 DOA: 02/09/2023 PCP: Patient, No Pcp Per  Brief Narrative/Hospital Course: 42 y.o. male with PMH of multiple myeloma, CKD, depression, PTSD, bipolar disorder, CVA, HTN, polysubstance use/IVDU, DM-2, endocarditis, osteomyelitis and discitis presented with 3 weeks of left lower quadrant abdominal pain, left leg pain hip pain, 2 years of chronic low back pain that worsened, was having malaise, intermittent numbness in feet, ongoing IVDU methamphetamine last used 2 weeks prior to admission.   Patient is not a great historian.  He reports coming to the hospital due to difficulty using the bathroom for 3 days. He also reports associated nausea.  No other complaints until specifically asked for symptoms.  He admits to fever with a temperature of 100.8 F yesterday.  He also admits to abdominal pain over LLQ for 2 to 3 weeks.  He admits to lower back pain for 2 years without acute change.  He reports generalized weakness for about a week.  He reports about 7 falls from feeling dizzy both spinning and lightheadedness.  He reports numbness on and off in both legs.  He reports taking pain medication for his pain.  He reports injecting meth.  He states he last injected about 2 weeks ago.  He also reports a skin rash on his abdomen.  He denies headache or neck stiff neck.  He denies smoking cigarettes or drinking alcohol.  He likes to have cardiopulmonary resuscitation in an event of sudden cardiopulmonary arrest.   In ED, septic with fever, tachycardia, tachypnea.  Normotensive. Na 129.  K3.4. Cr 1.6.  BUN 17.  Mg 1.6.  LFT normal.  WBC 11.5.  Hgb 10.9.  CK1 41.  Lactic acid 1.9.  Influenza, COVID-19 and RSV nonreactive.  UA without significant finding.  Chest x-ray with clustered rounded opacities in the RML felt to be balls of stool within interposed hepatic flexure.  CT chest, abdomen and pelvis as well as left femur ordered but unable to complete  as patient refused CT due to pain despite multiple rounds of IV Dilaudid.  ID was consulted and admitted for further management    Subjective: Patient seen examined this morning resting comfortably no new complaints Past 24 hours afebrile, not hypoxic  Labs reviewed this morning mild hyponatremia improving creatinine at 1.4 from 1.3 WBC downtrending at 12.1  Waiting for MRI of pelvis and lumbar spine-nursing reports that patient does not provide the name or number of the probation officer and kept saying they were on their way to remove the device for MRI on 7/31  Assessment and Plan: Principal Problem:   Severe sepsis (HCC) Active Problems:   History of discitis   History of bacteremia   History of osteomyelitis   Depression   History of CVA (cerebrovascular accident)   Polysubstance abuse (HCC)   Bipolar I disorder, current or most recent episode depressed, with psychotic features (HCC)   PTSD (post-traumatic stress disorder)   HTN (hypertension)   Lower extremity cellulitis  Severe sepsis due to bilateral lower extremity cellulitis POA Leukocytosis lactic acidosis: History of IE/osteomyelitis and discitis with question of functional paraplegia: MSSA bacteremia this admission.ID following given history of previous IV, IVDA is concernin 2D echo 7/29 with EF 65 to 70% no RWMA normal RV systolic function mitral valve is normal no regurgitation aortic valve is normal in structure.MRI pelvis and lumbar spine PENDING waiting for device removal I was able to get probation officer no and provided it to RN. He  refused CT chest, abdomen, pelvis and left leg in ED. but is agreeable for MRI once monitoring device in his leg is removed by his probation officer. Continue on vancomycin.   Recent Labs  Lab 02/09/23 2046 02/09/23 2049 02/09/23 2341 02/10/23 0507 02/11/23 0316 02/12/23 0014 02/13/23 0523  WBC 10.9*  --   --  11.5* 16.4* 14.9* 11.1*  LATICACIDVEN  --  2.3* 1.9  --   --   --    --     Constipation/nausea: Continue with  stool softener/laxatives    Bilateral lower extremity weakness/recurrent fall Difficulty moving weight on the left side: W/ chronic back pain over 2 years unchanged from baseline.Has history of discitis/osteomyelitis. Awaiting for MRI as above.Continue pain control, continue PT OT.   CKD-3A: At baseline. Monitor closely while on vancomycin. Hypophosphatemia resolved Hypokalemia/hypomagnesemia-resolved  Depression/BPD/PTSD/OCD: Stable.Denies SI or HI. Cont home    Hyponatremia: Improving.   Recent Labs  Lab 02/09/23 2046 02/10/23 0507 02/11/23 0316 02/12/23 0014 02/13/23 0523  NA 129* 129* 128* 129* 132*   IVDA:cessation advised he is well aware of associated risks of IVDA  DVT prophylaxis: SCDs Start: 02/10/23 0411 Code Status:   Code Status: Full Code Family Communication: plan of care discussed with patient at bedside.  Patient status is:  inpatient because of sepsis Level of care: Telemetry Medical  Dispo: The patient is from: home            Anticipated disposition: TBD Objective: Vitals last 24 hrs: Vitals:   02/12/23 2049 02/13/23 0500 02/13/23 0504 02/13/23 0931  BP: 123/86  (!) 144/92 133/86  Pulse: 94  94 (!) 102  Resp: 19  19 19   Temp: 98.6 F (37 C)  98.3 F (36.8 C) 98.4 F (36.9 C)  TempSrc:    Oral  SpO2: 97%  100% 98%  Weight:  93.6 kg    Height:       Weight change: 0.6 kg  Physical Examination: General exam: alert awake, oriented at baseline, older than stated age HEENT:Oral mucosa moist, Ear/Nose WNL grossly Respiratory system: Bilaterally clear BS,no use of accessory muscle Cardiovascular system: S1 & S2 +, No JVD. Gastrointestinal system: Abdomen soft,NT,ND, BS+ Nervous System: Alert, awake, moving all extremities,and following commands. Extremities: LE edema neg, reports spasm and difficulty weightbearing on the left leg  Skin: No rashes,no icterus. MSK: Normal muscle bulk,tone, power    Medications reviewed:  Scheduled Meds:  insulin aspart  0-15 Units Subcutaneous TID WC   insulin aspart  0-5 Units Subcutaneous QHS   polyethylene glycol  17 g Oral BID   senna-docusate  2 tablet Oral BID   Continuous Infusions:  sodium chloride 10 mL/hr at 02/10/23 2257   vancomycin 1,750 mg (02/12/23 2236)   Diet Order             Diet regular Room service appropriate? Yes; Fluid consistency: Thin  Diet effective now                  Intake/Output Summary (Last 24 hours) at 02/13/2023 1032 Last data filed at 02/13/2023 1000 Gross per 24 hour  Intake 1420 ml  Output 3400 ml  Net -1980 ml   Net IO Since Admission: -3,783.02 mL [02/13/23 1032]  Wt Readings from Last 3 Encounters:  02/13/23 93.6 kg  06/11/22 92 kg  11/10/17 107 kg     Unresulted Labs (From admission, onward)     Start     Ordered   02/12/23 0500  CBC  Daily,   R     Question:  Specimen collection method  Answer:  Lab=Lab collect   02/11/23 1352   02/12/23 0500  Comprehensive metabolic panel  Daily,   R     Question:  Specimen collection method  Answer:  Lab=Lab collect   02/11/23 1352          Data Reviewed: I have personally reviewed following labs and imaging studies CBC: Recent Labs  Lab 02/09/23 2046 02/10/23 0507 02/11/23 0316 02/12/23 0014 02/13/23 0523  WBC 10.9* 11.5* 16.4* 14.9* 11.1*  NEUTROABS 7.8* 8.8* 12.9*  --   --   HGB 12.4* 10.9* 10.5* 11.2* 10.8*  HCT 36.5* 32.1* 30.7* 32.5* 33.0*  MCV 87.3 86.5 84.3 84.6 86.6  PLT 305 209 214 229 321   Basic Metabolic Panel: Recent Labs  Lab 02/09/23 2046 02/10/23 0507 02/11/23 0316 02/11/23 1916 02/12/23 0014 02/13/23 0523  NA 129* 129* 128*  --  129* 132*  K 4.0 3.4* 3.6  --  4.0 4.9  CL 96* 97* 98  --  98 99  CO2 21* 22 23  --  23 22  GLUCOSE 169* 156* 162*  --  196* 139*  BUN 18 17 15   --  11 13  CREATININE 1.70* 1.60* 1.58*  --  1.34* 1.42*  CALCIUM 9.2 8.2* 8.4*  --  8.1* 8.8*  MG  --  1.6* 1.8  --   --   --    PHOS  --   --  1.4* 2.5  --   --    GFR: Estimated Creatinine Clearance: 78.1 mL/min (A) (by C-G formula based on SCr of 1.42 mg/dL (H)). Liver Function Tests: Recent Labs  Lab 02/09/23 2046 02/10/23 0507 02/11/23 0316 02/12/23 0014 02/13/23 0523  AST 19 14*  --  10* 11*  ALT 14 12  --  10 10  ALKPHOS 199* 158*  --  153* 147*  BILITOT 0.8 1.2  --  0.8 0.4  PROT 8.1 6.2*  --  6.3* 6.8  ALBUMIN 3.5 2.5* 2.4* 2.3* 2.5*   Recent Labs  Lab 02/09/23 2046  INR 1.3*  CBG: Recent Labs  Lab 02/12/23 0721 02/12/23 1144 02/12/23 1554 02/12/23 2050 02/13/23 0728  GLUCAP 152* 140* 139* 148* 156*   Recent Labs  Lab 02/09/23 2049 02/09/23 2341  LATICACIDVEN 2.3* 1.9   Recent Results (from the past 240 hour(s))  Culture, blood (Routine x 2)     Status: Abnormal   Collection Time: 02/09/23  8:46 PM   Specimen: BLOOD  Result Value Ref Range Status   Specimen Description BLOOD LEFT ARM  Final   Special Requests   Final    BOTTLES DRAWN AEROBIC AND ANAEROBIC Blood Culture adequate volume   Culture  Setup Time   Final    GRAM POSITIVE COCCI IN BOTH AEROBIC AND ANAEROBIC BOTTLES    Culture (A)  Final    STAPHYLOCOCCUS AUREUS SUSCEPTIBILITIES PERFORMED ON PREVIOUS CULTURE WITHIN THE LAST 5 DAYS. Performed at [facility], [address] [place], [place] [address]    Report Status 02/12/2023 FINAL  Final  Culture, blood (Routine x 2)     Status: Abnormal   Collection Time: 02/09/23  9:08 PM   Specimen: BLOOD  Result Value Ref Range Status   Specimen Description BLOOD LEFT ARM  Final   Special Requests   Final    BOTTLES DRAWN AEROBIC AND ANAEROBIC Blood Culture adequate volume   Culture  Setup Time  Final    GRAM POSITIVE COCCI IN BOTH AEROBIC AND ANAEROBIC BOTTLES CRITICAL RESULT CALLED TO, READ BACK BY AND VERIFIED WITH: Danne Harbor VON DOHLEN [phone] [phone] BY Berline Chough, [place] Performed at [facility], [address], [place], [place] [address]    Culture  METHICILLIN RESISTANT STAPHYLOCOCCUS AUREUS (A)  Final   Report Status 02/12/2023 FINAL  Final   Organism ID, Bacteria METHICILLIN RESISTANT STAPHYLOCOCCUS AUREUS  Final      Susceptibility   Methicillin resistant staphylococcus aureus - MIC*    CIPROFLOXACIN >=8 RESISTANT Resistant     ERYTHROMYCIN >=8 RESISTANT Resistant     GENTAMICIN <=0.5 SENSITIVE Sensitive     OXACILLIN >=4 RESISTANT Resistant     TETRACYCLINE <=1 SENSITIVE Sensitive     VANCOMYCIN 1 SENSITIVE Sensitive     TRIMETH/SULFA >=320 RESISTANT Resistant     CLINDAMYCIN <=0.25 SENSITIVE Sensitive     RIFAMPIN <=0.5 SENSITIVE Sensitive     Inducible Clindamycin NEGATIVE Sensitive     LINEZOLID 2 SENSITIVE Sensitive     * METHICILLIN RESISTANT STAPHYLOCOCCUS AUREUS  Blood Culture ID Panel (Reflexed)     Status: Abnormal   Collection Time: 02/09/23  9:08 PM  Result Value Ref Range Status   Enterococcus faecalis NOT DETECTED NOT DETECTED Final   Enterococcus Faecium NOT DETECTED NOT DETECTED Final   Listeria monocytogenes NOT DETECTED NOT DETECTED Final   Staphylococcus species DETECTED (A) NOT DETECTED Final    Comment: CRITICAL RESULT CALLED TO, READ BACK BY AND VERIFIED WITH: PHARMD HALEY VON DOHLEN [phone] BY J RAZZAK, MT    Staphylococcus aureus (BCID) DETECTED (A) NOT DETECTED Final    Comment: Methicillin (oxacillin)-resistant Staphylococcus aureus (MRSA). MRSA is predictably resistant to beta-lactam antibiotics (except ceftaroline). Preferred therapy is vancomycin unless clinically contraindicated. Patient requires contact precautions if  hospitalized. CRITICAL RESULT CALLED TO, READ BACK BY AND VERIFIED WITH: PHARMD HALEY VON DOHLEN [phone] [phone] BY J RAZZAK, MT    Staphylococcus epidermidis NOT DETECTED NOT DETECTED Final   Staphylococcus lugdunensis NOT DETECTED NOT DETECTED Final   Streptococcus species NOT DETECTED NOT DETECTED Final   Streptococcus agalactiae NOT DETECTED NOT DETECTED Final    Streptococcus pneumoniae NOT DETECTED NOT DETECTED Final   Streptococcus pyogenes NOT DETECTED NOT DETECTED Final   A.calcoaceticus-baumannii NOT DETECTED NOT DETECTED Final   Bacteroides fragilis NOT DETECTED NOT DETECTED Final   Enterobacterales NOT DETECTED NOT DETECTED Final   Enterobacter cloacae complex NOT DETECTED NOT DETECTED Final   Escherichia coli NOT DETECTED NOT DETECTED Final   Klebsiella aerogenes NOT DETECTED NOT DETECTED Final   Klebsiella oxytoca NOT DETECTED NOT DETECTED Final   Klebsiella pneumoniae NOT DETECTED NOT DETECTED Final   Proteus species NOT DETECTED NOT DETECTED Final   Salmonella species NOT DETECTED NOT DETECTED Final   Serratia marcescens NOT DETECTED NOT DETECTED Final   Haemophilus influenzae NOT DETECTED NOT DETECTED Final   Neisseria meningitidis NOT DETECTED NOT DETECTED Final   Pseudomonas aeruginosa NOT DETECTED NOT DETECTED Final   Stenotrophomonas maltophilia NOT DETECTED NOT DETECTED Final   Candida albicans NOT DETECTED NOT DETECTED Final   Candida auris NOT DETECTED NOT DETECTED Final   Candida glabrata NOT DETECTED NOT DETECTED Final   Candida krusei NOT DETECTED NOT DETECTED Final   Candida parapsilosis NOT DETECTED NOT DETECTED Final   Candida tropicalis NOT DETECTED NOT DETECTED Final   Cryptococcus neoformans/gattii NOT DETECTED NOT DETECTED Final   Meth resistant mecA/C and MREJ DETECTED (A) NOT DETECTED  Final    Comment: CRITICAL RESULT CALLED TO, READ BACK BY AND VERIFIED WITH: Danne Harbor VON DOHLEN [phone] [phone] BY Berline Chough, MT Performed at [facility], [address] [place], [place] [address]   Resp panel by RT-PCR (RSV, Flu A&B, Covid) Anterior Nasal Swab     Status: None   Collection Time: 02/09/23 11:21 PM   Specimen: Anterior Nasal Swab  Result Value Ref Range Status   SARS Coronavirus 2 by RT PCR NEGATIVE NEGATIVE Final   Influenza A by PCR NEGATIVE NEGATIVE Final   Influenza B by PCR NEGATIVE NEGATIVE Final     Comment: (NOTE) The Xpert Xpress SARS-CoV-2/FLU/RSV plus assay is intended as an aid in the diagnosis of influenza from Nasopharyngeal swab specimens and should not be used as a sole basis for treatment. Nasal washings and aspirates are unacceptable for Xpert Xpress SARS-CoV-2/FLU/RSV testing.  Fact Sheet for Patients: BloggerCourse.com  Fact Sheet for Healthcare Providers: SeriousBroker.it  This test is not yet approved or cleared by the [place] FDA and has been authorized for detection and/or diagnosis of SARS-CoV-2 by FDA under an Emergency Use Authorization (EUA). This EUA will remain in effect (meaning this test can be used) for the duration of the COVID-19 declaration under Section 564(b)(1) of the Act, 21 U.S.C. section 360bbb-3(b)(1), unless the authorization is terminated or revoked.     Resp Syncytial Virus by PCR NEGATIVE NEGATIVE Final    Comment: (NOTE) Fact Sheet for Patients: BloggerCourse.com  Fact Sheet for Healthcare Providers: SeriousBroker.it  This test is not yet approved or cleared by the [place] FDA and has been authorized for detection and/or diagnosis of SARS-CoV-2 by FDA under an Emergency Use Authorization (EUA). This EUA will remain in effect (meaning this test can be used) for the duration of the COVID-19 declaration under Section 564(b)(1) of the Act, 21 U.S.C. section 360bbb-3(b)(1), unless the authorization is terminated or revoked.  Performed at [facility], [address], [place], [place] [address]   Culture, blood (Routine X 2) w Reflex to ID Panel     Status: None (Preliminary result)   Collection Time: 02/12/23 10:38 AM   Specimen: BLOOD  Result Value Ref Range Status   Specimen Description BLOOD BLOOD LEFT ARM  Final   Special Requests   Final    BOTTLES DRAWN AEROBIC AND ANAEROBIC Blood Culture adequate volume    Culture   Final    NO GROWTH < 24 HOURS Performed at [facility], [address], [place], [place] [address]    Report Status PENDING  Incomplete  Culture, blood (Routine X 2) w Reflex to ID Panel     Status: None (Preliminary result)   Collection Time: 02/12/23 10:38 AM   Specimen: BLOOD  Result Value Ref Range Status   Specimen Description BLOOD BLOOD RIGHT ARM  Final   Special Requests   Final    BOTTLES DRAWN AEROBIC AND ANAEROBIC Blood Culture adequate volume   Culture   Final    NO GROWTH < 24 HOURS Performed at [facility], [address] [place], [place] [address]    Report Status PENDING  Incomplete    Antimicrobials: Anti-infectives (From admission, onward)    Start     Dose/Rate Route Frequency Ordered Stop   02/10/23 2200  vancomycin (VANCOREADY) IVPB 1750 mg/350 mL  Status:  Discontinued        1,750 mg 175 mL/hr over 120 Minutes Intravenous Every 24  hours 02/10/23 0754 02/10/23 1355   02/10/23 2200  vancomycin (VANCOREADY) IVPB 1750 mg/350 mL        1,750 mg 175 mL/hr over 120 Minutes Intravenous Every 24 hours 02/10/23 1402     02/10/23 1500  ceFAZolin (ANCEF) IVPB 2g/100 mL premix  Status:  Discontinued        2 g 200 mL/hr over 30 Minutes Intravenous Every 8 hours 02/10/23 1405 02/10/23 1413   02/10/23 1000  metroNIDAZOLE (FLAGYL) IVPB 500 mg  Status:  Discontinued        500 mg 100 mL/hr over 60 Minutes Intravenous Every 12 hours 02/10/23 0728 02/10/23 1315   02/10/23 0800  ceFEPIme (MAXIPIME) 2 g in sodium chloride 0.9 % 100 mL IVPB  Status:  Discontinued        2 g 200 mL/hr over 30 Minutes Intravenous Every 8 hours 02/10/23 0754 02/10/23 1315   02/09/23 2130  ceFEPIme (MAXIPIME) 2 g in sodium chloride 0.9 % 100 mL IVPB        2 g 200 mL/hr over 30 Minutes Intravenous  Once 02/09/23 2121 02/09/23 2253   02/09/23 2130  metroNIDAZOLE (FLAGYL) IVPB 500 mg        500 mg 100 mL/hr over 60 Minutes Intravenous  Once 02/09/23 2121 02/10/23 0045    02/09/23 2130  vancomycin (VANCOCIN) IVPB 1000 mg/200 mL premix  Status:  Discontinued        1,000 mg 200 mL/hr over 60 Minutes Intravenous  Once 02/09/23 2121 02/09/23 2123   02/09/23 2130  vancomycin (VANCOREADY) IVPB 2000 mg/400 mL        2,000 mg 200 mL/hr over 120 Minutes Intravenous  Once 02/09/23 2123 02/10/23 0426      Culture/Microbiology    Component Value Date/Time   SDES BLOOD BLOOD LEFT ARM 02/12/2023 1038   SDES BLOOD BLOOD RIGHT ARM 02/12/2023 1038   SPECREQUEST  02/12/2023 1038    BOTTLES DRAWN AEROBIC AND ANAEROBIC Blood Culture adequate volume   SPECREQUEST  02/12/2023 1038    BOTTLES DRAWN AEROBIC AND ANAEROBIC Blood Culture adequate volume   CULT  02/12/2023 1038    NO GROWTH < 24 HOURS Performed at [facility], [address] [place], [place] [address]    CULT  02/12/2023 1038    NO GROWTH < 24 HOURS Performed at [facility], [address] [place], [place] [address]    REPTSTATUS PENDING 02/12/2023 1038   REPTSTATUS PENDING 02/12/2023 1038    Radiology Studies: ECHOCARDIOGRAM COMPLETE  Result Date: 02/11/2023    ECHOCARDIOGRAM REPORT   Patient Name:   Lot Wiles WGNFAOZH Hampton Date of Exam: 02/11/2023 Medical Rec #:  [id number]                 Height:       69.0 in Accession #:    [id number]                Weight:       202.8 lb Date of Birth:  [birth year]                 BSA:          2.078 m Patient Age:    41 years                  BP:           132/72 mmHg Patient Gender: M  HR:           98 bpm. Exam Location:  Inpatient Procedure: 2D Echo, Cardiac Doppler and Color Doppler Indications:    Bacteremia R78.81  History:        Patient has no prior history of Echocardiogram examinations.                 Stroke; Risk Factors:Hypertension and Diabetes. CKD.  Sonographer:    Lucendia Herrlich Referring Phys: [id number] TAYE T GONFA IMPRESSIONS  1. Left ventricular ejection fraction, by estimation, is 65 to 70%. The left ventricle  has hyperdynamic function. The left ventricle has no regional wall motion abnormalities. Left ventricular diastolic parameters were normal.  2. Right ventricular systolic function is normal. The right ventricular size is normal.  3. The mitral valve is normal in structure. No evidence of mitral valve regurgitation. No evidence of mitral stenosis.  4. The aortic valve is normal in structure. Aortic valve regurgitation is not visualized. No aortic stenosis is present.  5. The inferior vena cava is normal in size with greater than 50% respiratory variability, suggesting right atrial pressure of 3 mmHg. FINDINGS  Left Ventricle: Left ventricular ejection fraction, by estimation, is 65 to 70%. The left ventricle has hyperdynamic function. The left ventricle has no regional wall motion abnormalities. The left ventricular internal cavity size was normal in size. There is no left ventricular hypertrophy. Left ventricular diastolic parameters were normal. Right Ventricle: The right ventricular size is normal. No increase in right ventricular wall thickness. Right ventricular systolic function is normal. Left Atrium: Left atrial size was normal in size. Right Atrium: Right atrial size was normal in size. Pericardium: There is no evidence of pericardial effusion. Mitral Valve: The mitral valve is normal in structure. No evidence of mitral valve regurgitation. No evidence of mitral valve stenosis. Tricuspid Valve: The tricuspid valve is not well visualized. Tricuspid valve regurgitation is trivial. No evidence of tricuspid stenosis. Aortic Valve: The aortic valve is normal in structure. Aortic valve regurgitation is not visualized. No aortic stenosis is present. Aortic valve peak gradient measures 6.7 mmHg. Pulmonic Valve: The pulmonic valve was not well visualized. Pulmonic valve regurgitation is trivial. No evidence of pulmonic stenosis. Aorta: The aortic root is normal in size and structure. Venous: The inferior vena cava is  normal in size with greater than 50% respiratory variability, suggesting right atrial pressure of 3 mmHg. IAS/Shunts: No atrial level shunt detected by color flow Doppler.  LEFT VENTRICLE PLAX 2D LVIDd:         4.60 cm   Diastology LVIDs:         2.80 cm   LV e' medial:    9.64 cm/s LV PW:         1.10 cm   LV E/e' medial:  7.9 LV IVS:        1.00 cm   LV e' lateral:   10.30 cm/s LVOT diam:     2.40 cm   LV E/e' lateral: 7.4 LV SV:         65 LV SV Index:   31 LVOT Area:     4.52 cm  RIGHT VENTRICLE RV S prime:     21.40 cm/s TAPSE (M-mode): 2.0 cm LEFT ATRIUM             Index        RIGHT ATRIUM          Index LA diam:        4.30  cm 2.07 cm/m   RA Area:     9.69 cm LA Vol (A2C):   43.1 ml 20.74 ml/m  RA Volume:   15.80 ml 7.60 ml/m LA Vol (A4C):   49.1 ml 23.62 ml/m LA Biplane Vol: 47.3 ml 22.76 ml/m  AORTIC VALVE AV Area (Vmax): 3.44 cm AV Vmax:        129.00 cm/s AV Peak Grad:   6.7 mmHg LVOT Vmax:      98.20 cm/s LVOT Vmean:     63.300 cm/s LVOT VTI:       0.144 m  AORTA Ao Root diam: 3.50 cm Ao Asc diam:  3.40 cm MITRAL VALVE               TRICUSPID VALVE MV Area (PHT): 3.89 cm    TR Peak grad:   13.4 mmHg MV Decel Time: 195 msec    TR Vmax:        183.00 cm/s MV E velocity: 76.50 cm/s MV A velocity: 86.10 cm/s  SHUNTS MV E/A ratio:  0.89        Systemic VTI:  0.14 m                            Systemic Diam: 2.40 cm Aditya Sabharwal Electronically signed by Dorthula Nettles Signature Date/Time: 02/11/2023/5:14:33 PM    Final      LOS: 3 days   Lanae Boast, MD [facility]  02/13/2023, 10:32 AM

## 2023-02-13 NOTE — Plan of Care (Signed)
  Problem: Nutrition: Goal: Adequate nutrition will be maintained Outcome: Progressing   Problem: Coping: Goal: Level of anxiety will decrease Outcome: Progressing   Problem: Elimination: Goal: Will not experience complications related to bowel motility Outcome: Progressing Goal: Will not experience complications related to urinary retention Outcome: Progressing   Problem: Metabolic: Goal: Ability to maintain appropriate glucose levels will improve Outcome: Progressing   Problem: Nutritional: Goal: Maintenance of adequate nutrition will improve Outcome: Progressing Goal: Progress toward achieving an optimal weight will improve Outcome: Progressing

## 2023-02-14 ENCOUNTER — Telehealth (HOSPITAL_COMMUNITY): Payer: Self-pay | Admitting: Pharmacy Technician

## 2023-02-14 ENCOUNTER — Other Ambulatory Visit (HOSPITAL_COMMUNITY): Payer: Self-pay

## 2023-02-14 DIAGNOSIS — A419 Sepsis, unspecified organism: Secondary | ICD-10-CM | POA: Diagnosis not present

## 2023-02-14 DIAGNOSIS — R6520 Severe sepsis without septic shock: Secondary | ICD-10-CM | POA: Diagnosis not present

## 2023-02-14 DIAGNOSIS — L03119 Cellulitis of unspecified part of limb: Secondary | ICD-10-CM | POA: Diagnosis not present

## 2023-02-14 DIAGNOSIS — Z8739 Personal history of other diseases of the musculoskeletal system and connective tissue: Secondary | ICD-10-CM | POA: Diagnosis not present

## 2023-02-14 LAB — CBC
HCT: 31.5 % — ABNORMAL LOW (ref 39.0–52.0)
Hemoglobin: 10.5 g/dL — ABNORMAL LOW (ref 13.0–17.0)
MCH: 28.8 pg (ref 26.0–34.0)
MCHC: 33.3 g/dL (ref 30.0–36.0)
MCV: 86.5 fL (ref 80.0–100.0)
Platelets: 345 10*3/uL (ref 150–400)
RBC: 3.64 MIL/uL — ABNORMAL LOW (ref 4.22–5.81)
RDW: 13.3 % (ref 11.5–15.5)
WBC: 9.5 10*3/uL (ref 4.0–10.5)
nRBC: 0 % (ref 0.0–0.2)

## 2023-02-14 LAB — COMPREHENSIVE METABOLIC PANEL
ALT: 11 U/L (ref 0–44)
AST: 13 U/L — ABNORMAL LOW (ref 15–41)
Albumin: 2.3 g/dL — ABNORMAL LOW (ref 3.5–5.0)
Alkaline Phosphatase: 137 U/L — ABNORMAL HIGH (ref 38–126)
Anion gap: 8 (ref 5–15)
BUN: 14 mg/dL (ref 6–20)
CO2: 25 mmol/L (ref 22–32)
Calcium: 8.5 mg/dL — ABNORMAL LOW (ref 8.9–10.3)
Chloride: 97 mmol/L — ABNORMAL LOW (ref 98–111)
Creatinine, Ser: 1.37 mg/dL — ABNORMAL HIGH (ref 0.61–1.24)
GFR, Estimated: >60 mL/min (ref >60)
Glucose, Bld: 130 mg/dL — ABNORMAL HIGH (ref 70–99)
Potassium: 4.3 mmol/L (ref 3.5–5.1)
Sodium: 130 mmol/L — ABNORMAL LOW (ref 135–145)
Total Bilirubin: 0.6 mg/dL (ref 0.3–1.2)
Total Protein: 6.7 g/dL (ref 6.5–8.1)

## 2023-02-14 LAB — GLUCOSE, CAPILLARY
Glucose-Capillary: 113 mg/dL — ABNORMAL HIGH (ref 70–99)
Glucose-Capillary: 146 mg/dL — ABNORMAL HIGH (ref 70–99)
Glucose-Capillary: 139 mg/dL — ABNORMAL HIGH (ref 70–99)
Glucose-Capillary: 126 mg/dL — ABNORMAL HIGH (ref 70–99)

## 2023-02-14 MED ORDER — LINEZOLID 600 MG PO TABS
600.0000 mg | ORAL_TABLET | Freq: Two times a day (BID) | ORAL | 0 refills | Status: AC
  Filled 2023-02-14: qty 84, 42d supply, fill #0

## 2023-02-14 NOTE — Consult Note (Signed)
Reason for Consult:Pelvic fxs Referring Physician: Mauro Kaufmann Time called: 1449 Time at bedside: 1506   Ronald Hampton is an 42 y.o. male.  HPI: Ronald Hampton was admitted earlier this week with sepsis. After a long period of being non-ambulatory he states that he's been able to ambulate with cane/RW since June. However this past Saturday his back and left hip hurt too much to allow any further ambulation. He notes his back pain has been fairly constant but has worsened in the last several weeks. MRI showed bilateral sacral and left pubic ramus fxs and orthopedic surgery was consulted.   Past Medical History:  Diagnosis Date   Bipolar 1 disorder (HCC)    Depression    PTSD (post-traumatic stress disorder)     Past Surgical History:  Procedure Laterality Date   HERNIA REPAIR     Right Foot Surgery      History reviewed. No pertinent family history.  Social History:  reports that he has never smoked. He has never used smokeless tobacco. He reports that he does not currently use alcohol. He reports that he does not currently use drugs after having used the following drugs: Heroin.  Allergies:  Allergies  Allergen Reactions   Sulfa Antibiotics Anaphylaxis   Trazodone And Nefazodone    Zofran [Ondansetron Hcl] Rash    Medications: I have reviewed the patient's current medications.  Results for orders placed or performed during the hospital encounter of 02/09/23 (from the past 48 hour(s))  Glucose, capillary     Status: Abnormal   Collection Time: 02/12/23  3:54 PM  Result Value Ref Range   Glucose-Capillary 139 (H) 70 - 99 mg/dL    Comment: Glucose reference range applies only to samples taken after fasting for at least 8 hours.  Glucose, capillary     Status: Abnormal   Collection Time: 02/12/23  8:50 PM  Result Value Ref Range   Glucose-Capillary 148 (H) 70 - 99 mg/dL    Comment: Glucose reference range applies only to samples taken after fasting for at least 8 hours.   CBC     Status: Abnormal   Collection Time: 02/13/23  5:23 AM  Result Value Ref Range   WBC 11.1 (H) 4.0 - 10.5 K/uL   RBC 3.81 (L) 4.22 - 5.81 MIL/uL   Hemoglobin 10.8 (L) 13.0 - 17.0 g/dL   HCT 60.4 (L) 54.0 - 98.1 %   MCV 86.6 80.0 - 100.0 fL   MCH 28.3 26.0 - 34.0 pg   MCHC 32.7 30.0 - 36.0 g/dL   RDW 19.1 47.8 - 29.5 %   Platelets 321 150 - 400 K/uL   nRBC 0.0 0.0 - 0.2 %    Comment: Performed at [facility], [address] [place], [place] [address]  Comprehensive metabolic panel     Status: Abnormal   Collection Time: 02/13/23  5:23 AM  Result Value Ref Range   Sodium 132 (L) 135 - 145 mmol/L   Potassium 4.9 3.5 - 5.1 mmol/L   Chloride 99 98 - 111 mmol/L   CO2 22 22 - 32 mmol/L   Glucose, Bld 139 (H) 70 - 99 mg/dL    Comment: Glucose reference range applies only to samples taken after fasting for at least 8 hours.   BUN 13 6 - 20 mg/dL   Creatinine, Ser 8.65 (H) 0.61 - 1.24 mg/dL   Calcium 8.8 (L) 8.9 - 10.3 mg/dL   Total Protein 6.8 6.5 - 8.1 g/dL  Albumin 2.5 (L) 3.5 - 5.0 g/dL   AST 11 (L) 15 - 41 U/L   ALT 10 0 - 44 U/L   Alkaline Phosphatase 147 (H) 38 - 126 U/L   Total Bilirubin 0.4 0.3 - 1.2 mg/dL   GFR, Estimated >16 >10 mL/min    Comment: (NOTE) Calculated using the CKD-EPI Creatinine Equation (2021)    Anion gap 11 5 - 15    Comment: Performed at [facility], [address] [place], [place] [address]  CK     Status: Abnormal   Collection Time: 02/13/23  6:33 AM  Result Value Ref Range   Total CK 30 (L) 49 - 397 U/L    Comment: Performed at [facility], [address] [place], [place] [address]  Glucose, capillary     Status: Abnormal   Collection Time: 02/13/23  7:28 AM  Result Value Ref Range   Glucose-Capillary 156 (H) 70 - 99 mg/dL    Comment: Glucose reference range applies only to samples taken after fasting for at least 8 hours.  Glucose, capillary     Status: Abnormal   Collection Time: 02/13/23 11:12 AM  Result  Value Ref Range   Glucose-Capillary 119 (H) 70 - 99 mg/dL    Comment: Glucose reference range applies only to samples taken after fasting for at least 8 hours.  Glucose, capillary     Status: Abnormal   Collection Time: 02/13/23  4:08 PM  Result Value Ref Range   Glucose-Capillary 132 (H) 70 - 99 mg/dL    Comment: Glucose reference range applies only to samples taken after fasting for at least 8 hours.  Glucose, capillary     Status: Abnormal   Collection Time: 02/13/23  9:37 PM  Result Value Ref Range   Glucose-Capillary 144 (H) 70 - 99 mg/dL    Comment: Glucose reference range applies only to samples taken after fasting for at least 8 hours.  CBC     Status: Abnormal   Collection Time: 02/14/23  2:48 AM  Result Value Ref Range   WBC 9.5 4.0 - 10.5 K/uL   RBC 3.64 (L) 4.22 - 5.81 MIL/uL   Hemoglobin 10.5 (L) 13.0 - 17.0 g/dL   HCT 19.1 (L) 47.8 - 29.5 %   MCV 86.5 80.0 - 100.0 fL   MCH 28.8 26.0 - 34.0 pg   MCHC 33.3 30.0 - 36.0 g/dL   RDW 62.1 30.8 - 65.7 %   Platelets 345 150 - 400 K/uL   nRBC 0.0 0.0 - 0.2 %    Comment: Performed at [facility], [address], [place], [place] [address]  Comprehensive metabolic panel     Status: Abnormal   Collection Time: 02/14/23  2:48 AM  Result Value Ref Range   Sodium 130 (L) 135 - 145 mmol/L   Potassium 4.3 3.5 - 5.1 mmol/L   Chloride 97 (L) 98 - 111 mmol/L   CO2 25 22 - 32 mmol/L   Glucose, Bld 130 (H) 70 - 99 mg/dL    Comment: Glucose reference range applies only to samples taken after fasting for at least 8 hours.   BUN 14 6 - 20 mg/dL   Creatinine, Ser 2.95 (H) 0.61 - 1.24 mg/dL   Calcium 8.5 (L) 8.9 - 10.3 mg/dL   Total Protein 6.7 6.5 - 8.1 g/dL   Albumin 2.3 (L) 3.5 - 5.0 g/dL   AST 13 (L) 15 - 41 U/L   ALT  11 0 - 44 U/L   Alkaline Phosphatase 137 (H) 38 - 126 U/L   Total Bilirubin 0.6 0.3 - 1.2 mg/dL   GFR, Estimated >29 >56 mL/min    Comment: (NOTE) Calculated using the CKD-EPI Creatinine Equation (2021)     Anion gap 8 5 - 15    Comment: Performed at [facility], [address] [place], [place] [address]  Glucose, capillary     Status: Abnormal   Collection Time: 02/14/23  7:27 AM  Result Value Ref Range   Glucose-Capillary 113 (H) 70 - 99 mg/dL    Comment: Glucose reference range applies only to samples taken after fasting for at least 8 hours.  Glucose, capillary     Status: Abnormal   Collection Time: 02/14/23 11:07 AM  Result Value Ref Range   Glucose-Capillary 146 (H) 70 - 99 mg/dL    Comment: Glucose reference range applies only to samples taken after fasting for at least 8 hours.    MR PELVIS WO CONTRAST  Result Date: 02/13/2023 CLINICAL DATA:  Left hip and leg pain.  MRSA bacteremia. EXAM: MRI PELVIS WITHOUT CONTRAST TECHNIQUE: Multiplanar multisequence MR imaging of the pelvis was performed. No intravenous contrast was administered. COMPARISON:  Pelvic and right hip radiographs 06/05/2022. Pelvic CT 06/03/2022. FINDINGS: Urinary Tract: The visualized distal ureters are not dilated. The urinary bladder is mildly distended without wall thickening or surrounding inflammation. Bowel: No bowel wall thickening, distention or surrounding inflammation identified within the pelvis. Mildly prominent stool in the rectum. Vascular/Lymphatic: There are small left common iliac and bilateral external iliac lymph nodes bilaterally which are nonspecific, although likely reactive. No evidence of aneurysm or large vessel occlusion. Reproductive: The prostate gland and seminal vesicles appear unremarkable. Other: No evidence of pelvic ascites, focal extraluminal fluid collection or significant inflammation in the pelvis. Musculoskeletal: Lumbar spine findings are dictated separately. There are new nondisplaced bilateral sacral insufficiency fractures, most obvious on the coronal images. There is surrounding sacral bone marrow edema. No evidence of sacroiliac widening, erosive disease or effusion. In  addition, there are nondisplaced fractures of the superior pubic rami bilaterally. The left inferior pubic ramus demonstrates a mildly displaced fracture with associated marrow edema and fluid collections within the left adductor musculature measuring up to 4.5 cm on coronal image 27/2. There is surrounding adductor and gluteus muscular edema. No evidence of proximal femur fracture or significant hip joint effusion. IMPRESSION: 1. Multiple acute/subacute pelvic fractures as described. There are nondisplaced fractures of the sacrum and superior pubic rami bilaterally with associated marrow edema. 2. Mildly displaced fracture of the left inferior pubic ramus with associated marrow edema, muscular edema and fluid collections within the left adductor musculature, likely hematomas. These fluid collections could be secondarily infected, although there is no specific evidence for that. 3. No evidence of septic arthritis or osteomyelitis. 4. Lumbar spine findings are dictated separately. Electronically Signed   By: Carey Bullocks M.D.   On: 02/13/2023 18:49   MR LUMBAR SPINE WO CONTRAST  Result Date: 02/13/2023 CLINICAL DATA:  Low back pain.  MRSA bacteremia. EXAM: MRI LUMBAR SPINE WITHOUT CONTRAST TECHNIQUE: Multiplanar, multisequence MR imaging of the lumbar spine was performed. No intravenous contrast was administered. COMPARISON:  MRI 11/21/2020 FINDINGS: Segmentation: 5 lumbar type vertebral bodies as numbered previously. Alignment: Kyphotic curvature at the thoracolumbar junction region. Chronic collapse/fusion of the L1 and L2 vertebral bodies responsible for this. Chronic bilateral pars defects at L5 with chronic spondylolisthesis of 7-8 mm. Vertebrae: Chronic  collapse and fusion at the L1-2 level as noted above. Chronic endplate deformity at L3 without edema. Mild degenerative type endplate signal at T11-12, T12-L1, L3-4 and L4-5. Findings are likely degenerative. Unable to implicate infection at those levels  based on these findings. Bilateral sacroiliac edema. See results of pelvic MRI. Conus medullaris and cauda equina: Conus extends to the T12-L1 level. Conus and cauda equina appear normal. Paraspinal and other soft tissues: Markedly dilated bladder. Disc levels: Disc bulges at T11-12 and T12-L1. No L2-3: Mild bulging of the disc. No compressive stenosis. L3-4: Mild bulging of the disc.  No compressive stenosis. L4-5: Mild bulging of the disc.  No compressive stenosis. L5-S1: Chronic bilateral pars defects, with anterolisthesis of 7-8 mm. Disc degeneration with loss of disc height. No compressive canal stenosis. Bilateral foraminal narrowing but without definite compression of the exiting L5 nerves. IMPRESSION: 1. No specific evidence of infection in the lumbar spine. 2. Chronic collapse/fusion of the L1 and L2 vertebral bodies with kyphotic curvature at the thoracolumbar junction region. 3. 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 Disc degeneration with loss of disc height. Bilateral foraminal narrowing but without definite compression of the exiting L5 nerves. 4. Mild degenerative type endplate signal at T11-12, T12-L1, L3-4 and L4-5. Findings are likely degenerative. Unable to implicate infection at those levels based on these findings. 5. Bilateral sacroiliac edema. See results of pelvic MRI. 6. Markedly dilated bladder. Electronically Signed   By: Paulina Fusi M.D.   On: 02/13/2023 18:01    Review of Systems  HENT:  Negative for ear discharge, ear pain, hearing loss and tinnitus.   Eyes:  Negative for photophobia and pain.  Respiratory:  Negative for cough and shortness of breath.   Cardiovascular:  Negative for chest pain.  Gastrointestinal:  Negative for abdominal pain, nausea and vomiting.  Genitourinary:  Negative for dysuria, flank pain, frequency and urgency.  Musculoskeletal:  Positive for arthralgias (Left hip) and back pain. Negative for myalgias and neck pain.   Neurological:  Negative for dizziness and headaches.  Hematological:  Does not bruise/bleed easily.  Psychiatric/Behavioral:  The patient is not nervous/anxious.    Blood pressure 125/89, pulse 99, temperature 98.6 F (37 C), temperature source Oral, resp. rate 19, height 5\' 9"  (1.753 m), weight 93.4 kg, SpO2 97%. Physical Exam Constitutional:      General: He is not in acute distress.    Appearance: He is well-developed. He is not diaphoretic.  HENT:     Head: Normocephalic and atraumatic.  Eyes:     General: No scleral icterus.       Right eye: No discharge.        Left eye: No discharge.     Conjunctiva/sclera: Conjunctivae normal.  Cardiovascular:     Rate and Rhythm: Normal rate and regular rhythm.  Pulmonary:     Effort: Pulmonary effort is normal. No respiratory distress.  Musculoskeletal:     Cervical back: Normal range of motion.     Comments: Pelvis--no traumatic wounds or rash, no ecchymosis, stable to manual stress, mild pain with AP/lat compression  BLE No traumatic wounds, ecchymosis, or rash  Nontender, able to SLR  No knee or ankle effusion  Knee stable to varus/ valgus and anterior/posterior stress  Sens DPN, SPN, TN intact  Motor EHL, ext, flex, evers 5/5  DP 2+, PT 2+, No significant edema  Skin:    General: Skin is warm and dry.  Neurological:  Mental Status: He is alert.  Psychiatric:        Mood and Affect: Mood normal.        Behavior: Behavior normal.     Assessment/Plan: Pelvic fxs -- He's got pretty good bed mobility which usually predicts good non-surgical outcome but given his recent inability to bear weight may benefit from sacral fixation. Will make NPO and Dr. Jena Gauss will evaluate in the morning.    Freeman Caldron, PA-C Orthopedic Surgery [phone] 02/14/2023, 3:17 PM

## 2023-02-14 NOTE — TOC Benefit Eligibility Note (Signed)
Pharmacy Patient Advocate Encounter  Insurance verification completed.    The patient is insured through [employer]  Ran test claim for linezolid  (Zyvox) 600 mg and Requires Prior Authorization   This test claim was processed through [employer]- copay amounts may vary at other pharmacies due to [employer], or as the patient moves through the different stages of their insurance plan.    Roland Earl, CPHT Pharmacy Patient Advocate Specialist [facility] Patient Advocate Team Direct Number: [phone]  Fax: [phone]

## 2023-02-14 NOTE — Plan of Care (Signed)
  Problem: Clinical Measurements: Goal: Respiratory complications will improve Outcome: Progressing Goal: Cardiovascular complication will be avoided Outcome: Progressing   Problem: Nutrition: Goal: Adequate nutrition will be maintained Outcome: Progressing   Problem: Elimination: Goal: Will not experience complications related to bowel motility Outcome: Progressing Goal: Will not experience complications related to urinary retention Outcome: Progressing

## 2023-02-14 NOTE — Telephone Encounter (Signed)
Pharmacy Patient Advocate Encounter   Received notification that prior authorization for Linezolid 600MG  tablets is required/requested.   Insurance verification completed.   The patient is insured through  [employer]   .   Per test claim: PA required; PA submitted to [employer] via CoverMyMeds Key/confirmation #/EOC [id number] Status is pending

## 2023-02-14 NOTE — Progress Notes (Signed)
OT Cancellation Note  Patient Details Name: Ronald Hampton MRN: [id number] DOB: Nov 23, 1980   Cancelled Treatment:    Reason Eval/Treat Not Completed: Patient not medically ready (7/31 MRI revealed multiple acute/subacute pelvic fractures. Orthopedic surgery consult for possible sacral fixation currently scheduled for morning of 8/2. Per discussion with RN, OT to await ortho consult prior to OT evaluation.)  Ronald Hampton "Ronald Hampton" M., OTR/L, MA Acute Rehab [phone]   [name] 02/14/2023, 6:15 PM

## 2023-02-14 NOTE — Progress Notes (Signed)
[facility]  PROGRESS NOTE  Art Mcgloin Duncan III WGN:[id number] DOB: 08-Nov-1980 DOA: 02/09/2023 PCP: Patient, No Pcp Per   Brief HPI:   42 y.o. male with PMH of multiple myeloma, CKD, depression, PTSD, bipolar disorder, CVA, HTN, polysubstance use/IVDU, DM-2, endocarditis, osteomyelitis and discitis presented with 3 weeks of left lower quadrant abdominal pain, left leg pain hip pain, 2 years of chronic low back pain that worsened, was having malaise, intermittent numbness in feet, ongoing IVDU methamphetamine last used 2 weeks prior to admission.   Patient is not a great historian.  He reports coming to the hospital due to difficulty using the bathroom for 3 days. He also reports associated nausea.  No other complaints until specifically asked for symptoms.  He admits to fever with a temperature of 100.8 F yesterday.  He also admits to abdominal pain over LLQ for 2 to 3 weeks.  He admits to lower back pain for 2 years without acute change.     Assessment/Plan:   Severe sepsis due to bilateral lower extremity cellulitis, POA -History of IE/osteomyelitis and discitis with question of functional paraplegia: MSSA bacteremia this admission.ID following given history of previous IVDA is concern -2D echo 7/29 with EF 65 to 70% no RWMA normal RV systolic function mitral valve is normal no regurgitation aortic valve is normal in structur  He refused CT chest, abdomen, pelvis and left leg in ED. but is agreeable for MRI once monitoring device in his leg is removed by his probation officer.  -Monitoring device was removed, underwent MRI of lumbar spine and pelvis, MRI lumbar spine was unremarkable, MRI pelvis shows pelvic fracture and fluid collection in the left abductor muscles -Orthopedic surgery consulted -Vancomycin changed to daptomycin per ID, will need 8 weeks of antibiotics -As per ID ID f/u with me on 8/22 @ 2pm Patient doesn't appear to be willing to stay for the 2 weeks at least to  get iv abx -- he is not an opat candidate so plan to transition to PO abx linezolid when he leaves, but recommend to get at least 10-14 days of iv abx Please have linezolid po 600 mg bid for 6 weeks if he decides to leave before the 2 weeks mark, and give a dose of oritavancin  Bilateral lower extremity weakness/recurrent fall Difficulty moving weight on the left side: W/ chronic back pain over 2 years unchanged from baseline.Has history of discitis/osteomyelitis. Continue pain control, continue PT OT. -MRI shows pelvic fracture, orthopedics consulted  CKD stage IIIa -Creatinine at baseline  Hyperphosphatemia -Replete  Hyponatremia -Stable, sodium has been stable around 130  Depression/bipolar disorder -Stable  Medications     insulin aspart  0-15 Units Subcutaneous TID WC   insulin aspart  0-5 Units Subcutaneous QHS   polyethylene glycol  17 g Oral BID   senna-docusate  2 tablet Oral BID     Data Reviewed:   CBG:  Recent Labs  Lab 02/13/23 0728 02/13/23 1112 02/13/23 1608 02/13/23 2137 02/14/23 0727  GLUCAP 156* 119* 132* 144* 113*    SpO2: 100 % O2 Flow Rate (L/min): 98.9 L/min    Vitals:   02/13/23 1606 02/13/23 2140 02/14/23 0500 02/14/23 0500  BP: 123/79 133/84  124/71  Pulse: 98 87  100  Resp: 19 18  18   Temp: 98.2 F (36.8 C) 98.1 F (36.7 C)  98.6 F (37 C)  TempSrc: Oral     SpO2: 97% 95%  100%  Weight:   93.4 kg  Height:          Data Reviewed:  Basic Metabolic Panel: Recent Labs  Lab 02/10/23 0507 02/11/23 0316 02/11/23 1916 02/12/23 0014 02/13/23 0523 02/14/23 0248  NA 129* 128*  --  129* 132* 130*  K 3.4* 3.6  --  4.0 4.9 4.3  CL 97* 98  --  98 99 97*  CO2 22 23  --  23 22 25   GLUCOSE 156* 162*  --  196* 139* 130*  BUN 17 15  --  11 13 14   CREATININE 1.60* 1.58*  --  1.34* 1.42* 1.37*  CALCIUM 8.2* 8.4*  --  8.1* 8.8* 8.5*  MG 1.6* 1.8  --   --   --   --   PHOS  --  1.4* 2.5  --   --   --     CBC: Recent Labs  Lab  02/09/23 2046 02/10/23 0507 02/11/23 0316 02/12/23 0014 02/13/23 0523 02/14/23 0248  WBC 10.9* 11.5* 16.4* 14.9* 11.1* 9.5  NEUTROABS 7.8* 8.8* 12.9*  --   --   --   HGB 12.4* 10.9* 10.5* 11.2* 10.8* 10.5*  HCT 36.5* 32.1* 30.7* 32.5* 33.0* 31.5*  MCV 87.3 86.5 84.3 84.6 86.6 86.5  PLT 305 209 214 229 321 345    LFT Recent Labs  Lab 02/09/23 2046 02/10/23 0507 02/11/23 0316 02/12/23 0014 02/13/23 0523 02/14/23 0248  AST 19 14*  --  10* 11* 13*  ALT 14 12  --  10 10 11   ALKPHOS 199* 158*  --  153* 147* 137*  BILITOT 0.8 1.2  --  0.8 0.4 0.6  PROT 8.1 6.2*  --  6.3* 6.8 6.7  ALBUMIN 3.5 2.5* 2.4* 2.3* 2.5* 2.3*     Antibiotics: Anti-infectives (From admission, onward)    Start     Dose/Rate Route Frequency Ordered Stop   02/13/23 1500  DAPTOmycin (CUBICIN) 650 mg in sodium chloride 0.9 % IVPB        8 mg/kg  79.9 kg (Adjusted) 126 mL/hr over 30 Minutes Intravenous Daily 02/13/23 1409     02/10/23 2200  vancomycin (VANCOREADY) IVPB 1750 mg/350 mL  Status:  Discontinued        1,750 mg 175 mL/hr over 120 Minutes Intravenous Every 24 hours 02/10/23 0754 02/10/23 1355   02/10/23 2200  vancomycin (VANCOREADY) IVPB 1750 mg/350 mL  Status:  Discontinued        1,750 mg 175 mL/hr over 120 Minutes Intravenous Every 24 hours 02/10/23 1402 02/13/23 1409   02/10/23 1500  ceFAZolin (ANCEF) IVPB 2g/100 mL premix  Status:  Discontinued        2 g 200 mL/hr over 30 Minutes Intravenous Every 8 hours 02/10/23 1405 02/10/23 1413   02/10/23 1000  metroNIDAZOLE (FLAGYL) IVPB 500 mg  Status:  Discontinued        500 mg 100 mL/hr over 60 Minutes Intravenous Every 12 hours 02/10/23 0728 02/10/23 1315   02/10/23 0800  ceFEPIme (MAXIPIME) 2 g in sodium chloride 0.9 % 100 mL IVPB  Status:  Discontinued        2 g 200 mL/hr over 30 Minutes Intravenous Every 8 hours 02/10/23 0754 02/10/23 1315   02/09/23 2130  ceFEPIme (MAXIPIME) 2 g in sodium chloride 0.9 % 100 mL IVPB        2 g 200 mL/hr  over 30 Minutes Intravenous  Once 02/09/23 2121 02/09/23 2253   02/09/23 2130  metroNIDAZOLE (FLAGYL) IVPB 500 mg  500 mg 100 mL/hr over 60 Minutes Intravenous  Once 02/09/23 2121 02/10/23 0045   02/09/23 2130  vancomycin (VANCOCIN) IVPB 1000 mg/200 mL premix  Status:  Discontinued        1,000 mg 200 mL/hr over 60 Minutes Intravenous  Once 02/09/23 2121 02/09/23 2123   02/09/23 2130  vancomycin (VANCOREADY) IVPB 2000 mg/400 mL        2,000 mg 200 mL/hr over 120 Minutes Intravenous  Once 02/09/23 2123 02/10/23 0426        DVT prophylaxis: SCDs  Code Status: Full code  Family Communication: No family at bedside   CONSULTS    Subjective   Continues to complain of pelvic pain, MRI pelvis shows pelvic fracture   Objective    Physical Examination:   General-appears in no acute distress Heart-S1-S2, regular, no murmur auscultated Lungs-clear to auscultation bilaterally, no wheezing or crackles auscultated Abdomen-soft, nontender, no organomegaly Extremities-no edema in the lower extremities Neuro-alert, oriented x3, no focal deficit noted  Status is: Inpatient:             Meredeth Ide   [facility] If 7PM-7AM, please contact night-coverage at [email or web address], Office  [phone]   02/14/2023, 8:48 AM  LOS: 4 days

## 2023-02-14 NOTE — Telephone Encounter (Signed)
Pharmacy Patient Advocate Encounter  Received notification from  [employer]  that Prior Authorization for Linezolid 600MG  tablets has been DENIED. Please advise how you'd like to proceed. Full denial letter will be uploaded to the media tab. See denial reason below.  Unable to approve the medication (LINEZOLID Tablet 600MG ) due to unmet criteria (2) as  outlined in the plan guideline below Culture and sensitivity (C&S) report for the current infection shows isolated pathogen is a  gram-positive bacteria susceptible to linezolid, unless provider submits documentation that  obtaining a C&S report is not feasible; ii. Member meets one of the following (a, b, or c): a) Failure of at least 2 formulary antibiotics to which the isolated pathogen is susceptible (if  available) per C&S report, unless clinically significant adverse effects are experienced or all are  contraindicated; b) C&S report shows resistance or lack of susceptibility of the isolated pathogen to all formulary  antibiotics FDA-approved for member's diagnosis; c) If provider documents that obtaining a C&S report is not feasible: Failure of at least 2  formulary antibiotics indicated for member's diagnosis (if available), unless clinically significant  adverse effects are experienced or all are contraindicated; PA #/Case ID/Reference #: [id number]

## 2023-02-14 NOTE — Progress Notes (Addendum)
PT Cancellation Note  Patient Details Name: Ronald Hampton MRN: [id number] DOB: 1981-07-01   Cancelled Treatment:    Reason Eval/Treat Not Completed: Medical issues which prohibited therapy. 7/31 MRI revealed multiple acute/subacute pelvic fractures. Will await ortho consult prior to further mobility.   Ilda Foil 02/14/2023, 11:38 AM

## 2023-02-14 NOTE — Progress Notes (Addendum)
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 Primary team had discussed with ortho about finding and await recs  Leukocytosis and fever had resolved  Tte no obvious veg  Hep b serology negative -- can do hep b vaccination outpatient during id f/u Hiv screen nonreactive Rpr/fta nonreactive    Plan: Change vanc to daptomycin today Repeat bcx had been negative to date; tx length likely 8 weeks or more and we can defer tee at this time F/u ortho recs ID f/u with me on 8/22 @ 2pm Patient doesn't appear to be willing to stay for the 2 weeks at least to get iv abx -- he is not an opat candidate so plan to transition to PO abx linezolid when he leaves, but recommend to get at least 10-14 days of iv abx Please have linezolid po 600 mg bid for 6 weeks if he decides to leave before the 2 weeks mark, and give a dose of oritavancin Discussed with primary team    I spent more than 50 minute reviewing data/chart, and coordinating care, providing direct face to face time providing counseling/discussing diagnostics/treatment plan with patient and treatment team   Principal Problem:    Severe sepsis (HCC) Active Problems:   Bipolar I disorder, current or most recent episode depressed, with psychotic features (HCC)   PTSD (post-traumatic stress disorder)   HTN (hypertension)   History of discitis   History of bacteremia   History of osteomyelitis   Depression   History of CVA (cerebrovascular accident)   Polysubstance abuse (HCC)   Lower extremity cellulitis   Allergies  Allergen Reactions   Sulfa Antibiotics Anaphylaxis   Trazodone And Nefazodone    Zofran [Ondansetron Hcl] Rash    Scheduled Meds:  insulin aspart  0-15 Units Subcutaneous TID WC   insulin aspart  0-5 Units Subcutaneous QHS   polyethylene glycol  17 g Oral BID   senna-docusate  2 tablet Oral BID   Continuous Infusions:  sodium chloride 10 mL/hr at 02/10/23 2257   DAPTOmycin (CUBICIN) 650 mg in sodium chloride 0.9 % IVPB 650 mg (02/13/23 1832)   PRN Meds:.sodium chloride, acetaminophen **OR** acetaminophen, HYDROmorphone (DILAUDID) injection, HYDROmorphone (DILAUDID) injection, melatonin, naLOXone (NARCAN)  injection   SUBJECTIVE: Improving left hip/lower back pain Discussed mri finding Discuss need to stay at least 2 weeks for iv abx.  However it doesn't appear he wants to do that  Review of Systems: ROS All other ROS was negative, except mentioned above     OBJECTIVE: Vitals:   02/13/23 2140 02/14/23 0500 02/14/23 0500 02/14/23 0916  BP: 133/84  124/71 125/89  Pulse: 87  100 99  Resp: 18  18 19   Temp: 98.1 F (36.7 C)  98.6 F (37 C) 98.6 F (37 C)  TempSrc:    Oral  SpO2: 95%  100% 97%  Weight:  93.4 kg    Height:       Body mass index is 30.41 kg/m.  Physical Exam General/constitutional: no distress, pleasant HEENT: Normocephalic, PER, Conj Clear, EOMI, Oropharynx clear Neck supple CV: rrr no mrg Lungs: clear to auscultation, normal respiratory effort Abd: Soft, Nontender Ext: no edema Skin: No Rash Neuro: nonfocal MSK: no peripheral joint  swelling/tenderness/warmth; back spines nontender    Lab Results Lab Results  Component Value Date   WBC 9.5 02/14/2023   HGB 10.5 (L) 02/14/2023   HCT 31.5 (L) 02/14/2023   MCV 86.5 02/14/2023   PLT 345 02/14/2023    Lab Results  Component Value Date   CREATININE 1.37 (H) 02/14/2023   BUN 14 02/14/2023   NA 130 (L) 02/14/2023   K 4.3 02/14/2023   CL 97 (L) 02/14/2023   CO2 25 02/14/2023    Lab Results  Component Value Date   ALT 11 02/14/2023   AST 13 (L) 02/14/2023   ALKPHOS 137 (H) 02/14/2023   BILITOT 0.6 02/14/2023      Microbiology: Recent Results (from the past 240 hour(s))  Culture, blood (Routine x 2)     Status: Abnormal   Collection Time: 02/09/23  8:46 PM   Specimen: BLOOD  Result Value Ref Range Status   Specimen Description BLOOD LEFT ARM  Final   Special Requests   Final    BOTTLES DRAWN AEROBIC AND ANAEROBIC Blood Culture adequate volume   Culture  Setup Time   Final    GRAM POSITIVE COCCI IN BOTH AEROBIC AND ANAEROBIC BOTTLES    Culture (A)  Final    STAPHYLOCOCCUS AUREUS SUSCEPTIBILITIES PERFORMED ON PREVIOUS CULTURE WITHIN THE LAST 5 DAYS. Performed at [facility], [address], [place], [place] [address]    Report Status 02/12/2023 FINAL  Final  Culture, blood (Routine x 2)     Status: Abnormal (Preliminary result)   Collection Time: 02/09/23  9:08 PM   Specimen: BLOOD  Result Value Ref Range Status   Specimen Description BLOOD LEFT ARM  Final   Special Requests   Final    BOTTLES DRAWN AEROBIC AND ANAEROBIC Blood Culture adequate volume   Culture  Setup Time   Final    GRAM POSITIVE COCCI IN BOTH AEROBIC AND ANAEROBIC BOTTLES CRITICAL RESULT CALLED TO, READ BACK BY AND VERIFIED WITH: PHARMD HALEY VON DOHLEN [phone] 1309 BY Berline Chough, [place] Performed at [facility], [address] [place], [place] [address]    Culture METHICILLIN RESISTANT STAPHYLOCOCCUS AUREUS (A)  Final   Report Status PENDING  Incomplete   Organism  ID, Bacteria METHICILLIN RESISTANT STAPHYLOCOCCUS AUREUS  Final      Susceptibility   Methicillin resistant staphylococcus aureus - MIC*    CIPROFLOXACIN >=8 RESISTANT Resistant     ERYTHROMYCIN >=8 RESISTANT Resistant     GENTAMICIN <=0.5 SENSITIVE Sensitive     OXACILLIN >=4 RESISTANT Resistant     TETRACYCLINE <=1 SENSITIVE Sensitive  VANCOMYCIN 1 SENSITIVE Sensitive     TRIMETH/SULFA >=320 RESISTANT Resistant     CLINDAMYCIN <=0.25 SENSITIVE Sensitive     RIFAMPIN <=0.5 SENSITIVE Sensitive     Inducible Clindamycin NEGATIVE Sensitive     LINEZOLID 2 SENSITIVE Sensitive     * METHICILLIN RESISTANT STAPHYLOCOCCUS AUREUS  Blood Culture ID Panel (Reflexed)     Status: Abnormal   Collection Time: 02/09/23  9:08 PM  Result Value Ref Range Status   Enterococcus faecalis NOT DETECTED NOT DETECTED Final   Enterococcus Faecium NOT DETECTED NOT DETECTED Final   Listeria monocytogenes NOT DETECTED NOT DETECTED Final   Staphylococcus species DETECTED (A) NOT DETECTED Final    Comment: CRITICAL RESULT CALLED TO, READ BACK BY AND VERIFIED WITH: PHARMD HALEY VON DOHLEN [phone] 1309 BY J RAZZAK, MT    Staphylococcus aureus (BCID) DETECTED (A) NOT DETECTED Final    Comment: Methicillin (oxacillin)-resistant Staphylococcus aureus (MRSA). MRSA is predictably resistant to beta-lactam antibiotics (except ceftaroline). Preferred therapy is vancomycin unless clinically contraindicated. Patient requires contact precautions if  hospitalized. CRITICAL RESULT CALLED TO, READ BACK BY AND VERIFIED WITH: PHARMD HALEY VON DOHLEN [phone] 1309 BY J RAZZAK, MT    Staphylococcus epidermidis NOT DETECTED NOT DETECTED Final   Staphylococcus lugdunensis NOT DETECTED NOT DETECTED Final   Streptococcus species NOT DETECTED NOT DETECTED Final   Streptococcus agalactiae NOT DETECTED NOT DETECTED Final   Streptococcus pneumoniae NOT DETECTED NOT DETECTED Final   Streptococcus pyogenes NOT DETECTED NOT DETECTED  Final   A.calcoaceticus-baumannii NOT DETECTED NOT DETECTED Final   Bacteroides fragilis NOT DETECTED NOT DETECTED Final   Enterobacterales NOT DETECTED NOT DETECTED Final   Enterobacter cloacae complex NOT DETECTED NOT DETECTED Final   Escherichia coli NOT DETECTED NOT DETECTED Final   Klebsiella aerogenes NOT DETECTED NOT DETECTED Final   Klebsiella oxytoca NOT DETECTED NOT DETECTED Final   Klebsiella pneumoniae NOT DETECTED NOT DETECTED Final   Proteus species NOT DETECTED NOT DETECTED Final   Salmonella species NOT DETECTED NOT DETECTED Final   Serratia marcescens NOT DETECTED NOT DETECTED Final   Haemophilus influenzae NOT DETECTED NOT DETECTED Final   Neisseria meningitidis NOT DETECTED NOT DETECTED Final   Pseudomonas aeruginosa NOT DETECTED NOT DETECTED Final   Stenotrophomonas maltophilia NOT DETECTED NOT DETECTED Final   Candida albicans NOT DETECTED NOT DETECTED Final   Candida auris NOT DETECTED NOT DETECTED Final   Candida glabrata NOT DETECTED NOT DETECTED Final   Candida krusei NOT DETECTED NOT DETECTED Final   Candida parapsilosis NOT DETECTED NOT DETECTED Final   Candida tropicalis NOT DETECTED NOT DETECTED Final   Cryptococcus neoformans/gattii NOT DETECTED NOT DETECTED Final   Meth resistant mecA/C and MREJ DETECTED (A) NOT DETECTED Final    Comment: CRITICAL RESULT CALLED TO, READ BACK BY AND VERIFIED WITH: PHARMD HALEY VON DOHLEN [phone] 1309 BY Berline Chough, MT Performed at [facility], [address] [place], [place] [address]   Resp panel by RT-PCR (RSV, Flu A&B, Covid) Anterior Nasal Swab     Status: None   Collection Time: 02/09/23 11:21 PM   Specimen: Anterior Nasal Swab  Result Value Ref Range Status   SARS Coronavirus 2 by RT PCR NEGATIVE NEGATIVE Final   Influenza A by PCR NEGATIVE NEGATIVE Final   Influenza B by PCR NEGATIVE NEGATIVE Final    Comment: (NOTE) The Xpert Xpress SARS-CoV-2/FLU/RSV plus assay is intended as an aid in the diagnosis  of influenza from Nasopharyngeal swab specimens and should not  be used as a sole basis for treatment. Nasal washings and aspirates are unacceptable for Xpert Xpress SARS-CoV-2/FLU/RSV testing.  Fact Sheet for Patients: BloggerCourse.com  Fact Sheet for Healthcare Providers: SeriousBroker.it  This test is not yet approved or cleared by the [place] FDA and has been authorized for detection and/or diagnosis of SARS-CoV-2 by FDA under an Emergency Use Authorization (EUA). This EUA will remain in effect (meaning this test can be used) for the duration of the COVID-19 declaration under Section 564(b)(1) of the Act, 21 U.S.C. section 360bbb-3(b)(1), unless the authorization is terminated or revoked.     Resp Syncytial Virus by PCR NEGATIVE NEGATIVE Final    Comment: (NOTE) Fact Sheet for Patients: BloggerCourse.com  Fact Sheet for Healthcare Providers: SeriousBroker.it  This test is not yet approved or cleared by the [place] FDA and has been authorized for detection and/or diagnosis of SARS-CoV-2 by FDA under an Emergency Use Authorization (EUA). This EUA will remain in effect (meaning this test can be used) for the duration of the COVID-19 declaration under Section 564(b)(1) of the Act, 21 U.S.C. section 360bbb-3(b)(1), unless the authorization is terminated or revoked.  Performed at [facility], [address] [place], [place] [address]   Culture, blood (Routine X 2) w Reflex to ID Panel     Status: None (Preliminary result)   Collection Time: 02/12/23 10:38 AM   Specimen: BLOOD  Result Value Ref Range Status   Specimen Description BLOOD BLOOD LEFT ARM  Final   Special Requests   Final    BOTTLES DRAWN AEROBIC AND ANAEROBIC Blood Culture adequate volume   Culture   Final    NO GROWTH 2 DAYS Performed at [facility], [address], [place],  [place] [address]    Report Status PENDING  Incomplete  Culture, blood (Routine X 2) w Reflex to ID Panel     Status: None (Preliminary result)   Collection Time: 02/12/23 10:38 AM   Specimen: BLOOD  Result Value Ref Range Status   Specimen Description BLOOD BLOOD RIGHT ARM  Final   Special Requests   Final    BOTTLES DRAWN AEROBIC AND ANAEROBIC Blood Culture adequate volume   Culture   Final    NO GROWTH 2 DAYS Performed at [facility], [address] [place], [place] [address]    Report Status PENDING  Incomplete     Serology:   Imaging: If present, new imagings (plain films, ct scans, and mri) have been personally visualized and interpreted; radiology reports have been reviewed. Decision making incorporated into the Impression / Recommendations.   7/31 mri l-spine 1. No specific evidence of infection in the lumbar spine. 2. Chronic collapse/fusion of the L1 and L2 vertebral bodies with kyphotic curvature at the thoracolumbar junction region. 3. 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 Disc degeneration with loss of disc height. Bilateral foraminal narrowing but without definite compression of the exiting L5 nerves. 4. Mild degenerative type endplate signal at T11-12, T12-L1, L3-4 and L4-5. Findings are likely degenerative. Unable to implicate infection at those levels based on these findings. 5. Bilateral sacroiliac edema. See results of pelvic MRI. 6. Markedly dilated bladder.   7/31 mr pelvis FINDINGS: Urinary Tract: The visualized distal ureters are not dilated. The urinary bladder is mildly distended without wall thickening or surrounding inflammation.   Bowel: No bowel wall thickening, distention or surrounding inflammation identified within the pelvis. Mildly prominent stool in the rectum.  Vascular/Lymphatic: There are small left common iliac and bilateral external iliac lymph nodes bilaterally which are nonspecific, although  likely reactive. No evidence of aneurysm or large vessel occlusion.   Reproductive: The prostate gland and seminal vesicles appear unremarkable.   Other: No evidence of pelvic ascites, focal extraluminal fluid collection or significant inflammation in the pelvis.   Musculoskeletal: Lumbar spine findings are dictated separately. There are new nondisplaced bilateral sacral insufficiency fractures, most obvious on the coronal images. There is surrounding sacral bone marrow edema. No evidence of sacroiliac widening, erosive disease or effusion. In addition, there are nondisplaced fractures of the superior pubic rami bilaterally. The left inferior pubic ramus demonstrates a mildly displaced fracture with associated marrow edema and fluid collections within the left adductor musculature measuring up to 4.5 cm on coronal image 27/2. There is surrounding adductor and gluteus muscular edema. No evidence of proximal femur fracture or significant hip joint effusion.   IMPRESSION: 1. Multiple acute/subacute pelvic fractures as described. There are nondisplaced fractures of the sacrum and superior pubic rami bilaterally with associated marrow edema. 2. Mildly displaced fracture of the left inferior pubic ramus with associated marrow edema, muscular edema and fluid collections within the left adductor musculature, likely hematomas. These fluid collections could be secondarily infected, although there is no specific evidence for that. 3. No evidence of septic arthritis or osteomyelitis. 4. Lumbar spine findings are dictated separately.    7/29 tte  1. Left ventricular ejection fraction, by estimation, is 65 to 70%. The  left ventricle has hyperdynamic function. The left ventricle has no  regional wall motion abnormalities. Left ventricular diastolic parameters  were normal.   2. Right ventricular systolic function is normal. The right ventricular  size is normal.   3. The mitral valve is  normal in structure. No evidence of mitral valve  regurgitation. No evidence of mitral stenosis.   4. The aortic valve is normal in structure. Aortic valve regurgitation is  not visualized. No aortic stenosis is present.   5. The inferior vena cava is normal in size with greater than 50%  respiratory variability, suggesting right atrial pressure of 3 mmHg.   Raymondo Band, MD [facility] [phone] pager    02/14/2023, 11:53 AM

## 2023-02-15 DIAGNOSIS — Z8739 Personal history of other diseases of the musculoskeletal system and connective tissue: Secondary | ICD-10-CM | POA: Diagnosis not present

## 2023-02-15 DIAGNOSIS — A419 Sepsis, unspecified organism: Secondary | ICD-10-CM | POA: Diagnosis not present

## 2023-02-15 DIAGNOSIS — R6520 Severe sepsis without septic shock: Secondary | ICD-10-CM | POA: Diagnosis not present

## 2023-02-15 DIAGNOSIS — L03119 Cellulitis of unspecified part of limb: Secondary | ICD-10-CM | POA: Diagnosis not present

## 2023-02-15 LAB — COMPREHENSIVE METABOLIC PANEL
ALT: 11 U/L (ref 0–44)
AST: 10 U/L — ABNORMAL LOW (ref 15–41)
Albumin: 2.4 g/dL — ABNORMAL LOW (ref 3.5–5.0)
Alkaline Phosphatase: 135 U/L — ABNORMAL HIGH (ref 38–126)
Anion gap: 8 (ref 5–15)
BUN: 17 mg/dL (ref 6–20)
CO2: 25 mmol/L (ref 22–32)
Calcium: 8.6 mg/dL — ABNORMAL LOW (ref 8.9–10.3)
Chloride: 98 mmol/L (ref 98–111)
Creatinine, Ser: 1.36 mg/dL — ABNORMAL HIGH (ref 0.61–1.24)
GFR, Estimated: >60 mL/min (ref >60)
Glucose, Bld: 118 mg/dL — ABNORMAL HIGH (ref 70–99)
Potassium: 3.9 mmol/L (ref 3.5–5.1)
Sodium: 131 mmol/L — ABNORMAL LOW (ref 135–145)
Total Bilirubin: 0.5 mg/dL (ref 0.3–1.2)
Total Protein: 7 g/dL (ref 6.5–8.1)

## 2023-02-15 LAB — CBC
HCT: 34.1 % — ABNORMAL LOW (ref 39.0–52.0)
Hemoglobin: 11.2 g/dL — ABNORMAL LOW (ref 13.0–17.0)
MCH: 28.6 pg (ref 26.0–34.0)
MCHC: 32.8 g/dL (ref 30.0–36.0)
MCV: 87 fL (ref 80.0–100.0)
Platelets: 331 10*3/uL (ref 150–400)
RBC: 3.92 MIL/uL — ABNORMAL LOW (ref 4.22–5.81)
RDW: 13.1 % (ref 11.5–15.5)
WBC: 10 10*3/uL (ref 4.0–10.5)
nRBC: 0 % (ref 0.0–0.2)

## 2023-02-15 LAB — GLUCOSE, CAPILLARY
Glucose-Capillary: 114 mg/dL — ABNORMAL HIGH (ref 70–99)
Glucose-Capillary: 101 mg/dL — ABNORMAL HIGH (ref 70–99)
Glucose-Capillary: 175 mg/dL — ABNORMAL HIGH (ref 70–99)
Glucose-Capillary: 147 mg/dL — ABNORMAL HIGH (ref 70–99)

## 2023-02-15 NOTE — TOC Initial Note (Signed)
Transition of Care [facility]) - Initial/Assessment Note    Patient Details  Name: Ronald Hampton MRN: [id number] Date of Birth: 09/03/1980  Transition of [facility]) CM/SW Contact:    Tom-Johnson, Hershal Coria, RN Phone Number: 02/15/2023, 11:22 AM  Clinical Narrative:                  CM spoke with patient at bedside about needs for post hospital transition.  Presented to the ED with BLE swelling,  Weakness,and Bumps on his Buttocks. Found to have Pelvic Fractures. Orthopedics following.  Patient states he was newly diagnosed with Multiple Myeloma last year and  went to Prison shortly after. He is recently released from Prison.    Patient states he lives alone, he is on probation out of Prison. His parents and family are supportive and will assist as needed.  Does not have a PCP, requests hospital f/u scheduled with [facility]. CM called and scheduled hospital f/u with [facility], info on AVS.  Home Health recommended, patient has no preference. CM called in referral to [facility] and Morrie Sheldon voiced acceptance, info on AVS.  Wheelchair ordered from [place] Verlon Au to deliver to patient at bedside prior to discharge.  Medical workup continues.   CM will continue to follow as patient progresses with care towards discharge.   Expected Discharge Plan: Home w Home Health Services Barriers to Discharge: Continued Medical Work up   Patient Goals and CMS Choice Patient states their goals for this hospitalization and ongoing recovery are:: To return home CMS Medicare.gov Compare Post Acute Care list provided to:: Patient Choice offered to / list presented to : Parent      Expected Discharge Plan and Services   Discharge Planning Services: CM Consult Post Acute Care Choice: Home Health Living arrangements for the past 2 months: Apartment                 DME Arranged: Community education officer wheelchair with seat cushion DME Agency: Merchant navy officer Date DME Agency  Contacted: 02/15/23 Time DME Agency Contacted: 1055 Representative spoke with at DME Agency: Verlon Au HH Arranged: PT, OT, RN, Nurse's Aide, Disease Management HH Agency: [facility]) Date HH Agency Contacted: 02/14/23 Time HH Agency Contacted: 1620 Representative spoke with at [facility] Agency: Morrie Sheldon  Prior Living Arrangements/Services Living arrangements for the past 2 months: Apartment Lives with:: Self Patient language and need for interpreter reviewed:: Yes Do you feel safe going back to the place where you live?: Yes      Need for Family Participation in Patient Care: Yes (Comment) Care giver support system in place?: Yes (comment)   Criminal Activity/Legal Involvement Pertinent to Current Situation/Hospitalization: No - Comment as needed  Activities of Daily Living Home Assistive Devices/Equipment: Wheelchair ADL Screening (condition at time of admission) Patient's cognitive ability adequate to safely complete daily activities?: Yes Is the patient deaf or have difficulty hearing?: No Does the patient have difficulty seeing, even when wearing glasses/contacts?: No Does the patient have difficulty concentrating, remembering, or making decisions?: No Patient able to express need for assistance with ADLs?: No Does the patient have difficulty dressing or bathing?: No Independently performs ADLs?: Yes (appropriate for developmental age) Does the patient have difficulty walking or climbing stairs?: Yes Weakness of Legs: Both Weakness of Arms/Hands: None  Permission Sought/Granted Permission sought to share information with : Case Manager, Magazine features editor, Family Supports Permission granted to share information with : Yes, Verbal Permission Granted  Emotional Assessment Appearance:: Appears stated age Attitude/Demeanor/Rapport: Gracious, Engaged Affect (typically observed): Accepting, Appropriate, Calm, Hopeful, Pleasant Orientation:  : Oriented to Self, Oriented to Place, Oriented to  Time, Oriented to Situation Alcohol / Substance Use: Not Applicable Psych Involvement: No (comment)  Admission diagnosis:  SIRS (systemic inflammatory response syndrome) (HCC) [R65.10] Patient Active Problem List   Diagnosis Date Noted   Lower extremity cellulitis 02/10/2023   Severe sepsis (HCC) 02/10/2023   Pressure injury of skin 06/04/2022   Fall 06/03/2022   Acute kidney injury superimposed on chronic kidney disease (HCC) 06/03/2022   COVID-19 virus infection 06/03/2022   Hypercalcemia 06/03/2022   Monoclonal gammopathy 06/03/2022   Constipation 06/03/2022   History of discitis 06/03/2022   History of bacteremia 06/03/2022   History of osteomyelitis 06/03/2022   Transaminitis 06/03/2022   Hyperbilirubinemia 06/03/2022   Normocytic anemia 06/03/2022   Depression 06/03/2022   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   PCP:  Patient, No Pcp Per Pharmacy:   [address] [place], [place] - [address] [place] [address] Phone: [phone] Fax: [phone]  [id number] [place], [place] - [address] AT [facility] [address] [place] [address] Phone: [phone] Fax: [phone]  [facility] [address] [place]  [address] Phone: [phone] Fax: [phone]     Social Determinants of Health (SDOH) Social History: SDOH Screenings   Food Insecurity: No Food Insecurity (02/10/2023)  Housing: Low Risk  (02/10/2023)  Transportation Needs: No Transportation Needs (02/10/2023)  Utilities: Not At Risk (02/10/2023)  Alcohol Screen: Low Risk  (11/10/2017)  Recent Concern: Alcohol Screen - Medium Risk (10/22/2017)  Tobacco Use: Low Risk  (02/09/2023)   SDOH Interventions: Transportation Interventions: Intervention Not Indicated, Patient Resources (Friends/Family), Inpatient TOC   Readmission Risk Interventions    02/15/2023   11:19 AM  Readmission Risk Prevention Plan  Transportation Screening Complete  PCP or Specialist Appt within 5-7 Days Complete  Home Care Screening Complete  Medication Review (RN CM) Referral to Pharmacy

## 2023-02-15 NOTE — Progress Notes (Signed)
Ortho Trauma Note  Please see attestation for consult note from 02/14/2023.  Will plan for nonoperative management of his pelvic fractures.  Patient may be weightbearing as tolerated.  No current indication for surgical intervention at this time.  Patient will have a diet.  Follow-up on an outpatient basis in about 2 to 3 weeks.  Roby Lofts, MD [facility] [phone] (office) [email or web address]

## 2023-02-15 NOTE — Progress Notes (Cosign Needed)
    Durable Medical Equipment  (From admission, onward)           Start     Ordered   02/15/23 1022  For home use only DME lightweight manual wheelchair with seat cushion  Once       Comments: Patient suffers from Pelvic Fractures with weight bearing as tolerated restrictions which impairs their ability to perform daily activities like bathing, dressing, grooming, and toileting in the home.  A cane, crutch, or walker will not resolve issue with performing activities of daily living. A wheelchair will allow patient to safely perform daily activities. Patient is not able to propel themselves in the home using a standard weight wheelchair due to endurance and general weakness. Patient can self propel in the lightweight wheelchair. Length of need 12 months . Accessories: elevating leg rests (ELRs), wheel locks, extensions and anti-tippers.   02/15/23 1026

## 2023-02-15 NOTE — Progress Notes (Signed)
PT Cancellation Note  Patient Details Name: Ronald Hampton MRN: [id number] DOB: 1980-12-22   Cancelled Treatment:    Reason Eval/Treat Not Completed: (P) Pain limiting ability to participate (pt defers OOB mobility until after 2pm due to fatigue, requesting pain meds, RN notified.) Will continue efforts per PT plan of care as schedule permits.    M  02/15/2023, 12:12 PM

## 2023-02-15 NOTE — Plan of Care (Signed)

## 2023-02-15 NOTE — Evaluation (Signed)
Occupational Therapy Evaluation Patient Details Name: Ronald Hampton MRN: [id number] DOB: 1980/08/05 Today's Date: 02/15/2023   History of Present Illness 42 y.o. male presents to [facility] hospital on 02/09/2023 with constipation and nausea, as well as multiple recent falls. Pt found to be septic with fever, tachycardia and tachypnea in ED. Pt with a history of osteomyelitis but refusing CT scan. MRIs on 7/31 revealed multiple bilateral sacral insufficiency fractures and multiple chronic deficits of the lumbar and thoracic spine with  compression of the exiting L5 nerves. PMH includes bipolar disorder, depression, PTSD, multiple myeloma, CKD, CVA, HTN, DMII, endocarditis, osteomyelitis.   Clinical Impression   At baseline, pt completes ADLs and IADLs Independently, drives, and completes functional transfers/mobility with a SPC or Rollator Mod I. Pt now presents with decreased pain in Left pelvic area limiting participation in functional tasks, decreased activity tolerance, and decreased safety and independence with ADLs and functional transfers/mobility. OT evaluation limited this day by pt's pain, fatigue, and mild agitation and anxiety. Pt currently demonstrates ability to complete UB ADLs Independent to Set up assist from bed level and LB ADLs with Max to Total assist from bed level. Pt is currently WBAT to B LE. However, pt declined transferring EOB, attempt at sit<->stand transfer, and all other OOB activity this session secondary to pain and fatigue. Pt will benefit from acute skilled OT services to address deficits outlined below and increase safety and independence with ADLs, bed mobility during/in preparation for ADLs, and functional transfers. Pt expected to make good progress once pain management is improved. Post acute discharge, pt will benefit from continued skilled OT services in the home to maximize rehab potential. However, discharge plan may need to be updated pending pt participation  in skilled OT sessions and progress toward goals.     Recommendations for follow up therapy are one component of a multi-disciplinary discharge planning process, led by the attending physician.  Recommendations may be updated based on patient status, additional functional criteria and insurance authorization.   Assistance Recommended at Discharge Frequent or constant Supervision/Assistance  Patient can return home with the following Two people to help with walking and/or transfers;A lot of help with bathing/dressing/bathroom;Assistance with cooking/housework;Assist for transportation;Help with stairs or ramp for entrance    Functional Status Assessment  Patient has had a recent decline in their functional status and demonstrates the ability to make significant improvements in function in a reasonable and predictable amount of time.  Equipment Recommendations  BSC/3in1;Tub/shower bench    Recommendations for Other Services       Precautions / Restrictions Precautions Precautions: Fall Restrictions Weight Bearing Restrictions: Yes (B LE WBAT)      Mobility Bed Mobility Overal bed mobility: Needs Assistance             General bed mobility comments: Pt declined bed mobility this session secondary to pain and fatigue. Per PT note on 7/31, pt required Min assist and increased time for bed mobility.    Transfers Overall transfer level: Needs assistance                 General transfer comment: Pt now WBAT in B LE. However, pt continues to decline attempt at sit<->stand transfer this session secondary to pain and fatigue.      Balance  ADL either performed or assessed with clinical judgement   ADL Overall ADL's : Needs assistance/impaired Eating/Feeding: Independent;Bed level   Grooming: Independent;Bed level   Upper Body Bathing: Set up;Bed level   Lower Body Bathing: Maximal assistance;Bed level;Cueing  for compensatory techniques   Upper Body Dressing : Modified independent;Bed level   Lower Body Dressing: Maximal assistance;Bed level;Cueing for compensatory techniques     Toilet Transfer Details (indicate cue type and reason): Pt declined this session secondary to pain. Toileting- Clothing Manipulation and Hygiene: Total assistance;Bed level         General ADL Comments: Pt funcitonal level limited by pain and fatigue this session.     Vision Baseline Vision/History: 0 No visual deficits Patient Visual Report: No change from baseline       Perception     Praxis      Pertinent Vitals/Pain Pain Assessment Pain Assessment: 0-10 Pain Score: 8  Pain Location: L hip/thigh Pain Descriptors / Indicators: Discomfort, Grimacing, Guarding, Sharp, Shooting Pain Intervention(s): Limited activity within patient's tolerance, Monitored during session, Patient requesting pain meds-RN notified (Pt declined repositioning in the bed)     Hand Dominance Right   Extremity/Trunk Assessment Upper Extremity Assessment Upper Extremity Assessment: Overall WFL for tasks assessed   Lower Extremity Assessment Lower Extremity Assessment: Defer to PT evaluation       Communication Communication Communication: No difficulties   Cognition Arousal/Alertness: Awake/alert Behavior During Therapy: Agitated, Flat affect, Restless Overall Cognitive Status: Within Functional Limits for tasks assessed                                 General Comments: Pt initially refusing participation in skilled OT eval, then agreeing to participation in bed level. Pt educated in role of skilled rehab services and importance of sitting up and OOB activity with pt reporting understanding and continuing to refuse sitting EOB or OOB activity until "after 2 pm" secondary to pain and pt stating he did not sleep well last night.     General Comments  Pt presents with mild aggitation and anxiety this day  rating pain at 8/10 and stating he is fatigued due to not sleeping well last night. Pt states he "will get out of bed after 2 pm." OT to reattempt to see pt later this day to address bed mobility, balance, and functional transfers as available/appropriate. If unable to see pt again this day, OT to continue to assess pt during future skilled OT sessions.    Exercises     Shoulder Instructions      Home Living Family/patient expects to be discharged to:: Private residence Living Arrangements: Alone Available Help at Discharge: Family;Friend(s);Available PRN/intermittently Type of Home: Apartment Home Access: Level entry     Home Layout: One level     Bathroom Shower/Tub: Chief Strategy Officer: Standard Bathroom Accessibility: No   Home Equipment: Cane - single point;Rollator (4 wheels)   Additional Comments: left SNF rehab for his condo that is not w/c accessible. Pt reports he cannot walk or stand so was unable to get around at home      Prior Functioning/Environment Prior Level of Function : Independent/Modified Independent             Mobility Comments: amb household distances with rollator vs cane ADLs Comments: Independent, driving        OT Problem List: Decreased activity tolerance;Decreased knowledge of use of DME or AE;Pain  OT Treatment/Interventions: Self-care/ADL training;Energy conservation;DME and/or AE instruction;Therapeutic activities;Patient/family education;Balance training    OT Goals(Current goals can be found in the care plan section) Acute Rehab OT Goals Patient Stated Goal: To return home independent OT Goal Formulation: With patient Time For Goal Achievement: 03/01/23 Potential to Achieve Goals: Fair ADL Goals Pt Will Perform Lower Body Bathing: with adaptive equipment;sitting/lateral leans;with min guard assist;sit to/from stand Pt Will Perform Lower Body Dressing: with min guard assist;with adaptive  equipment;sitting/lateral leans;sit to/from stand Pt Will Transfer to Toilet: with min guard assist;ambulating;bedside commode (with least restrictive AD) Pt Will Perform Toileting - Clothing Manipulation and hygiene: with min guard assist;sitting/lateral leans;sit to/from stand Pt Will Perform Tub/Shower Transfer: with min guard assist;ambulating;tub bench (with least restrictive AD) Additional ADL Goal #1: Patient will demonstrate ability to Independently state 4 energy conservation techniques to increase safety and independence with ADLs, IADLs, and funcitonl mobility in the home.  OT Frequency: Min 2X/week    Co-evaluation              AM-PAC OT "6 Clicks" Daily Activity     Outcome Measure Help from another person eating meals?: None Help from another person taking care of personal grooming?: None (in sitting) Help from another person toileting, which includes using toliet, bedpan, or urinal?: Total Help from another person bathing (including washing, rinsing, drying)?: A Lot Help from another person to put on and taking off regular upper body clothing?: None Help from another person to put on and taking off regular lower body clothing?: A Lot 6 Click Score: 17   End of Session Nurse Communication: Mobility status;Patient requests pain meds;Other (comment) (Pt with limited participation in skilled OT session. PT to attempt to see pt after 2 pm per his request and OT to return later this day as schedule allows/pt is appropriate.)  Activity Tolerance: Patient limited by fatigue;Patient limited by pain;Other (comment) (Pt limited by mild aggitation and anxiety.) Patient left: in bed;with call bell/phone within reach;with bed alarm set  OT Visit Diagnosis: Pain;Other (comment) (Decreased activity tolerance)                Time: 0623-7628 OT Time Calculation (min): 17 min Charges:  OT General Charges $OT Visit: 1 Visit OT Evaluation $OT Eval Low Complexity: 1 Low   "Orson Eva., OTR/L, MA Acute Rehab [phone]   [name] 02/15/2023, 12:41 PM

## 2023-02-15 NOTE — Progress Notes (Signed)
[facility]  PROGRESS NOTE  Ronald Hampton Yaphank III ZOX:[id number] DOB: [birth year] DOA: 02/09/2023 PCP: Patient, No Pcp Per   Brief HPI:   42 y.o. male with PMH of multiple myeloma, CKD, depression, PTSD, bipolar disorder, CVA, HTN, polysubstance use/IVDU, DM-2, endocarditis, osteomyelitis and discitis presented with 3 weeks of left lower quadrant abdominal pain, left leg pain hip pain, 2 years of chronic low back pain that worsened, was having malaise, intermittent numbness in feet, ongoing IVDU methamphetamine last used 2 weeks prior to admission.   Patient is not a great historian.  He reports coming to the hospital due to difficulty using the bathroom for 3 days. He also reports associated nausea.  No other complaints until specifically asked for symptoms.  He admits to fever with a temperature of 100.8 F yesterday.  He also admits to abdominal pain over LLQ for 2 to 3 weeks.  He admits to lower back pain for 2 years without acute change.     Assessment/Plan:   Severe sepsis due to bilateral lower extremity cellulitis, POA -History of IE/osteomyelitis and discitis with question of functional paraplegia: MSSA bacteremia this admission.ID following given history of previous IVDA is concern -2D echo 7/29 with EF 65 to 70% no RWMA normal RV systolic function mitral valve is normal no regurgitation aortic valve is normal in structur  He refused CT chest, abdomen, pelvis and left leg in ED. but is agreeable for MRI once monitoring device in his leg is removed by his probation officer.  -Monitoring device was removed, underwent MRI of lumbar spine and pelvis, MRI lumbar spine was unremarkable, MRI pelvis shows pelvic fracture and fluid collection in the left abductor muscles -Orthopedic surgery consulted -Vancomycin changed to daptomycin per ID, will need 8 weeks of antibiotics -As per ID ID f/u with me on 8/22 @ 2pm Patient doesn't appear to be willing to stay for the 2 weeks at least to  get iv abx -- he is not an opat candidate so plan to transition to PO abx linezolid when he leaves, but recommend to get at least 10-14 days of iv abx Please have linezolid po 600 mg bid for 6 weeks if he decides to leave before the 2 weeks mark, and give a dose of oritavancin  Pelvic fracture MRI pelvis shows pelvic fracture and fluid collection in the left abductor muscles -Orthopedic surgery consulted -No plan for intervention, PT evaluation ordered  Bilateral lower extremity weakness/recurrent fall Difficulty moving weight on the left side: W/ chronic back pain over 2 years unchanged from baseline.Has history of discitis/osteomyelitis. Continue pain control, continue PT OT.   CKD stage IIIa -Creatinine at baseline  Hyperphosphatemia -Replete  Hyponatremia -Stable, sodium has been stable around 130  Depression/bipolar disorder -Stable  Medications     insulin aspart  0-15 Units Subcutaneous TID WC   insulin aspart  0-5 Units Subcutaneous QHS   polyethylene glycol  17 g Oral BID   senna-docusate  2 tablet Oral BID     Data Reviewed:   CBG:  Recent Labs  Lab 02/14/23 1107 02/14/23 1649 02/14/23 2103 02/15/23 0812 02/15/23 1208  GLUCAP 146* 139* 126* 114* 101*    SpO2: 100 % O2 Flow Rate (L/min): 98.9 L/min    Vitals:   02/15/23 0500 02/15/23 0541 02/15/23 0814 02/15/23 1214  BP:  (!) 140/86 (!) 135/90 (!) 155/100  Pulse:  98 94 88  Resp:  19 18 16   Temp:  99 F (37.2 C) 98.1 F (36.7 C) (!)  100.5 F (38.1 C)  TempSrc:   Oral Oral  SpO2:  99% 97% 100%  Weight: 93.7 kg     Height:          Data Reviewed:  Basic Metabolic Panel: Recent Labs  Lab 02/10/23 0507 02/11/23 0316 02/11/23 1916 02/12/23 0014 02/13/23 0523 02/14/23 0248 02/15/23 0745  NA 129* 128*  --  129* 132* 130* 131*  K 3.4* 3.6  --  4.0 4.9 4.3 3.9  CL 97* 98  --  98 99 97* 98  CO2 22 23  --  23 22 25 25   GLUCOSE 156* 162*  --  196* 139* 130* 118*  BUN 17 15  --  11 13  14 17   CREATININE 1.60* 1.58*  --  1.34* 1.42* 1.37* 1.36*  CALCIUM 8.2* 8.4*  --  8.1* 8.8* 8.5* 8.6*  MG 1.6* 1.8  --   --   --   --   --   PHOS  --  1.4* 2.5  --   --   --   --     CBC: Recent Labs  Lab 02/09/23 2046 02/10/23 0507 02/11/23 0316 02/12/23 0014 02/13/23 0523 02/14/23 0248 02/15/23 0745  WBC 10.9* 11.5* 16.4* 14.9* 11.1* 9.5 10.0  NEUTROABS 7.8* 8.8* 12.9*  --   --   --   --   HGB 12.4* 10.9* 10.5* 11.2* 10.8* 10.5* 11.2*  HCT 36.5* 32.1* 30.7* 32.5* 33.0* 31.5* 34.1*  MCV 87.3 86.5 84.3 84.6 86.6 86.5 87.0  PLT 305 209 214 229 321 345 331    LFT Recent Labs  Lab 02/10/23 0507 02/11/23 0316 02/12/23 0014 02/13/23 0523 02/14/23 0248 02/15/23 0745  AST 14*  --  10* 11* 13* 10*  ALT 12  --  10 10 11 11   ALKPHOS 158*  --  153* 147* 137* 135*  BILITOT 1.2  --  0.8 0.4 0.6 0.5  PROT 6.2*  --  6.3* 6.8 6.7 7.0  ALBUMIN 2.5* 2.4* 2.3* 2.5* 2.3* 2.4*     Antibiotics: Anti-infectives (From admission, onward)    Start     Dose/Rate Route Frequency Ordered Stop   02/14/23 0000  linezolid (ZYVOX) 600 MG tablet        600 mg Oral 2 times daily 02/14/23 1301 03/28/23 2359   02/13/23 1500  DAPTOmycin (CUBICIN) 650 mg in sodium chloride 0.9 % IVPB        8 mg/kg  79.9 kg (Adjusted) 126 mL/hr over 30 Minutes Intravenous Daily 02/13/23 1409     02/10/23 2200  vancomycin (VANCOREADY) IVPB 1750 mg/350 mL  Status:  Discontinued        1,750 mg 175 mL/hr over 120 Minutes Intravenous Every 24 hours 02/10/23 0754 02/10/23 1355   02/10/23 2200  vancomycin (VANCOREADY) IVPB 1750 mg/350 mL  Status:  Discontinued        1,750 mg 175 mL/hr over 120 Minutes Intravenous Every 24 hours 02/10/23 1402 02/13/23 1409   02/10/23 1500  ceFAZolin (ANCEF) IVPB 2g/100 mL premix  Status:  Discontinued        2 g 200 mL/hr over 30 Minutes Intravenous Every 8 hours 02/10/23 1405 02/10/23 1413   02/10/23 1000  metroNIDAZOLE (FLAGYL) IVPB 500 mg  Status:  Discontinued        500  mg 100 mL/hr over 60 Minutes Intravenous Every 12 hours 02/10/23 0728 02/10/23 1315   02/10/23 0800  ceFEPIme (MAXIPIME) 2 g in sodium chloride 0.9 % 100 mL IVPB  Status:  Discontinued        2 g 200 mL/hr over 30 Minutes Intravenous Every 8 hours 02/10/23 0754 02/10/23 1315   02/09/23 2130  ceFEPIme (MAXIPIME) 2 g in sodium chloride 0.9 % 100 mL IVPB        2 g 200 mL/hr over 30 Minutes Intravenous  Once 02/09/23 2121 02/09/23 2253   02/09/23 2130  metroNIDAZOLE (FLAGYL) IVPB 500 mg        500 mg 100 mL/hr over 60 Minutes Intravenous  Once 02/09/23 2121 02/10/23 0045   02/09/23 2130  vancomycin (VANCOCIN) IVPB 1000 mg/200 mL premix  Status:  Discontinued        1,000 mg 200 mL/hr over 60 Minutes Intravenous  Once 02/09/23 2121 02/09/23 2123   02/09/23 2130  vancomycin (VANCOREADY) IVPB 2000 mg/400 mL        2,000 mg 200 mL/hr over 120 Minutes Intravenous  Once 02/09/23 2123 02/10/23 0426        DVT prophylaxis: SCDs  Code Status: Full code  Family Communication: No family at bedside   CONSULTS    Subjective   Pelvic pain better controlled.  Orthopedic surgery has seen the patient, no plan for surgical intervention.   Objective    Physical Examination:  General-appears in no acute distress Heart-S1-S2, regular, no murmur auscultated Lungs-clear to auscultation bilaterally, no wheezing or crackles auscultated Abdomen-soft, nontender, no organomegaly Extremities-no edema in the lower extremities Neuro-alert, oriented x3, no focal deficit noted   Status is: Inpatient:          Meredeth Ide   [facility] If 7PM-7AM, please contact night-coverage at [email or web address], Office  [phone]   02/15/2023, 2:19 PM  LOS: 5 days

## 2023-02-15 NOTE — Progress Notes (Signed)
PT Cancellation Note  Patient Details Name: Ronald Hampton MRN: [id number] DOB: [birth year]   Cancelled Treatment:    Reason Eval/Treat Not Completed: (P) Other (comment) (pt sleeping on second PT attempt, did not awaken to loud voice. Of note, in initial refusal pt had stated he did not sleep previous evening and was wanting to nap, did not want to be woken up from nap.) Will continue efforts per PT plan of care as schedule permits.   Dorathy Kinsman  02/15/2023, 5:05 PM

## 2023-02-16 DIAGNOSIS — L03119 Cellulitis of unspecified part of limb: Secondary | ICD-10-CM | POA: Diagnosis not present

## 2023-02-16 DIAGNOSIS — A419 Sepsis, unspecified organism: Secondary | ICD-10-CM | POA: Diagnosis not present

## 2023-02-16 DIAGNOSIS — R6520 Severe sepsis without septic shock: Secondary | ICD-10-CM | POA: Diagnosis not present

## 2023-02-16 DIAGNOSIS — Z8739 Personal history of other diseases of the musculoskeletal system and connective tissue: Secondary | ICD-10-CM | POA: Diagnosis not present

## 2023-02-16 LAB — CBC
HCT: 34.4 % — ABNORMAL LOW (ref 39.0–52.0)
Hemoglobin: 11.6 g/dL — ABNORMAL LOW (ref 13.0–17.0)
MCH: 29.1 pg (ref 26.0–34.0)
MCHC: 33.7 g/dL (ref 30.0–36.0)
MCV: 86.2 fL (ref 80.0–100.0)
Platelets: 362 10*3/uL (ref 150–400)
RBC: 3.99 MIL/uL — ABNORMAL LOW (ref 4.22–5.81)
RDW: 12.9 % (ref 11.5–15.5)
WBC: 9.6 10*3/uL (ref 4.0–10.5)
nRBC: 0 % (ref 0.0–0.2)

## 2023-02-16 LAB — GLUCOSE, CAPILLARY
Glucose-Capillary: 125 mg/dL — ABNORMAL HIGH (ref 70–99)
Glucose-Capillary: 127 mg/dL — ABNORMAL HIGH (ref 70–99)
Glucose-Capillary: 145 mg/dL — ABNORMAL HIGH (ref 70–99)
Glucose-Capillary: 138 mg/dL — ABNORMAL HIGH (ref 70–99)

## 2023-02-16 LAB — COMPREHENSIVE METABOLIC PANEL
ALT: 11 U/L (ref 0–44)
AST: 10 U/L — ABNORMAL LOW (ref 15–41)
Albumin: 2.5 g/dL — ABNORMAL LOW (ref 3.5–5.0)
Alkaline Phosphatase: 137 U/L — ABNORMAL HIGH (ref 38–126)
Anion gap: 10 (ref 5–15)
BUN: 18 mg/dL (ref 6–20)
CO2: 24 mmol/L (ref 22–32)
Calcium: 8.8 mg/dL — ABNORMAL LOW (ref 8.9–10.3)
Chloride: 98 mmol/L (ref 98–111)
Creatinine, Ser: 1.28 mg/dL — ABNORMAL HIGH (ref 0.61–1.24)
GFR, Estimated: >60 mL/min (ref >60)
Glucose, Bld: 157 mg/dL — ABNORMAL HIGH (ref 70–99)
Potassium: 4 mmol/L (ref 3.5–5.1)
Sodium: 132 mmol/L — ABNORMAL LOW (ref 135–145)
Total Bilirubin: 0.5 mg/dL (ref 0.3–1.2)
Total Protein: 7.5 g/dL (ref 6.5–8.1)

## 2023-02-16 NOTE — Progress Notes (Signed)
PT Cancellation Note  Patient Details Name: Ronald Hampton MRN: [id number] DOB: [birth year]   Cancelled Treatment:    Reason Eval/Treat Not Completed: Patient declined, no reason specified. Pt refuses PT treatment at this time, reports his aunt is coming to provide him dinner and he doesn't want to hold her up. PT attempts to encourage trasfer to recliner to eat however pt declines. Pt has consistently declined any attempts at out of bed mobilization this admission. PT denies the need for PT treatment at this time, reports he has been standing and ambulating this admission despite PT seeing no evidence of this. Due to consistent refusal to participate, acute PT will sign off. If the pt becomes agreeable to participate in out of bed mobility please re-consult.   Arlyss Gandy 02/16/2023, 4:02 PM

## 2023-02-16 NOTE — Progress Notes (Addendum)
Occupational Therapy Treatment Patient Details Name: Ronald Hampton MRN: [id number] DOB: 02/12/1981 Today's Date: 02/16/2023   History of present illness 42 y.o. male presents to [facility] hospital on 02/09/2023 with constipation and nausea, as well as multiple recent falls. Pt found to be septic with fever, tachycardia and tachypnea in ED. Pt with a history of osteomyelitis but refusing CT scan. MRIs on 7/31 revealed multiple bilateral sacral insufficiency fractures and multiple chronic deficits of the lumbar and thoracic spine with  compression of the exiting L5 nerves. PMH includes bipolar disorder, depression, PTSD, multiple myeloma, CKD, CVA, HTN, DMII, endocarditis, osteomyelitis.   OT comments  Pt. Seen for skilled OT treatment session.  Session limited secondary to pt. Stated reasons for not being able to engage in initially agreed upon session.  Agreeable to eob and was able to complete lb dressing task before wanting to return to bed.  Attempt to progress adls and participation next session.     Recommendations for follow up therapy are one component of a multi-disciplinary discharge planning process, led by the attending physician.  Recommendations may be updated based on patient status, additional functional criteria and insurance authorization.    Assistance Recommended at Discharge Frequent or constant Supervision/Assistance  Patient can return home with the following  Two people to help with walking and/or transfers;A lot of help with bathing/dressing/bathroom;Assistance with cooking/housework;Assist for transportation;Help with stairs or ramp for entrance   Equipment Recommendations       Recommendations for Other Services      Precautions / Restrictions Precautions Precautions: Fall       Mobility Bed Mobility Overal bed mobility: Needs Assistance Bed Mobility: Supine to Sit, Sit to Supine     Supine to sit: Supervision Sit to supine: Supervision         Transfers                         Balance                                           ADL either performed or assessed with clinical judgement   ADL Overall ADL's : Needs assistance/impaired                     Lower Body Dressing: Set up;Sitting/lateral leans Lower Body Dressing Details (indicate cue type and reason): able to cross R le over L knee to don sock, for LLE pt. able to turn sideways in bed and reach foot that way to don sock               General ADL Comments: see cognition section for further detail.  session limited to eob lb dressing task today    Extremity/Trunk Assessment              Vision       Perception     Praxis      Cognition Arousal/Alertness: Awake/alert Behavior During Therapy: Agitated, Flat affect, Restless Overall Cognitive Status: Within Functional Limits for tasks assessed                                 General Comments: initial agreement to participation then after review of plans for session (including tasks he mentioned and agreed to) as it began  he then refused and provided reasons and rationale for each thing he could not do.  this cont. throughout attempts and efforts to begin session.  stating he did not see the purpose in doing these things over and over if he knew how to, so i agreed and suggested if he could demo all current goals we could sign off per his request. he declined that option and cont. to provide reasons he could not go through indicated goals and plans for session.  he would state that he wanted to wash up later when aunt got here to assist so i rec. b.room transfer practice and up to recliner so they could change bed. he states he can not demo b.room transfer until his wash up. cont. to state he didnt understand exactly what i needed to see from him.  attempted review again of our puprose and goals. he would agree to sit up eob and go to recliner but then state  again he could not until his wash up but did not want wash up now. each agreed task was met with a reason shortly after agreeing as to why he now could not depending on various reasons that lead to not doing anything until aunt came for wash up.        Exercises      Shoulder Instructions       General Comments      Pertinent Vitals/ Pain       Pain Assessment Pain Assessment: 0-10 Pain Score: 6  Pain Location: L hip/thigh Pain Descriptors / Indicators: Discomfort, Grimacing, Guarding, Sharp, Shooting Pain Intervention(s): Limited activity within patient's tolerance, Monitored during session, Premedicated before session (meds provided as session was starting)  Home Living                                          Prior Functioning/Environment              Frequency  Min 2X/week        Progress Toward Goals  OT Goals(current goals can now be found in the care plan section)  Progress towards OT goals: Progressing toward goals     Plan Discharge plan remains appropriate    Co-evaluation                 AM-PAC OT "6 Clicks" Daily Activity     Outcome Measure   Help from another person eating meals?: None Help from another person taking care of personal grooming?: None Help from another person toileting, which includes using toliet, bedpan, or urinal?: Total Help from another person bathing (including washing, rinsing, drying)?: A Lot Help from another person to put on and taking off regular upper body clothing?: None Help from another person to put on and taking off regular lower body clothing?: A Lot 6 Click Score: 17    End of Session    OT Visit Diagnosis: Pain;Other (comment)   Activity Tolerance Other (comment) (session limited as pt. stated a specific order for how he wants to complete adls today that conflicted with my attempts for tx. session)   Patient Left in bed;with call bell/phone within reach   Nurse Communication  Other (comment) (rn stated bed alarm not needed when i checked with her)        Time: 1610-9604 OT Time Calculation (min): 14 min  Charges: OT General Charges $OT Visit:  1 Visit OT Treatments $Self Care/Home Management : 8-22 mins  Boneta Lucks, COTA/L Acute Rehabilitation [phone]   Alessandra Bevels Lorraine-COTA/L 02/16/2023, 11:43 AM

## 2023-02-16 NOTE — Plan of Care (Signed)
  Problem: Clinical Measurements: Goal: Respiratory complications will improve Outcome: Progressing Goal: Cardiovascular complication will be avoided Outcome: Progressing   Problem: Nutrition: Goal: Adequate nutrition will be maintained Outcome: Progressing   Problem: Coping: Goal: Level of anxiety will decrease Outcome: Progressing   Problem: Elimination: Goal: Will not experience complications related to bowel motility Outcome: Progressing Goal: Will not experience complications related to urinary retention Outcome: Progressing   Problem: Pain Managment: Goal: General experience of comfort will improve Outcome: Progressing   Problem: Activity: Goal: Risk for activity intolerance will decrease Outcome: Not Progressing

## 2023-02-16 NOTE — Progress Notes (Signed)
[facility]  PROGRESS NOTE  Ronald Hampton QIO:[id number] DOB: 1981/01/25 DOA: 02/09/2023 PCP: Patient, No Pcp Per   Brief HPI:   42 y.o. male with PMH of multiple myeloma, CKD, depression, PTSD, bipolar disorder, CVA, HTN, polysubstance use/IVDU, DM-2, endocarditis, osteomyelitis and discitis presented with 3 weeks of left lower quadrant abdominal pain, left leg pain hip pain, 2 years of chronic low back pain that worsened, was having malaise, intermittent numbness in feet, ongoing IVDU methamphetamine last used 2 weeks prior to admission.   Patient is not a great historian.  He reports coming to the hospital due to difficulty using the bathroom for 3 days. He also reports associated nausea.  No other complaints until specifically asked for symptoms.  He admits to fever with a temperature of 100.8 F yesterday.  He also admits to abdominal pain over LLQ for 2 to 3 weeks.  He admits to lower back pain for 2 years without acute change.     Assessment/Plan:   Severe sepsis due to bilateral lower extremity cellulitis, POA -History of IE/osteomyelitis and discitis with question of functional paraplegia: MSSA bacteremia this admission.ID following given history of previous IVDA is concern -2D echo 7/29 with EF 65 to 70% no RWMA normal RV systolic function mitral valve is normal no regurgitation aortic valve is normal in structur  He refused CT chest, abdomen, pelvis and left leg in ED. but is agreeable for MRI once monitoring device in his leg is removed by his probation officer.  -Monitoring device was removed, underwent MRI of lumbar spine and pelvis, MRI lumbar spine was unremarkable, MRI pelvis shows pelvic fracture and fluid collection in the left abductor muscles -Orthopedic surgery consulted -Vancomycin changed to daptomycin per ID, will need 8 weeks of antibiotics -As per ID ID f/u with me on 8/22 @ 2pm Patient doesn't appear to be willing to stay for the 2 weeks at least to  get iv abx -- he is not an opat candidate so plan to transition to PO abx linezolid when he leaves, but recommend to get at least 10-14 days of iv abx Please have linezolid po 600 mg bid for 6 weeks if he decides to leave before the 2 weeks mark, and give a dose of oritavancin  Pelvic fracture MRI pelvis shows pelvic fracture and fluid collection in the left abductor muscles -Orthopedic surgery consulted -No plan for intervention, PT evaluation ordered  Bilateral lower extremity weakness/recurrent fall Difficulty moving weight on the left side: W/ chronic back pain over 2 years unchanged from baseline.Has history of discitis/osteomyelitis. Continue pain control, continue PT OT.   CKD stage IIIa -Creatinine at baseline  Hyperphosphatemia -Replete  Hyponatremia -Stable, sodium has been stable around 130  Depression/bipolar disorder -Stable  Medications     insulin aspart  0-15 Units Subcutaneous TID WC   insulin aspart  0-5 Units Subcutaneous QHS   polyethylene glycol  17 g Oral BID   senna-docusate  2 tablet Oral BID     Data Reviewed:   CBG:  Recent Labs  Lab 02/15/23 0812 02/15/23 1208 02/15/23 1637 02/15/23 2126 02/16/23 0739  GLUCAP 114* 101* 175* 147* 125*    SpO2: 97 % O2 Flow Rate (L/min): 98.9 L/min    Vitals:   02/15/23 2008 02/16/23 0411 02/16/23 0500 02/16/23 0824  BP: (!) 136/96 123/81  121/87  Pulse: 91 87  98  Resp: 17 16  18   Temp: 98 F (36.7 C) 98.4 F (36.9 C)  98.4 F (  36.9 C)  TempSrc: Oral Oral  Oral  SpO2: 98% 97%  97%  Weight:   93.4 kg   Height:          Data Reviewed:  Basic Metabolic Panel: Recent Labs  Lab 02/10/23 0507 02/11/23 0316 02/11/23 1916 02/12/23 0014 02/13/23 0523 02/14/23 0248 02/15/23 0745 02/16/23 0208  NA 129* 128*  --  129* 132* 130* 131* 132*  K 3.4* 3.6  --  4.0 4.9 4.3 3.9 4.0  CL 97* 98  --  98 99 97* 98 98  CO2 22 23  --  23 22 25 25 24   GLUCOSE 156* 162*  --  196* 139* 130* 118* 157*   BUN 17 15  --  11 13 14 17 18   CREATININE 1.60* 1.58*  --  1.34* 1.42* 1.37* 1.36* 1.28*  CALCIUM 8.2* 8.4*  --  8.1* 8.8* 8.5* 8.6* 8.8*  MG 1.6* 1.8  --   --   --   --   --   --   PHOS  --  1.4* 2.5  --   --   --   --   --     CBC: Recent Labs  Lab 02/09/23 2046 02/10/23 0507 02/11/23 0316 02/12/23 0014 02/13/23 0523 02/14/23 0248 02/15/23 0745 02/16/23 0208  WBC 10.9* 11.5* 16.4* 14.9* 11.1* 9.5 10.0 9.6  NEUTROABS 7.8* 8.8* 12.9*  --   --   --   --   --   HGB 12.4* 10.9* 10.5* 11.2* 10.8* 10.5* 11.2* 11.6*  HCT 36.5* 32.1* 30.7* 32.5* 33.0* 31.5* 34.1* 34.4*  MCV 87.3 86.5 84.3 84.6 86.6 86.5 87.0 86.2  PLT 305 209 214 229 321 345 331 362    LFT Recent Labs  Lab 02/12/23 0014 02/13/23 0523 02/14/23 0248 02/15/23 0745 02/16/23 0208  AST 10* 11* 13* 10* 10*  ALT 10 10 11 11 11   ALKPHOS 153* 147* 137* 135* 137*  BILITOT 0.8 0.4 0.6 0.5 0.5  PROT 6.3* 6.8 6.7 7.0 7.5  ALBUMIN 2.3* 2.5* 2.3* 2.4* 2.5*     Antibiotics: Anti-infectives (From admission, onward)    Start     Dose/Rate Route Frequency Ordered Stop   02/14/23 0000  linezolid (ZYVOX) 600 MG tablet        600 mg Oral 2 times daily 02/14/23 1301 03/28/23 2359   02/13/23 1500  DAPTOmycin (CUBICIN) 650 mg in sodium chloride 0.9 % IVPB        8 mg/kg  79.9 kg (Adjusted) 126 mL/hr over 30 Minutes Intravenous Daily 02/13/23 1409     02/10/23 2200  vancomycin (VANCOREADY) IVPB 1750 mg/350 mL  Status:  Discontinued        1,750 mg 175 mL/hr over 120 Minutes Intravenous Every 24 hours 02/10/23 0754 02/10/23 1355   02/10/23 2200  vancomycin (VANCOREADY) IVPB 1750 mg/350 mL  Status:  Discontinued        1,750 mg 175 mL/hr over 120 Minutes Intravenous Every 24 hours 02/10/23 1402 02/13/23 1409   02/10/23 1500  ceFAZolin (ANCEF) IVPB 2g/100 mL premix  Status:  Discontinued        2 g 200 mL/hr over 30 Minutes Intravenous Every 8 hours 02/10/23 1405 02/10/23 1413   02/10/23 1000  metroNIDAZOLE (FLAGYL) IVPB  500 mg  Status:  Discontinued        500 mg 100 mL/hr over 60 Minutes Intravenous Every 12 hours 02/10/23 0728 02/10/23 1315   02/10/23 0800  ceFEPIme (MAXIPIME) 2 g in sodium  chloride 0.9 % 100 mL IVPB  Status:  Discontinued        2 g 200 mL/hr over 30 Minutes Intravenous Every 8 hours 02/10/23 0754 02/10/23 1315   02/09/23 2130  ceFEPIme (MAXIPIME) 2 g in sodium chloride 0.9 % 100 mL IVPB        2 g 200 mL/hr over 30 Minutes Intravenous  Once 02/09/23 2121 02/09/23 2253   02/09/23 2130  metroNIDAZOLE (FLAGYL) IVPB 500 mg        500 mg 100 mL/hr over 60 Minutes Intravenous  Once 02/09/23 2121 02/10/23 0045   02/09/23 2130  vancomycin (VANCOCIN) IVPB 1000 mg/200 mL premix  Status:  Discontinued        1,000 mg 200 mL/hr over 60 Minutes Intravenous  Once 02/09/23 2121 02/09/23 2123   02/09/23 2130  vancomycin (VANCOREADY) IVPB 2000 mg/400 mL        2,000 mg 200 mL/hr over 120 Minutes Intravenous  Once 02/09/23 2123 02/10/23 0426        DVT prophylaxis: SCDs  Code Status: Full code  Family Communication: No family at bedside   CONSULTS    Subjective   Denies pain.   Objective    Physical Examination:  General-appears in no acute distress Heart-S1-S2, regular, no murmur auscultated Lungs-clear to auscultation bilaterally, no wheezing or crackles auscultated Abdomen-soft, nontender, no organomegaly Extremities-no edema in the lower extremities Neuro-alert, oriented x3, no focal deficit noted   Status is: Inpatient:          Meredeth Ide   [facility] If 7PM-7AM, please contact night-coverage at [email or web address], Office  [phone]   02/16/2023, 9:29 AM  LOS: 6 days

## 2023-02-17 DIAGNOSIS — R6520 Severe sepsis without septic shock: Secondary | ICD-10-CM | POA: Diagnosis not present

## 2023-02-17 DIAGNOSIS — Z8739 Personal history of other diseases of the musculoskeletal system and connective tissue: Secondary | ICD-10-CM | POA: Diagnosis not present

## 2023-02-17 DIAGNOSIS — A419 Sepsis, unspecified organism: Secondary | ICD-10-CM | POA: Diagnosis not present

## 2023-02-17 DIAGNOSIS — Z87898 Personal history of other specified conditions: Secondary | ICD-10-CM | POA: Diagnosis not present

## 2023-02-17 LAB — CULTURE, BLOOD (ROUTINE X 2)
Culture: NO GROWTH
Culture: NO GROWTH
Special Requests: ADEQUATE
Special Requests: ADEQUATE

## 2023-02-17 LAB — GLUCOSE, CAPILLARY
Glucose-Capillary: 103 mg/dL — ABNORMAL HIGH (ref 70–99)
Glucose-Capillary: 145 mg/dL — ABNORMAL HIGH (ref 70–99)

## 2023-02-17 MED ORDER — ENOXAPARIN SODIUM 40 MG/0.4ML IJ SOSY: 40.0000 mg | PREFILLED_SYRINGE | INTRAMUSCULAR | Status: DC

## 2023-02-17 MED ORDER — OXYCODONE HCL 5 MG PO TABS: 5.0000 mg | ORAL_TABLET | Freq: Four times a day (QID) | ORAL | 0 refills | Status: DC | PRN

## 2023-02-17 MED ORDER — ORITAVANCIN DIPHOSPHATE 400 MG IV SOLR
1200.0000 mg | Freq: Once | INTRAVENOUS | Status: AC
  Administered 2023-02-17: 1200 mg via INTRAVENOUS
  Filled 2023-02-17: qty 120

## 2023-02-17 NOTE — Discharge Summary (Signed)
Physician Discharge Summary   Patient: Ronald Hampton MRN: [id number] DOB: January 17, 1981  Admit date:     02/09/2023  Discharge date: 02/17/23  Discharge Physician: Meredeth Ide   PCP: Patient, No Pcp Per   Recommendations at discharge:   Follow-up infectious disease clinic on 03/07/2023 at 2 PM  Discharge Diagnoses: Principal Problem:   Severe sepsis [facility]) Active Problems:   History of discitis   History of bacteremia   History of osteomyelitis   Depression   History of CVA (cerebrovascular accident)   Polysubstance abuse (HCC)   Bipolar I disorder, current or most recent episode depressed, with psychotic features (HCC)   PTSD (post-traumatic stress disorder)   HTN (hypertension)   Lower extremity cellulitis  Resolved Problems:   SIRS (systemic inflammatory response syndrome) [facility])  Hospital Course:  42 y.o. male with PMH of multiple myeloma, CKD, depression, PTSD, bipolar disorder, CVA, HTN, polysubstance use/IVDU, DM-2, endocarditis, osteomyelitis and discitis presented with 3 weeks of left lower quadrant abdominal pain, left leg pain hip pain, 2 years of chronic low back pain that worsened, was having malaise, intermittent numbness in feet, ongoing IVDU methamphetamine last used 2 weeks prior to admission.   Patient is not a great historian.  He reports coming to the hospital due to difficulty using the bathroom for 3 days. He also reports associated nausea.  No other complaints until specifically asked for symptoms.  He admits to fever with a temperature of 100.8 F yesterday.  He also admits to abdominal pain over LLQ for 2 to 3 weeks.  He admits to lower back pain for 2 years without acute change.     Assessment and Plan:  Severe sepsis due to bilateral lower extremity cellulitis, POA -History of IE/osteomyelitis and discitis with question of functional paraplegia: MSSA bacteremia this admission.ID following given history of previous IVDA is concern -2D echo  7/29 with EF 65 to 70% no RWMA normal RV systolic function mitral valve is normal no regurgitation aortic valve is normal in structur  He refused CT chest, abdomen, pelvis and left leg in ED. but is agreeable for MRI once monitoring device in his leg is removed by his probation officer.  -Monitoring device was removed, underwent MRI of lumbar spine and pelvis, MRI lumbar spine was unremarkable, MRI pelvis shows pelvic fracture and fluid collection in the left abductor muscles -Orthopedic surgery consulted -Vancomycin changed to daptomycin per ID, will need 8 weeks of antibiotics -As per ID ID f/u with me on 8/22 @ 2pm Patient doesn't appear to be willing to stay for the 2 weeks at least to get iv abx -- he is not an opat candidate so plan to transition to PO abx linezolid when he leaves,  Please have linezolid po 600 mg bid for 6 weeks if he decides to leave before the 2 weeks mark, and given a dose of oritavancin today before discharge   Pelvic fracture MRI pelvis shows pelvic fracture and fluid collection in the left abductor muscles -Orthopedic surgery consulted -No plan for intervention, PT evaluation ordered -Patient will be going home with home health PT   Bilateral lower extremity weakness/recurrent fall Difficulty moving weight on the left side: W/ chronic back pain over 2 years unchanged from baseline.Has history of discitis/osteomyelitis. Continue pain control, continue PT OT.     CKD stage IIIa -Creatinine at baseline   Hyperphosphatemia -Replete   Hyponatremia -Stable, sodium has been stable around 130   Depression/bipolar disorder -Stable  Consultants: Orthopedics Procedures performed: none  Disposition: Home Diet recommendation:  Discharge Diet Orders (From admission, onward)     Start     Ordered   02/17/23 0000  Diet - low sodium heart healthy        02/17/23 1351           Regular diet DISCHARGE MEDICATION: Allergies as of 02/17/2023        Reactions   Sulfa Antibiotics Anaphylaxis   Trazodone And Nefazodone    Zofran [ondansetron Hcl] Rash        Medication List     TAKE these medications    linezolid 600 MG tablet Commonly known as: ZYVOX Take 1 tablet (600 mg total) by mouth 2 (two) times daily.   oxyCODONE 5 MG immediate release tablet Commonly known as: Roxicodone Take 1 tablet (5 mg total) by mouth every 6 (six) hours as needed.               Durable Medical Equipment  (From admission, onward)           Start     Ordered   02/15/23 1042  For home use only DME lightweight manual wheelchair with seat cushion  Once       Comments: Patient suffers from Pelvic Fractures with weight bearing as tolerated restrictions which impairs their ability to perform daily activities like bathing, dressing, grooming, and toileting in the home.  A cane, crutch, or walker will not resolve issue with performing activities of daily living. A wheelchair will allow patient to safely perform daily activities. Patient is not able to propel themselves in the home using a standard weight wheelchair due to endurance and general weakness. Patient can self propel in the lightweight wheelchair. Length of need 12 months . Accessories: elevating leg rests (ELRs), wheel locks, extensions and anti-tippers.   02/15/23 1041            Follow-up Information     [facility] [place] Follow up.   Why: Someone will call you to schedule first home visit. If you have not received a call after two days of discharging home, call their number listed. If no one comes to assess, call Case Manager at [phone]. Contact information: [address] [place] [address] [phone]         Raymondo Band, MD Follow up on 03/07/2023.   Specialty: Infectious Diseases Why: 2 pm Contact information: [address] [place] [address] [phone]                Discharge Exam: Ceasar Mons Weights    02/14/23 0500 02/15/23 0500 02/16/23 0500  Weight: 93.4 kg 93.7 kg 93.4 kg   General-appears in no acute distress Heart-S1-S2, regular, no murmur auscultated Lungs-clear to auscultation bilaterally, no wheezing or crackles auscultated Abdomen-soft, nontender, no organomegaly Extremities-no edema in the lower extremities Neuro-alert, oriented x3, no focal deficit noted  Condition at discharge: good  The results of significant diagnostics from this hospitalization (including imaging, microbiology, ancillary and laboratory) are listed below for reference.   Imaging Studies: MR PELVIS WO CONTRAST  Result Date: 02/13/2023 CLINICAL DATA:  Left hip and leg pain.  MRSA bacteremia. EXAM: MRI PELVIS WITHOUT CONTRAST TECHNIQUE: Multiplanar multisequence MR imaging of the pelvis was performed. No intravenous contrast was administered. COMPARISON:  Pelvic and right hip radiographs 06/05/2022. Pelvic CT 06/03/2022. FINDINGS: Urinary Tract: The visualized distal ureters are not dilated. The urinary bladder is mildly distended  without wall thickening or surrounding inflammation. Bowel: No bowel wall thickening, distention or surrounding inflammation identified within the pelvis. Mildly prominent stool in the rectum. Vascular/Lymphatic: There are small left common iliac and bilateral external iliac lymph nodes bilaterally which are nonspecific, although likely reactive. No evidence of aneurysm or large vessel occlusion. Reproductive: The prostate gland and seminal vesicles appear unremarkable. Other: No evidence of pelvic ascites, focal extraluminal fluid collection or significant inflammation in the pelvis. Musculoskeletal: Lumbar spine findings are dictated separately. There are new nondisplaced bilateral sacral insufficiency fractures, most obvious on the coronal images. There is surrounding sacral bone marrow edema. No evidence of sacroiliac widening, erosive disease or effusion. In addition, there are  nondisplaced fractures of the superior pubic rami bilaterally. The left inferior pubic ramus demonstrates a mildly displaced fracture with associated marrow edema and fluid collections within the left adductor musculature measuring up to 4.5 cm on coronal image 27/2. There is surrounding adductor and gluteus muscular edema. No evidence of proximal femur fracture or significant hip joint effusion. IMPRESSION: 1. Multiple acute/subacute pelvic fractures as described. There are nondisplaced fractures of the sacrum and superior pubic rami bilaterally with associated marrow edema. 2. Mildly displaced fracture of the left inferior pubic ramus with associated marrow edema, muscular edema and fluid collections within the left adductor musculature, likely hematomas. These fluid collections could be secondarily infected, although there is no specific evidence for that. 3. No evidence of septic arthritis or osteomyelitis. 4. Lumbar spine findings are dictated separately. Electronically Signed   By: Carey Bullocks M.D.   On: 02/13/2023 18:49   MR LUMBAR SPINE WO CONTRAST  Result Date: 02/13/2023 CLINICAL DATA:  Low back pain.  MRSA bacteremia. EXAM: MRI LUMBAR SPINE WITHOUT CONTRAST TECHNIQUE: Multiplanar, multisequence MR imaging of the lumbar spine was performed. No intravenous contrast was administered. COMPARISON:  MRI 11/21/2020 FINDINGS: Segmentation: 5 lumbar type vertebral bodies as numbered previously. Alignment: Kyphotic curvature at the thoracolumbar junction region. Chronic collapse/fusion of the L1 and L2 vertebral bodies responsible for this. Chronic bilateral pars defects at L5 with chronic spondylolisthesis of 7-8 mm. Vertebrae: Chronic collapse and fusion at the L1-2 level as noted above. Chronic endplate deformity at L3 without edema. Mild degenerative type endplate signal at T11-12, T12-L1, L3-4 and L4-5. Findings are likely degenerative. Unable to implicate infection at those levels based on these  findings. Bilateral sacroiliac edema. See results of pelvic MRI. Conus medullaris and cauda equina: Conus extends to the T12-L1 level. Conus and cauda equina appear normal. Paraspinal and other soft tissues: Markedly dilated bladder. Disc levels: Disc bulges at T11-12 and T12-L1. No L2-3: Mild bulging of the disc. No compressive stenosis. L3-4: Mild bulging of the disc.  No compressive stenosis. L4-5: Mild bulging of the disc.  No compressive stenosis. L5-S1: Chronic bilateral pars defects, with anterolisthesis of 7-8 mm. Disc degeneration with loss of disc height. No compressive canal stenosis. Bilateral foraminal narrowing but without definite compression of the exiting L5 nerves. IMPRESSION: 1. No specific evidence of infection in the lumbar spine. 2. Chronic collapse/fusion of the L1 and L2 vertebral bodies with kyphotic curvature at the thoracolumbar junction region. 3. 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 Disc degeneration with loss of disc height. Bilateral foraminal narrowing but without definite compression of the exiting L5 nerves. 4. Mild degenerative type endplate signal at T11-12, T12-L1, L3-4 and L4-5. Findings are likely degenerative. Unable to implicate infection at those levels based on these findings. 5. Bilateral sacroiliac  edema. See results of pelvic MRI. 6. Markedly dilated bladder. Electronically Signed   By: Paulina Fusi M.D.   On: 02/13/2023 18:01   ECHOCARDIOGRAM COMPLETE  Result Date: 02/11/2023    ECHOCARDIOGRAM REPORT   Patient Name:   Curlee Bogan [id number] Hampton Date of Exam: 02/11/2023 Medical Rec #:  [id number]                 Height:       69.0 in Accession #:    [id number]                Weight:       202.8 lb Date of Birth:  1981/02/16                 BSA:          2.078 m Patient Age:    41 years                  BP:           132/72 mmHg Patient Gender: M                         HR:           98 bpm. Exam Location:  Inpatient Procedure: 2D Echo,  Cardiac Doppler and Color Doppler Indications:    Bacteremia R78.81  History:        Patient has no prior history of Echocardiogram examinations.                 Stroke; Risk Factors:Hypertension and Diabetes. CKD.  Sonographer:    Lucendia Herrlich Referring Phys: [id number] TAYE T GONFA IMPRESSIONS  1. Left ventricular ejection fraction, by estimation, is 65 to 70%. The left ventricle has hyperdynamic function. The left ventricle has no regional wall motion abnormalities. Left ventricular diastolic parameters were normal.  2. Right ventricular systolic function is normal. The right ventricular size is normal.  3. The mitral valve is normal in structure. No evidence of mitral valve regurgitation. No evidence of mitral stenosis.  4. The aortic valve is normal in structure. Aortic valve regurgitation is not visualized. No aortic stenosis is present.  5. The inferior vena cava is normal in size with greater than 50% respiratory variability, suggesting right atrial pressure of 3 mmHg. FINDINGS  Left Ventricle: Left ventricular ejection fraction, by estimation, is 65 to 70%. The left ventricle has hyperdynamic function. The left ventricle has no regional wall motion abnormalities. The left ventricular internal cavity size was normal in size. There is no left ventricular hypertrophy. Left ventricular diastolic parameters were normal. Right Ventricle: The right ventricular size is normal. No increase in right ventricular wall thickness. Right ventricular systolic function is normal. Left Atrium: Left atrial size was normal in size. Right Atrium: Right atrial size was normal in size. Pericardium: There is no evidence of pericardial effusion. Mitral Valve: The mitral valve is normal in structure. No evidence of mitral valve regurgitation. No evidence of mitral valve stenosis. Tricuspid Valve: The tricuspid valve is not well visualized. Tricuspid valve regurgitation is trivial. No evidence of tricuspid stenosis. Aortic Valve:  The aortic valve is normal in structure. Aortic valve regurgitation is not visualized. No aortic stenosis is present. Aortic valve peak gradient measures 6.7 mmHg. Pulmonic Valve: The pulmonic valve was not well visualized. Pulmonic valve regurgitation is trivial. No evidence of pulmonic stenosis. Aorta: The aortic root is normal in size and structure. Venous: The  inferior vena cava is normal in size with greater than 50% respiratory variability, suggesting right atrial pressure of 3 mmHg. IAS/Shunts: No atrial level shunt detected by color flow Doppler.  LEFT VENTRICLE PLAX 2D LVIDd:         4.60 cm   Diastology LVIDs:         2.80 cm   LV e' medial:    9.64 cm/s LV PW:         1.10 cm   LV E/e' medial:  7.9 LV IVS:        1.00 cm   LV e' lateral:   10.30 cm/s LVOT diam:     2.40 cm   LV E/e' lateral: 7.4 LV SV:         65 LV SV Index:   31 LVOT Area:     4.52 cm  RIGHT VENTRICLE RV S prime:     21.40 cm/s TAPSE (M-mode): 2.0 cm LEFT ATRIUM             Index        RIGHT ATRIUM          Index LA diam:        4.30 cm 2.07 cm/m   RA Area:     9.69 cm LA Vol (A2C):   43.1 ml 20.74 ml/m  RA Volume:   15.80 ml 7.60 ml/m LA Vol (A4C):   49.1 ml 23.62 ml/m LA Biplane Vol: 47.3 ml 22.76 ml/m  AORTIC VALVE AV Area (Vmax): 3.44 cm AV Vmax:        129.00 cm/s AV Peak Grad:   6.7 mmHg LVOT Vmax:      98.20 cm/s LVOT Vmean:     63.300 cm/s LVOT VTI:       0.144 m  AORTA Ao Root diam: 3.50 cm Ao Asc diam:  3.40 cm MITRAL VALVE               TRICUSPID VALVE MV Area (PHT): 3.89 cm    TR Peak grad:   13.4 mmHg MV Decel Time: 195 msec    TR Vmax:        183.00 cm/s MV E velocity: 76.50 cm/s MV A velocity: 86.10 cm/s  SHUNTS MV E/A ratio:  0.89        Systemic VTI:  0.14 m                            Systemic Diam: 2.40 cm Aditya Sabharwal Electronically signed by Dorthula Nettles Signature Date/Time: 02/11/2023/5:14:33 PM    Final    DG Chest 2 View  Result Date: 02/09/2023 CLINICAL DATA:  Suspected sepsis. Diagnosis of  multiple myeloma last year. No current treatments. EXAM: CHEST - 2 VIEW COMPARISON:  CT chest, abdomen and pelvis 11/21/2020, portable chest 06/12/2022. FINDINGS: There is a tangle of overlying monitor wires. The cardiomediastinal silhouette and vascular pattern are normal. Medially in the right lower lung field there are clustered rounded opacities measuring up 2 cm, with several visible. On the lateral view this appears to be from increased eventration and elevation of the anterior right hemidiaphragm with anterior colonic interposition, and these are probably balls of stool. Remainder of the lungs are clear. There is no substantial pleural effusion. The cardiomediastinal silhouette and vasculature are normal. Chest CT is recommended to confirm a benign process. In all other respects there are no further changes. There is generalized osteopenia. IMPRESSION: 1. Clustered rounded  opacities in the medial right lower lung field are probably balls of stool within the interposed hepatic flexure. Chest CT is recommended to confirm a benign process. 2. No evidence of acute chest process. 3. Osteopenia. Electronically Signed   By: Almira Bar M.D.   On: 02/09/2023 22:04    Microbiology: Results for orders placed or performed during the hospital encounter of 02/09/23  Culture, blood (Routine x 2)     Status: Abnormal   Collection Time: 02/09/23  8:46 PM   Specimen: BLOOD  Result Value Ref Range Status   Specimen Description BLOOD LEFT ARM  Final   Special Requests   Final    BOTTLES DRAWN AEROBIC AND ANAEROBIC Blood Culture adequate volume   Culture  Setup Time   Final    GRAM POSITIVE COCCI IN BOTH AEROBIC AND ANAEROBIC BOTTLES    Culture (A)  Final    STAPHYLOCOCCUS AUREUS SUSCEPTIBILITIES PERFORMED ON PREVIOUS CULTURE WITHIN THE LAST 5 DAYS. Performed at [facility], [address] [place], [place] [address]    Report Status 02/12/2023 FINAL  Final  Culture, blood (Routine x 2)     Status:  Abnormal (Preliminary result)   Collection Time: 02/09/23  9:08 PM   Specimen: BLOOD  Result Value Ref Range Status   Specimen Description BLOOD LEFT ARM  Final   Special Requests   Final    BOTTLES DRAWN AEROBIC AND ANAEROBIC Blood Culture adequate volume   Culture  Setup Time   Final    GRAM POSITIVE COCCI IN BOTH AEROBIC AND ANAEROBIC BOTTLES CRITICAL RESULT CALLED TO, READ BACK BY AND VERIFIED WITH: PHARMD HALEY VON DOHLEN [phone] [id number] BY J RAZZAK, MT    Culture (A)  Final    METHICILLIN RESISTANT STAPHYLOCOCCUS AUREUS Sent to [facility] for further susceptibility testing. Performed at [facility], [address], [place], [place] [address]    Report Status PENDING  Incomplete   Organism ID, Bacteria METHICILLIN RESISTANT STAPHYLOCOCCUS AUREUS  Final      Susceptibility   Methicillin resistant staphylococcus aureus - MIC*    CIPROFLOXACIN >=8 RESISTANT Resistant     ERYTHROMYCIN >=8 RESISTANT Resistant     GENTAMICIN <=0.5 SENSITIVE Sensitive     OXACILLIN >=4 RESISTANT Resistant     TETRACYCLINE <=1 SENSITIVE Sensitive     VANCOMYCIN 1 SENSITIVE Sensitive     TRIMETH/SULFA >=320 RESISTANT Resistant     CLINDAMYCIN <=0.25 SENSITIVE Sensitive     RIFAMPIN <=0.5 SENSITIVE Sensitive     Inducible Clindamycin NEGATIVE Sensitive     LINEZOLID 2 SENSITIVE Sensitive     * METHICILLIN RESISTANT STAPHYLOCOCCUS AUREUS  Blood Culture ID Panel (Reflexed)     Status: Abnormal   Collection Time: 02/09/23  9:08 PM  Result Value Ref Range Status   Enterococcus faecalis NOT DETECTED NOT DETECTED Final   Enterococcus Faecium NOT DETECTED NOT DETECTED Final   Listeria monocytogenes NOT DETECTED NOT DETECTED Final   Staphylococcus species DETECTED (A) NOT DETECTED Final    Comment: CRITICAL RESULT CALLED TO, READ BACK BY AND VERIFIED WITH: PHARMD HALEY VON DOHLEN [phone] [id number] BY J RAZZAK, MT    Staphylococcus aureus (BCID) DETECTED (A) NOT DETECTED Final    Comment: Methicillin  (oxacillin)-resistant Staphylococcus aureus (MRSA). MRSA is predictably resistant to beta-lactam antibiotics (except ceftaroline). Preferred therapy is vancomycin unless clinically contraindicated. Patient requires contact precautions if  hospitalized. CRITICAL RESULT CALLED TO, READ BACK BY AND VERIFIED WITH: PHARMD HALEY VON DOHLEN [phone] [id number]  BY J RAZZAK, MT    Staphylococcus epidermidis NOT DETECTED NOT DETECTED Final   Staphylococcus lugdunensis NOT DETECTED NOT DETECTED Final   Streptococcus species NOT DETECTED NOT DETECTED Final   Streptococcus agalactiae NOT DETECTED NOT DETECTED Final   Streptococcus pneumoniae NOT DETECTED NOT DETECTED Final   Streptococcus pyogenes NOT DETECTED NOT DETECTED Final   A.calcoaceticus-baumannii NOT DETECTED NOT DETECTED Final   Bacteroides fragilis NOT DETECTED NOT DETECTED Final   Enterobacterales NOT DETECTED NOT DETECTED Final   Enterobacter cloacae complex NOT DETECTED NOT DETECTED Final   Escherichia coli NOT DETECTED NOT DETECTED Final   Klebsiella aerogenes NOT DETECTED NOT DETECTED Final   Klebsiella oxytoca NOT DETECTED NOT DETECTED Final   Klebsiella pneumoniae NOT DETECTED NOT DETECTED Final   Proteus species NOT DETECTED NOT DETECTED Final   Salmonella species NOT DETECTED NOT DETECTED Final   Serratia marcescens NOT DETECTED NOT DETECTED Final   Haemophilus influenzae NOT DETECTED NOT DETECTED Final   Neisseria meningitidis NOT DETECTED NOT DETECTED Final   Pseudomonas aeruginosa NOT DETECTED NOT DETECTED Final   Stenotrophomonas maltophilia NOT DETECTED NOT DETECTED Final   Candida albicans NOT DETECTED NOT DETECTED Final   Candida auris NOT DETECTED NOT DETECTED Final   Candida glabrata NOT DETECTED NOT DETECTED Final   Candida krusei NOT DETECTED NOT DETECTED Final   Candida parapsilosis NOT DETECTED NOT DETECTED Final   Candida tropicalis NOT DETECTED NOT DETECTED Final   Cryptococcus neoformans/gattii NOT DETECTED NOT  DETECTED Final   Meth resistant mecA/C and MREJ DETECTED (A) NOT DETECTED Final    Comment: CRITICAL RESULT CALLED TO, READ BACK BY AND VERIFIED WITH: PHARMD HALEY VON DOHLEN [phone] [id number] BY Berline Chough, MT Performed at [facility], [address], [place], [place] [address]   Resp panel by RT-PCR (RSV, Flu A&B, Covid) Anterior Nasal Swab     Status: None   Collection Time: 02/09/23 11:21 PM   Specimen: Anterior Nasal Swab  Result Value Ref Range Status   SARS Coronavirus 2 by RT PCR NEGATIVE NEGATIVE Final   Influenza A by PCR NEGATIVE NEGATIVE Final   Influenza B by PCR NEGATIVE NEGATIVE Final    Comment: (NOTE) The Xpert Xpress SARS-CoV-2/FLU/RSV plus assay is intended as an aid in the diagnosis of influenza from Nasopharyngeal swab specimens and should not be used as a sole basis for treatment. Nasal washings and aspirates are unacceptable for Xpert Xpress SARS-CoV-2/FLU/RSV testing.  Fact Sheet for Patients: BloggerCourse.com  Fact Sheet for Healthcare Providers: SeriousBroker.it  This test is not yet approved or cleared by the [place] FDA and has been authorized for detection and/or diagnosis of SARS-CoV-2 by FDA under an Emergency Use Authorization (EUA). This EUA will remain in effect (meaning this test can be used) for the duration of the COVID-19 declaration under Section 564(b)(1) of the Act, 21 U.S.C. section 360bbb-3(b)(1), unless the authorization is terminated or revoked.     Resp Syncytial Virus by PCR NEGATIVE NEGATIVE Final    Comment: (NOTE) Fact Sheet for Patients: BloggerCourse.com  Fact Sheet for Healthcare Providers: SeriousBroker.it  This test is not yet approved or cleared by the [place] FDA and has been authorized for detection and/or diagnosis of SARS-CoV-2 by FDA under an Emergency Use Authorization (EUA). This EUA will remain in  effect (meaning this test can be used) for the duration of the COVID-19 declaration under Section 564(b)(1) of the Act, 21 U.S.C. section 360bbb-3(b)(1), unless the authorization is terminated or revoked.  Performed at [facility], [address] [place], [place] [address]   Culture, blood (Routine X 2) w Reflex to ID Panel     Status: None   Collection Time: 02/12/23 10:38 AM   Specimen: BLOOD  Result Value Ref Range Status   Specimen Description BLOOD BLOOD LEFT ARM  Final   Special Requests   Final    BOTTLES DRAWN AEROBIC AND ANAEROBIC Blood Culture adequate volume   Culture   Final    NO GROWTH 5 DAYS Performed at [facility], [address] [place], [place] [address]    Report Status 02/17/2023 FINAL  Final  Culture, blood (Routine X 2) w Reflex to ID Panel     Status: None   Collection Time: 02/12/23 10:38 AM   Specimen: BLOOD  Result Value Ref Range Status   Specimen Description BLOOD BLOOD RIGHT ARM  Final   Special Requests   Final    BOTTLES DRAWN AEROBIC AND ANAEROBIC Blood Culture adequate volume   Culture   Final    NO GROWTH 5 DAYS Performed at [facility], [address], [place], [place] [address]    Report Status 02/17/2023 FINAL  Final    Labs: CBC: Recent Labs  Lab 02/11/23 0316 02/12/23 0014 02/13/23 0523 02/14/23 0248 02/15/23 0745 02/16/23 0208  WBC 16.4* 14.9* 11.1* 9.5 10.0 9.6  NEUTROABS 12.9*  --   --   --   --   --   HGB 10.5* 11.2* 10.8* 10.5* 11.2* 11.6*  HCT 30.7* 32.5* 33.0* 31.5* 34.1* 34.4*  MCV 84.3 84.6 86.6 86.5 87.0 86.2  PLT 214 229 321 345 331 362   Basic Metabolic Panel: Recent Labs  Lab 02/11/23 0316 02/11/23 1916 02/12/23 0014 02/13/23 0523 02/14/23 0248 02/15/23 0745 02/16/23 0208  NA 128*  --  129* 132* 130* 131* 132*  K 3.6  --  4.0 4.9 4.3 3.9 4.0  CL 98  --  98 99 97* 98 98  CO2 23  --  23 22 25 25 24   GLUCOSE 162*  --  196* 139* 130* 118* 157*  BUN 15  --  11 13 14 17 18    CREATININE 1.58*  --  1.34* 1.42* 1.37* 1.36* 1.28*  CALCIUM 8.4*  --  8.1* 8.8* 8.5* 8.6* 8.8*  MG 1.8  --   --   --   --   --   --   PHOS 1.4* 2.5  --   --   --   --   --    Liver Function Tests: Recent Labs  Lab 02/12/23 0014 02/13/23 0523 02/14/23 0248 02/15/23 0745 02/16/23 0208  AST 10* 11* 13* 10* 10*  ALT 10 10 11 11 11   ALKPHOS 153* 147* 137* 135* 137*  BILITOT 0.8 0.4 0.6 0.5 0.5  PROT 6.3* 6.8 6.7 7.0 7.5  ALBUMIN 2.3* 2.5* 2.3* 2.4* 2.5*   CBG: Recent Labs  Lab 02/16/23 1121 02/16/23 1649 02/16/23 2126 02/17/23 0727 02/17/23 1119  GLUCAP 127* 145* 138* 103* 145*    Discharge time spent: greater than 30 minutes.  Signed: Meredeth Ide, MD [facility] 02/17/2023

## 2023-02-17 NOTE — Progress Notes (Incomplete)
[facility]  PROGRESS NOTE  Ronald Hampton ZOX:[id number] DOB: [birth year] DOA: 02/09/2023 PCP: Patient, No Pcp Per   Brief HPI:   42 y.o. male with PMH of multiple myeloma, CKD, depression, PTSD, bipolar disorder, CVA, HTN, polysubstance use/IVDU, DM-2, endocarditis, osteomyelitis and discitis presented with 3 weeks of left lower quadrant abdominal pain, left leg pain hip pain, 2 years of chronic low back pain that worsened, was having malaise, intermittent numbness in feet, ongoing IVDU methamphetamine last used 2 weeks prior to admission.   Patient is not a great historian.  He reports coming to the hospital due to difficulty using the bathroom for 3 days. He also reports associated nausea.  No other complaints until specifically asked for symptoms.  He admits to fever with a temperature of 100.8 F yesterday.  He also admits to abdominal pain over LLQ for 2 to 3 weeks.  He admits to lower back pain for 2 years without acute change.     Assessment/Plan:   Severe sepsis due to bilateral lower extremity cellulitis, POA -History of IE/osteomyelitis and discitis with question of functional paraplegia: MSSA bacteremia this admission.ID following given history of previous IVDA is concern -2D echo 7/29 with EF 65 to 70% no RWMA normal RV systolic function mitral valve is normal no regurgitation aortic valve is normal in structur  He refused CT chest, abdomen, pelvis and left leg in ED. but is agreeable for MRI once monitoring device in his leg is removed by his probation officer.  -Monitoring device was removed, underwent MRI of lumbar spine and pelvis, MRI lumbar spine was unremarkable, MRI pelvis shows pelvic fracture and fluid collection in the left abductor muscles -Orthopedic surgery consulted -Vancomycin changed to daptomycin per ID, will need 8 weeks of antibiotics -As per ID ID f/u with me on 8/22 @ 2pm Patient doesn't appear to be willing to stay for the 2 weeks at least to  get iv abx -- he is not an opat candidate so plan to transition to PO abx linezolid when he leaves, but recommend to get at least 10-14 days of iv abx Please have linezolid po 600 mg bid for 6 weeks if he decides to leave before the 2 weeks mark, and give a dose of oritavancin  Pelvic fracture MRI pelvis shows pelvic fracture and fluid collection in the left abductor muscles -Orthopedic surgery consulted -No plan for intervention, PT evaluation ordered  Bilateral lower extremity weakness/recurrent fall Difficulty moving weight on the left side: W/ chronic back pain over 2 years unchanged from baseline.Has history of discitis/osteomyelitis. Continue pain control, continue PT OT.   CKD stage IIIa -Creatinine at baseline  Hyperphosphatemia -Replete  Hyponatremia -Stable, sodium has been stable around 130  Depression/bipolar disorder -Stable  Medications     enoxaparin (LOVENOX) injection  40 mg Subcutaneous Q24H   insulin aspart  0-15 Units Subcutaneous TID WC   insulin aspart  0-5 Units Subcutaneous QHS   polyethylene glycol  17 g Oral BID   senna-docusate  2 tablet Oral BID     Data Reviewed:   CBG:  Recent Labs  Lab 02/16/23 0739 02/16/23 1121 02/16/23 1649 02/16/23 2126 02/17/23 0727  GLUCAP 125* 127* 145* 138* 103*    SpO2: 97 % O2 Flow Rate (L/min): 98.9 L/min    Vitals:   02/16/23 1648 02/16/23 2127 02/17/23 0548 02/17/23 0856  BP: 103/84 (!) 126/97 (!) 133/90 (!) 108/96  Pulse: 87 94 98 100  Resp: 18 18 18  19  Temp: 98.2 F (36.8 C) 98 F (36.7 C) 98.9 F (37.2 C) 98.4 F (36.9 C)  TempSrc: Oral   Oral  SpO2: 98% 98% 99% 97%  Weight:      Height:          Data Reviewed:  Basic Metabolic Panel: Recent Labs  Lab 02/11/23 0316 02/11/23 1916 02/12/23 0014 02/13/23 0523 02/14/23 0248 02/15/23 0745 02/16/23 0208  NA 128*  --  129* 132* 130* 131* 132*  K 3.6  --  4.0 4.9 4.3 3.9 4.0  CL 98  --  98 99 97* 98 98  CO2 23  --  23 22 25  25 24   GLUCOSE 162*  --  196* 139* 130* 118* 157*  BUN 15  --  11 13 14 17 18   CREATININE 1.58*  --  1.34* 1.42* 1.37* 1.36* 1.28*  CALCIUM 8.4*  --  8.1* 8.8* 8.5* 8.6* 8.8*  MG 1.8  --   --   --   --   --   --   PHOS 1.4* 2.5  --   --   --   --   --     CBC: Recent Labs  Lab 02/11/23 0316 02/12/23 0014 02/13/23 0523 02/14/23 0248 02/15/23 0745 02/16/23 0208  WBC 16.4* 14.9* 11.1* 9.5 10.0 9.6  NEUTROABS 12.9*  --   --   --   --   --   HGB 10.5* 11.2* 10.8* 10.5* 11.2* 11.6*  HCT 30.7* 32.5* 33.0* 31.5* 34.1* 34.4*  MCV 84.3 84.6 86.6 86.5 87.0 86.2  PLT 214 229 321 345 331 362    LFT Recent Labs  Lab 02/12/23 0014 02/13/23 0523 02/14/23 0248 02/15/23 0745 02/16/23 0208  AST 10* 11* 13* 10* 10*  ALT 10 10 11 11 11   ALKPHOS 153* 147* 137* 135* 137*  BILITOT 0.8 0.4 0.6 0.5 0.5  PROT 6.3* 6.8 6.7 7.0 7.5  ALBUMIN 2.3* 2.5* 2.3* 2.4* 2.5*     Antibiotics: Anti-infectives (From admission, onward)    Start     Dose/Rate Route Frequency Ordered Stop   02/14/23 0000  linezolid (ZYVOX) 600 MG tablet        600 mg Oral 2 times daily 02/14/23 1301 03/28/23 2359   02/13/23 1500  DAPTOmycin (CUBICIN) 650 mg in sodium chloride 0.9 % IVPB        8 mg/kg  79.9 kg (Adjusted) 126 mL/hr over 30 Minutes Intravenous Daily 02/13/23 1409     02/10/23 2200  vancomycin (VANCOREADY) IVPB 1750 mg/350 mL  Status:  Discontinued        1,750 mg 175 mL/hr over 120 Minutes Intravenous Every 24 hours 02/10/23 0754 02/10/23 1355   02/10/23 2200  vancomycin (VANCOREADY) IVPB 1750 mg/350 mL  Status:  Discontinued        1,750 mg 175 mL/hr over 120 Minutes Intravenous Every 24 hours 02/10/23 1402 02/13/23 1409   02/10/23 1500  ceFAZolin (ANCEF) IVPB 2g/100 mL premix  Status:  Discontinued        2 g 200 mL/hr over 30 Minutes Intravenous Every 8 hours 02/10/23 1405 02/10/23 1413   02/10/23 1000  metroNIDAZOLE (FLAGYL) IVPB 500 mg  Status:  Discontinued        500 mg 100 mL/hr over 60  Minutes Intravenous Every 12 hours 02/10/23 0728 02/10/23 1315   02/10/23 0800  ceFEPIme (MAXIPIME) 2 g in sodium chloride 0.9 % 100 mL IVPB  Status:  Discontinued  2 g 200 mL/hr over 30 Minutes Intravenous Every 8 hours 02/10/23 0754 02/10/23 1315   02/09/23 2130  ceFEPIme (MAXIPIME) 2 g in sodium chloride 0.9 % 100 mL IVPB        2 g 200 mL/hr over 30 Minutes Intravenous  Once 02/09/23 2121 02/09/23 2253   02/09/23 2130  metroNIDAZOLE (FLAGYL) IVPB 500 mg        500 mg 100 mL/hr over 60 Minutes Intravenous  Once 02/09/23 2121 02/10/23 0045   02/09/23 2130  vancomycin (VANCOCIN) IVPB 1000 mg/200 mL premix  Status:  Discontinued        1,000 mg 200 mL/hr over 60 Minutes Intravenous  Once 02/09/23 2121 02/09/23 2123   02/09/23 2130  vancomycin (VANCOREADY) IVPB 2000 mg/400 mL        2,000 mg 200 mL/hr over 120 Minutes Intravenous  Once 02/09/23 2123 02/10/23 0426        DVT prophylaxis: SCDs  Code Status: Full code  Family Communication: No family at bedside   CONSULTS    Subjective      Objective    Physical Examination:     Status is: Inpatient:          Meredeth Ide   [facility] If 7PM-7AM, please contact night-coverage at [email or web address], Office  [phone]   02/17/2023, 9:24 AM  LOS: 7 days

## 2023-02-18 LAB — MISC LABCORP TEST (SEND OUT)
LabCorp test name: 1
Labcorp test code: 96388

## 2023-02-18 LAB — CULTURE, BLOOD (ROUTINE X 2): Special Requests: ADEQUATE

## 2023-02-27 ENCOUNTER — Emergency Department (HOSPITAL_COMMUNITY): Payer: No Typology Code available for payment source

## 2023-02-27 ENCOUNTER — Emergency Department (HOSPITAL_COMMUNITY)
Admission: EM | Admit: 2023-02-27 | Discharge: 2023-02-27 | Disposition: A | Discharge status: Home / Self Care | Payer: No Typology Code available for payment source

## 2023-02-27 ENCOUNTER — Encounter (HOSPITAL_COMMUNITY): Payer: Self-pay

## 2023-02-27 ENCOUNTER — Other Ambulatory Visit: Payer: Self-pay

## 2023-02-27 ENCOUNTER — Emergency Department (HOSPITAL_BASED_OUTPATIENT_CLINIC_OR_DEPARTMENT_OTHER)
Admit: 2023-02-27 | Discharge: 2023-02-27 | Disposition: A | Discharge status: Home / Self Care | Payer: No Typology Code available for payment source

## 2023-02-27 DIAGNOSIS — I129 Hypertensive chronic kidney disease with stage 1 through stage 4 chronic kidney disease, or unspecified chronic kidney disease: Secondary | ICD-10-CM | POA: Insufficient documentation

## 2023-02-27 DIAGNOSIS — R600 Localized edema: Secondary | ICD-10-CM | POA: Diagnosis present

## 2023-02-27 DIAGNOSIS — M7989 Other specified soft tissue disorders: Secondary | ICD-10-CM

## 2023-02-27 DIAGNOSIS — N189 Chronic kidney disease, unspecified: Secondary | ICD-10-CM | POA: Diagnosis not present

## 2023-02-27 DIAGNOSIS — E1122 Type 2 diabetes mellitus with diabetic chronic kidney disease: Secondary | ICD-10-CM | POA: Insufficient documentation

## 2023-02-27 DIAGNOSIS — Z79899 Other long term (current) drug therapy: Secondary | ICD-10-CM | POA: Insufficient documentation

## 2023-02-27 HISTORY — DX: Endocarditis, valve unspecified: I38

## 2023-02-27 HISTORY — DX: Multiple myeloma not having achieved remission: C90.00

## 2023-02-27 HISTORY — DX: Cerebral infarction, unspecified: I63.9

## 2023-02-27 HISTORY — DX: Disorder of kidney and ureter, unspecified: N28.9

## 2023-02-27 LAB — CBC
HCT: 30.9 % — ABNORMAL LOW (ref 39.0–52.0)
Hemoglobin: 10.2 g/dL — ABNORMAL LOW (ref 13.0–17.0)
MCH: 29 pg (ref 26.0–34.0)
MCHC: 33 g/dL (ref 30.0–36.0)
MCV: 87.8 fL (ref 80.0–100.0)
Platelets: 360 10*3/uL (ref 150–400)
RBC: 3.52 MIL/uL — ABNORMAL LOW (ref 4.22–5.81)
RDW: 13.2 % (ref 11.5–15.5)
WBC: 7.3 10*3/uL (ref 4.0–10.5)
nRBC: 0 % (ref 0.0–0.2)

## 2023-02-27 LAB — BASIC METABOLIC PANEL
Anion gap: 10 (ref 5–15)
BUN: 21 mg/dL — ABNORMAL HIGH (ref 6–20)
CO2: 25 mmol/L (ref 22–32)
Calcium: 9.1 mg/dL (ref 8.9–10.3)
Chloride: 100 mmol/L (ref 98–111)
Creatinine, Ser: 1.72 mg/dL — ABNORMAL HIGH (ref 0.61–1.24)
GFR, Estimated: 51 mL/min — ABNORMAL LOW (ref >60)
Glucose, Bld: 118 mg/dL — ABNORMAL HIGH (ref 70–99)
Potassium: 4.1 mmol/L (ref 3.5–5.1)
Sodium: 135 mmol/L (ref 135–145)

## 2023-02-27 LAB — TROPONIN I (HIGH SENSITIVITY): Troponin I (High Sensitivity): 4 ng/L (ref <18)

## 2023-02-27 LAB — I-STAT CG4 LACTIC ACID, ED: Lactic Acid, Venous: 1.8 mmol/L (ref 0.5–1.9)

## 2023-02-27 MED ORDER — OXYCODONE-ACETAMINOPHEN 5-325 MG PO TABS
1.0000 | ORAL_TABLET | Freq: Once | ORAL | Status: AC
  Administered 2023-02-27: 1 via ORAL
  Filled 2023-02-27: qty 1

## 2023-02-27 MED ORDER — FUROSEMIDE 20 MG PO TABS: 20.0000 mg | ORAL_TABLET | Freq: Every day | ORAL | 0 refills | Status: DC

## 2023-02-27 MED ORDER — FUROSEMIDE 20 MG PO TABS
20.0000 mg | ORAL_TABLET | Freq: Once | ORAL | Status: AC
  Administered 2023-02-27: 20 mg via ORAL
  Filled 2023-02-27: qty 1

## 2023-02-27 NOTE — Discharge Instructions (Signed)
As discussed please keep your legs elevated, consider purchasing compression stockings to help with your edema.  We are prescribing you some water pills.  Please return immediately develop fevers, chills, inability to feel your legs or walk, or if they become cold, pale or any new or worsening symptoms that are concerning to you.

## 2023-02-27 NOTE — Progress Notes (Signed)
Bilateral lower extremity venous study completed.   Preliminary results relayed to MD in ER.  Please see CV Procedures for preliminary results.   , RVT  4:08 PM 02/27/23

## 2023-02-27 NOTE — ED Provider Notes (Signed)
[place] EMERGENCY DEPARTMENT AT [facility] Provider Note   CSN: [id number] Arrival date & time: 02/27/23  1320     History  No chief complaint on file.   Ronald Hampton is a 42 y.o. male.  42 year old male present emergency department with chief complaint of bilateral lower extremity edema and pain.  Reports he has had swelling since hospitalization for which she was treated for cellulitis and is currently taking linezolid.  He reports they seem to be getting worse.  Triage note mentions that patient has shortness of breath, he states that it is secondary to pain rather than true dyspnea.  Not having any chest pain.  Denies any fevers, chills, no abdominal pain, no nausea no vomiting no difficulty urinating.        Home Medications Prior to Admission medications   Medication Sig Start Date End Date Taking? Authorizing Provider  furosemide (LASIX) 20 MG tablet Take 1 tablet (20 mg total) by mouth daily for 2 days. 02/28/23 03/02/23 Yes , Harmon Dun, DO  linezolid (ZYVOX) 600 MG tablet Take 1 tablet (600 mg total) by mouth 2 (two) times daily. 02/14/23 03/28/23  Vu, Gershon Mussel T, MD  oxyCODONE (ROXICODONE) 5 MG immediate release tablet Take 1 tablet (5 mg total) by mouth every 6 (six) hours as needed. 02/17/23 02/17/24  Meredeth Ide, MD      Allergies    Sulfa antibiotics, Trazodone and nefazodone, and Zofran [ondansetron hcl]    Review of Systems   Review of Systems  Physical Exam Updated Vital Signs BP (!) 149/93   Pulse (!) 104   Temp 98.4 F (36.9 C)   Resp 16   Ht 5\' 10"  (1.778 m)   Wt 99.8 kg   SpO2 100%   BMI 31.57 kg/m  Physical Exam Vitals and nursing note reviewed.  Constitutional:      General: He is not in acute distress.    Appearance: He is obese. He is not toxic-appearing.  HENT:     Head: Normocephalic.     Nose: Nose normal.     Mouth/Throat:     Mouth: Mucous membranes are moist.  Eyes:     Conjunctiva/sclera: Conjunctivae  normal.  Cardiovascular:     Rate and Rhythm: Normal rate and regular rhythm.  Pulmonary:     Effort: Pulmonary effort is normal.     Breath sounds: Normal breath sounds.  Abdominal:     General: Abdomen is flat. There is no distension.     Palpations: Abdomen is soft.     Tenderness: There is no abdominal tenderness. There is no guarding or rebound.  Musculoskeletal:     Right lower leg: Edema present.     Left lower leg: Edema present.     Comments: Patient with pitting lower extremity edema bilaterally.  Extremities warm and well-perfused.  Good pulses.  Neurovascularly intact.  Neurological:     Mental Status: He is alert.     ED Results / Procedures / Treatments   Labs (all labs ordered are listed, but only abnormal results are displayed) Labs Reviewed  BASIC METABOLIC PANEL - Abnormal; Notable for the following components:      Result Value   Glucose, Bld 118 (*)    BUN 21 (*)    Creatinine, Ser 1.72 (*)    GFR, Estimated 51 (*)    All other components within normal limits  CBC - Abnormal; Notable for the following components:   RBC 3.52 (*)  Hemoglobin 10.2 (*)    HCT 30.9 (*)    All other components within normal limits  I-STAT CG4 LACTIC ACID, ED  TROPONIN I (HIGH SENSITIVITY)    EKG EKG Interpretation Date/Time:  Wednesday February 27 2023 14:55:17 EDT Ventricular Rate:  93 PR Interval:  141 QRS Duration:  94 QT Interval:  355 QTC Calculation: 442 R Axis:   50  Text Interpretation: Sinus rhythm Confirmed by Estanislado Pandy [phone]) on 02/27/2023 4:28:51 PM  Radiology VAS [place] LOWER EXTREMITY VENOUS (DVT) (ONLY [facility] & [facility])  Result Date: 02/27/2023  Lower Venous DVT Study Patient Name:  Ronald Hampton  Date of Exam:   02/27/2023 Medical Rec #: [id number]                  Accession #:    [id number] Date of Birth: 02/11/1981                  Patient Gender: M Patient Age:   41 years Exam Location:  [facility] Procedure:      VAS [place] LOWER EXTREMITY  VENOUS (DVT) Referring Phys: Feliz Beam  --------------------------------------------------------------------------------  Indications: Pain, Swelling, and SOB.  Limitations: Body habitus and poor ultrasound/tissue interface. Comparison Study: No previous study. Performing Technologist: McKayla Maag RVT, VT  Examination Guidelines: A complete evaluation includes B-mode imaging, spectral Doppler, color Doppler, and power Doppler as needed of all accessible portions of each vessel. Bilateral testing is considered an integral part of a complete examination. Limited examinations for reoccurring indications may be performed as noted. The reflux portion of the exam is performed with the patient in reverse Trendelenburg.  +---------+---------------+---------+-----------+----------+--------------+ RIGHT    CompressibilityPhasicitySpontaneityPropertiesThrombus Aging +---------+---------------+---------+-----------+----------+--------------+ CFV      Full           Yes      Yes                                 +---------+---------------+---------+-----------+----------+--------------+ SFJ      Full                                                        +---------+---------------+---------+-----------+----------+--------------+ FV Prox  Full                                                        +---------+---------------+---------+-----------+----------+--------------+ FV Mid   Full                                                        +---------+---------------+---------+-----------+----------+--------------+ FV DistalFull                                                        +---------+---------------+---------+-----------+----------+--------------+ PFV      Full                                                        +---------+---------------+---------+-----------+----------+--------------+  POP      Full           Yes      Yes                                  +---------+---------------+---------+-----------+----------+--------------+ PTV      Full                                                        +---------+---------------+---------+-----------+----------+--------------+ PERO     Full                                         limited        +---------+---------------+---------+-----------+----------+--------------+   +--------+---------------+---------+-----------+----------+--------------------+ LEFT    CompressibilityPhasicitySpontaneityPropertiesThrombus Aging       +--------+---------------+---------+-----------+----------+--------------------+ CFV     Full           Yes      Yes                                       +--------+---------------+---------+-----------+----------+--------------------+ SFJ     Full                                                              +--------+---------------+---------+-----------+----------+--------------------+ FV Prox Full                                                              +--------+---------------+---------+-----------+----------+--------------------+ FV Mid                 Yes      Yes                  patent by color                                                           doppler              +--------+---------------+---------+-----------+----------+--------------------+ FV                                                   not visualized       Distal                                                                    +--------+---------------+---------+-----------+----------+--------------------+  PFV     Full                                                              +--------+---------------+---------+-----------+----------+--------------------+ POP     Full           Yes      Yes                                       +--------+---------------+---------+-----------+----------+--------------------+ PTV     Full                                                               +--------+---------------+---------+-----------+----------+--------------------+ PERO    Full                                                              +--------+---------------+---------+-----------+----------+--------------------+    Summary: RIGHT: - There is no evidence of deep vein thrombosis in the lower extremity. However, portions of this examination were limited- see technologist comments above.  - No cystic structure found in the popliteal fossa.  LEFT: - There is no evidence of deep vein thrombosis in the lower extremity. However, portions of this examination were limited- see technologist comments above.  - No cystic structure found in the popliteal fossa.  *See table(s) above for measurements and observations.    Preliminary    DG Chest 2 View  Result Date: 02/27/2023 CLINICAL DATA:  Shortness of breath bilateral leg swelling and pain EXAM: CHEST - 2 VIEW COMPARISON:  02/09/2023 FINDINGS: Low lung volumes. No focal opacity, pleural effusion or pneumothorax. Stable cardiomediastinal silhouette. IMPRESSION: Low lung volumes. Electronically Signed   By: Jasmine Pang M.D.   On: 02/27/2023 15:49    Procedures Procedures    Medications Ordered in ED Medications  oxyCODONE-acetaminophen (PERCOCET/ROXICET) 5-325 MG per tablet 1 tablet (1 tablet Oral Given 02/27/23 1648)  furosemide (LASIX) tablet 20 mg (20 mg Oral Given 02/27/23 1648)    ED Course/ Medical Decision Making/ A&P Clinical Course as of 02/27/23 1709  Wed Feb 27, 2023  1542 Basic metabolic panel(!) No significant metabolic derangement.  Appears near baseline renal function [TY]  1542 CBC(!) No leukocytosis.  Stable chronic anemia [TY]  1542 Lactic Acid, Venous: 1.8 Limb ischemia unlikely [TY]  1542 Troponin I (High Sensitivity): 4 ACS unlikely. [TY]    Clinical Course User Index [TY] Coral Spikes, DO                                 Medical Decision  Making 42 year old male presenting emergency department with bilateral lower extremity edema.  He is afebrile vitals reassuring.  Extremities warm well-perfused.  Has some r venous stasis type skin changes and some  minor erythema, but is being treated for cellulitis with linezolid.  Patient and mother report that redness is improving.  Workup today largely reassuring as noted in ED course.  Low suspicion for compartment syndrome given negative lactate.  No DVTs on ultrasound.  Will give a short course of Lasix recommend compression stockings.  Patient has follow-up with his primary doctor in a few days.  Given negative workup stable vitals and reassuring exam patient stable for discharge.  Amount and/or Complexity of Data Reviewed Independent Historian: parent    Details: Reports redness improved. External Data Reviewed: notes.    Details: Per noteReview, recentMated for cellulitis/Sepsis. Also per chart review PMH includes: " multiple myeloma, CKD, depression, PTSD, bipolar disorder, CVA, HTN, polysubstance use/IVDU, DM-2, endocarditis, osteomyelitis and discitis " Labs: ordered. Decision-making details documented in ED Course. Radiology: ordered.  Risk Prescription drug management.          Final Clinical Impression(s) / ED Diagnoses Final diagnoses:  Lower extremity edema    Rx / DC Orders ED Discharge Orders          Ordered    furosemide (LASIX) 20 MG tablet  Daily        02/27/23 1642              Coral Spikes, DO 02/27/23 1709

## 2023-02-27 NOTE — ED Triage Notes (Signed)
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

## 2023-02-27 NOTE — ED Notes (Signed)
Pt transported to XRAY °

## 2023-03-01 ENCOUNTER — Other Ambulatory Visit (HOSPITAL_COMMUNITY): Payer: Self-pay

## 2023-03-01 ENCOUNTER — Telehealth (HOSPITAL_COMMUNITY): Payer: Self-pay | Admitting: Physician Assistant

## 2023-03-01 MED ORDER — FUROSEMIDE 20 MG PO TABS
20.0000 mg | ORAL_TABLET | Freq: Every day | ORAL | 0 refills | Status: DC
  Filled 2023-03-01: qty 10, 10d supply, fill #0

## 2023-03-01 NOTE — Telephone Encounter (Cosign Needed)
Patient seen by colleague Dr. Maple Hudson in the emergency department.  He was prescribed Lasix, 20 mg at discharge.  Patient unable to pick up prescription (Lasix) at previously prescribed pharmacy.  Sent to [facility] pharmacy per patient request.  I did not personally assess or evaluate patient.

## 2023-03-04 ENCOUNTER — Telehealth (INDEPENDENT_AMBULATORY_CARE_PROVIDER_SITE_OTHER): Payer: Self-pay | Admitting: Primary Care

## 2023-03-04 NOTE — Telephone Encounter (Signed)
Spoke to pt about upcoming apt. Will be present

## 2023-03-05 ENCOUNTER — Inpatient Hospital Stay (INDEPENDENT_AMBULATORY_CARE_PROVIDER_SITE_OTHER): Payer: No Typology Code available for payment source | Admitting: Primary Care

## 2023-03-07 ENCOUNTER — Inpatient Hospital Stay: Payer: No Typology Code available for payment source | Admitting: Internal Medicine

## 2023-03-20 ENCOUNTER — Inpatient Hospital Stay: Payer: No Typology Code available for payment source | Admitting: Internal Medicine

## 2023-06-07 ENCOUNTER — Encounter (HOSPITAL_COMMUNITY): Payer: Self-pay

## 2023-06-07 ENCOUNTER — Other Ambulatory Visit (INDEPENDENT_AMBULATORY_CARE_PROVIDER_SITE_OTHER)
Admission: EM | Admit: 2023-06-07 | Discharge: 2023-06-12 | Disposition: A | Discharge status: Home / Self Care | Payer: No Typology Code available for payment source | Source: Home / Self Care | Admitting: Psychiatry

## 2023-06-07 ENCOUNTER — Emergency Department (HOSPITAL_COMMUNITY)
Admission: EM | Admit: 2023-06-07 | Discharge: 2023-06-07 | Disposition: A | Discharge status: Other Acute Inpatient Hospital | Payer: No Typology Code available for payment source | Attending: Emergency Medicine | Admitting: Emergency Medicine

## 2023-06-07 ENCOUNTER — Other Ambulatory Visit: Payer: Self-pay

## 2023-06-07 DIAGNOSIS — E119 Type 2 diabetes mellitus without complications: Secondary | ICD-10-CM | POA: Insufficient documentation

## 2023-06-07 DIAGNOSIS — R2689 Other abnormalities of gait and mobility: Secondary | ICD-10-CM | POA: Insufficient documentation

## 2023-06-07 DIAGNOSIS — I10 Essential (primary) hypertension: Secondary | ICD-10-CM | POA: Diagnosis not present

## 2023-06-07 DIAGNOSIS — F1120 Opioid dependence, uncomplicated: Secondary | ICD-10-CM | POA: Insufficient documentation

## 2023-06-07 DIAGNOSIS — F1910 Other psychoactive substance abuse, uncomplicated: Secondary | ICD-10-CM | POA: Diagnosis not present

## 2023-06-07 DIAGNOSIS — Z79899 Other long term (current) drug therapy: Secondary | ICD-10-CM | POA: Insufficient documentation

## 2023-06-07 DIAGNOSIS — I129 Hypertensive chronic kidney disease with stage 1 through stage 4 chronic kidney disease, or unspecified chronic kidney disease: Secondary | ICD-10-CM | POA: Insufficient documentation

## 2023-06-07 DIAGNOSIS — F3189 Other bipolar disorder: Secondary | ICD-10-CM | POA: Insufficient documentation

## 2023-06-07 DIAGNOSIS — N1831 Chronic kidney disease, stage 3a: Secondary | ICD-10-CM | POA: Insufficient documentation

## 2023-06-07 DIAGNOSIS — Z8673 Personal history of transient ischemic attack (TIA), and cerebral infarction without residual deficits: Secondary | ICD-10-CM | POA: Insufficient documentation

## 2023-06-07 DIAGNOSIS — Z8619 Personal history of other infectious and parasitic diseases: Secondary | ICD-10-CM | POA: Insufficient documentation

## 2023-06-07 DIAGNOSIS — R600 Localized edema: Secondary | ICD-10-CM | POA: Diagnosis not present

## 2023-06-07 DIAGNOSIS — Z9151 Personal history of suicidal behavior: Secondary | ICD-10-CM | POA: Insufficient documentation

## 2023-06-07 DIAGNOSIS — C9000 Multiple myeloma not having achieved remission: Secondary | ICD-10-CM | POA: Insufficient documentation

## 2023-06-07 DIAGNOSIS — F1510 Other stimulant abuse, uncomplicated: Secondary | ICD-10-CM | POA: Insufficient documentation

## 2023-06-07 LAB — CBC
HCT: 37.2 % — ABNORMAL LOW (ref 39.0–52.0)
Hemoglobin: 12.4 g/dL — ABNORMAL LOW (ref 13.0–17.0)
MCH: 27.7 pg (ref 26.0–34.0)
MCHC: 33.3 g/dL (ref 30.0–36.0)
MCV: 83 fL (ref 80.0–100.0)
Platelets: 278 10*3/uL (ref 150–400)
RBC: 4.48 MIL/uL (ref 4.22–5.81)
RDW: 14.7 % (ref 11.5–15.5)
WBC: 8 10*3/uL (ref 4.0–10.5)
nRBC: 0 % (ref 0.0–0.2)

## 2023-06-07 LAB — ETHANOL: Alcohol, Ethyl (B): <10 mg/dL (ref <10)

## 2023-06-07 LAB — RAPID URINE DRUG SCREEN, HOSP PERFORMED
Amphetamines: POSITIVE — AB
Barbiturates: NOT DETECTED
Benzodiazepines: NOT DETECTED
Cocaine: NOT DETECTED
Opiates: NOT DETECTED
Tetrahydrocannabinol: NOT DETECTED

## 2023-06-07 LAB — COMPREHENSIVE METABOLIC PANEL
ALT: 12 U/L (ref 0–44)
AST: 18 U/L (ref 15–41)
Albumin: 3.5 g/dL (ref 3.5–5.0)
Alkaline Phosphatase: 116 U/L (ref 38–126)
Anion gap: 10 (ref 5–15)
BUN: 20 mg/dL (ref 6–20)
CO2: 21 mmol/L — ABNORMAL LOW (ref 22–32)
Calcium: 9.3 mg/dL (ref 8.9–10.3)
Chloride: 104 mmol/L (ref 98–111)
Creatinine, Ser: 1.55 mg/dL — ABNORMAL HIGH (ref 0.61–1.24)
GFR, Estimated: 57 mL/min — ABNORMAL LOW (ref >60)
Glucose, Bld: 131 mg/dL — ABNORMAL HIGH (ref 70–99)
Potassium: 3.8 mmol/L (ref 3.5–5.1)
Sodium: 135 mmol/L (ref 135–145)
Total Bilirubin: 0.5 mg/dL (ref <1.2)
Total Protein: 7.5 g/dL (ref 6.5–8.1)

## 2023-06-07 MED ORDER — ACETAMINOPHEN 325 MG PO TABS: 650.0000 mg | ORAL_TABLET | Freq: Four times a day (QID) | ORAL | Status: DC | PRN

## 2023-06-07 MED ORDER — HYDROXYZINE HCL 25 MG PO TABS: 25.0000 mg | ORAL_TABLET | Freq: Three times a day (TID) | ORAL | Status: DC | PRN

## 2023-06-07 MED ORDER — ALUM & MAG HYDROXIDE-SIMETH 200-200-20 MG/5ML PO SUSP
30.0000 mL | ORAL | Status: DC | PRN
Start: 2023-06-07 — End: 2023-06-12

## 2023-06-07 MED ORDER — MAGNESIUM HYDROXIDE 400 MG/5ML PO SUSP
30.0000 mL | Freq: Every day | ORAL | Status: DC | PRN
Start: 2023-06-07 — End: 2023-06-12

## 2023-06-07 MED ORDER — ONDANSETRON 4 MG PO TBDP
4.0000 mg | ORAL_TABLET | Freq: Once | ORAL | Status: AC
  Administered 2023-06-07: 4 mg via ORAL
  Filled 2023-06-07: qty 1

## 2023-06-07 NOTE — ED Provider Notes (Signed)
  Physical Exam  BP (!) 140/95 (BP Location: Right Arm)   Pulse 94   Temp 98.6 F (37 C) (Oral)   Resp 14   Ht 5\' 10"  (1.778 m)   Wt 99.8 kg   SpO2 98%   BMI 31.57 kg/m   Physical Exam Constitutional:      General: He is not in acute distress.    Appearance: Normal appearance.  HENT:     Head: Normocephalic and atraumatic.     Nose: No congestion or rhinorrhea.  Eyes:     General:        Right eye: No discharge.        Left eye: No discharge.     Extraocular Movements: Extraocular movements intact.     Pupils: Pupils are equal, round, and reactive to light.  Cardiovascular:     Rate and Rhythm: Normal rate and regular rhythm.     Heart sounds: No murmur heard. Pulmonary:     Effort: No respiratory distress.     Breath sounds: No wheezing or rales.  Abdominal:     General: There is no distension.     Tenderness: There is no abdominal tenderness.  Musculoskeletal:        General: Normal range of motion.     Cervical back: Normal range of motion.  Skin:    General: Skin is warm and dry.  Neurological:     General: No focal deficit present.     Mental Status: He is alert.     Procedures  Procedures  ED Course / MDM    Medical Decision Making Amount and/or Complexity of Data Reviewed Labs: ordered. ECG/medicine tests: ordered.  Risk Prescription drug management.   Patient received in handoff.  Polysubstance abuse wishing to enter inpatient detox.  TTS evaluated the patient and will admit to the [facility].       Glendora Score, MD 06/07/23 1155

## 2023-06-07 NOTE — ED Notes (Signed)
Patient is sleeping. Respirations equal and unlabored, skin warm and dry. No change in assessment or acuity. Routine safety checks conducted according to facility protocol. Will continue to monitor for safety.   

## 2023-06-07 NOTE — ED Provider Notes (Signed)
[place] EMERGENCY DEPARTMENT AT [facility] Provider Note   CSN: [id number] Arrival date & time: 06/07/23  0003     History  Chief Complaint  Patient presents with   Drug Problem    Ronald Hampton is a 42 y.o. male.  The history is provided by the patient.  Drug Problem  Ronald Hampton is a 42 y.o. male who presents to the Emergency Department complaining of detox.  He presents to the emergency department for detox from heroin and meth.  He uses these IV, last use was today.  He usually uses 2-3 times daily.  He states that he has been a user for 20 years and he decided to get off the drugs to help take care of his health.  No SI, HI.  He complains of nausea, no additional systemic symptoms.  Hx/o cva, MM. No SI/HI.      Home Medications Prior to Admission medications   Medication Sig Start Date End Date Taking? Authorizing Provider  furosemide (LASIX) 20 MG tablet Take 1 tablet (20 mg total) by mouth daily for 2 days. 03/01/23 03/11/23  Henderly, Britni A, PA-C  oxyCODONE (ROXICODONE) 5 MG immediate release tablet Take 1 tablet (5 mg total) by mouth every 6 (six) hours as needed. 02/17/23 02/17/24  Meredeth Ide, MD      Allergies    Sulfa antibiotics, Trazodone and nefazodone, and Zofran [ondansetron hcl]    Review of Systems   Review of Systems  All other systems reviewed and are negative.   Physical Exam Updated Vital Signs BP (!) 161/122 (BP Location: Right Arm)   Pulse 86   Temp 98 F (36.7 C) (Oral)   Resp 14   Ht 5\' 10"  (1.778 m)   Wt 99.8 kg   SpO2 95%   BMI 31.57 kg/m  Physical Exam Vitals and nursing note reviewed.  Constitutional:      Appearance: He is well-developed.  HENT:     Head: Normocephalic and atraumatic.  Cardiovascular:     Rate and Rhythm: Normal rate and regular rhythm.     Heart sounds: No murmur heard. Pulmonary:     Effort: Pulmonary effort is normal. No respiratory distress.     Breath sounds:  Normal breath sounds.  Abdominal:     Palpations: Abdomen is soft.     Tenderness: There is no abdominal tenderness. There is no guarding or rebound.  Musculoskeletal:        General: No tenderness.     Comments: Trace edema to bilateral lower extremities  Skin:    General: Skin is warm and dry.  Neurological:     Mental Status: He is alert and oriented to person, place, and time.  Psychiatric:        Behavior: Behavior normal.     ED Results / Procedures / Treatments   Labs (all labs ordered are listed, but only abnormal results are displayed) Labs Reviewed  COMPREHENSIVE METABOLIC PANEL - Abnormal; Notable for the following components:      Result Value   CO2 21 (*)    Glucose, Bld 131 (*)    Creatinine, Ser 1.55 (*)    GFR, Estimated 57 (*)    All other components within normal limits  CBC - Abnormal; Notable for the following components:   Hemoglobin 12.4 (*)    HCT 37.2 (*)    All other components within normal limits  RAPID URINE DRUG SCREEN, HOSP PERFORMED - Abnormal;  Notable for the following components:   Amphetamines POSITIVE (*)    All other components within normal limits  ETHANOL    EKG None  Radiology No results found.  Procedures Procedures    Medications Ordered in ED Medications - No data to display  ED Course/ Medical Decision Making/ A&P                                 Medical Decision Making Amount and/or Complexity of Data Reviewed Labs: ordered.  Risk Prescription drug management.   Patient here seeking detox from drugs.  He does not appear to be acutely intoxicated at this time.  He is not actively suicidal, homicidal or psychotic.  Labs are near his baseline with stable renal insufficiency.  Discussed with behavioral health-he will need TTS eval to see if he is a candidate for [facility].  TTS consult placed.        Final Clinical Impression(s) / ED Diagnoses Final diagnoses:  None    Rx / DC Orders ED Discharge Orders      None         Tilden Fossa, MD 06/07/23 [phone]

## 2023-06-07 NOTE — Progress Notes (Signed)
Pt has been accepted to [facility]. [facility]) on today 06/07/2023. Bed assignment: Facility Based Crisis [facility]). [address] [address]   Pt meets inpatient criteria per: Phebe Colla   Attending Physician will be: Dr. Arlyss Queen MD   Report can be called to: [phone]  Pt can arrive anytime   Care Team Notified: Phebe Colla NP, Ahmed Prima, RN    [place] Dima Mini LCSW-A   06/07/2023 4:28 PM

## 2023-06-07 NOTE — ED Triage Notes (Signed)
Request detox from heroin and methamphetamine.   Last drug use - today.

## 2023-06-07 NOTE — ED Notes (Signed)
Patient observed/assessed at bedside lying in bed asleep. Patient alert and oriented to self and location. Affect is flat and patient seems agitated to be disturbed from sleeping. Marland Kitchen He denies A/V/H. He denies having any thoughts/plan of self harm and harm towards others.  Verbalizes no further complaints at this time, says he just wants to sleep.Will continue to monitor and support.

## 2023-06-07 NOTE — ED Notes (Signed)
Called safe transport for pt pick

## 2023-06-07 NOTE — Group Note (Deleted)
Group Topic: Decisional Balance/Substance Abuse  Group Date: 06/07/2023 Start Time: 10:00 AM End Time: 11:00 AM Facilitators: Donnie Coffin, NT  Department: [facility]  Number of Participants: 2  Group Focus: acceptance, chemical dependency education, chemical dependency issues, and communication Treatment Modality:  Skills Training Interventions utilized were group exercise Purpose: improve communication skills   Name: Ronald Hampton Date of Birth: 1981/05/03  MR: [id number]    Level of Participation: {THERAPIES; PSYCH GROUP PARTICIPATION GMWNU:27253} Quality of Participation: {THERAPIES; PSYCH QUALITY OF PARTICIPATION:23992} Interactions with others: {THERAPIES; PSYCH INTERACTIONS:23993} Mood/Affect: {THERAPIES; PSYCH MOOD/AFFECT:23994} Triggers (if applicable): *** Cognition: {THERAPIES; PSYCH COGNITION:23995} Progress: {THERAPIES; PSYCH PROGRESS:23997} Response: *** Plan: {THERAPIES; PSYCH GUYQ:03474}  Patients Problems:  Patient Active Problem List   Diagnosis Date Noted   Lower extremity cellulitis 02/10/2023   Severe sepsis (HCC) 02/10/2023   Pressure injury of skin 06/04/2022   Fall 06/03/2022   Acute kidney injury superimposed on chronic kidney disease (HCC) 06/03/2022   COVID-19 virus infection 06/03/2022   Hypercalcemia 06/03/2022   Monoclonal gammopathy 06/03/2022   Constipation 06/03/2022   History of discitis 06/03/2022   History of bacteremia 06/03/2022   History of osteomyelitis 06/03/2022   Transaminitis 06/03/2022   Hyperbilirubinemia 06/03/2022   Normocytic anemia 06/03/2022   Depression 06/03/2022   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

## 2023-06-07 NOTE — Consult Note (Addendum)
[facility] ED ASSESSMENT   Reason for Consult:  Substance use disorder requesting detox Referring Physician:  Tilden Fossa Patient Identification: Ronald Hampton MRN:  [id number] ED Chief Complaint: Polysubstance abuse [facility])  Diagnosis:  Principal Problem:   Polysubstance abuse [facility])   ED Assessment Time Calculation: Start Time: 0700 Stop Time: 0758 Total Time in Minutes (Assessment Completion): 58  HPI:  Ronald Hampton is a 42 y.o. male patient came to the emergency department requesting detox from heroin and meth.  He has a history of depression, bipolar disorder, DM2, HTN, headaches and a plate and screws in his right leg and ankle due to injuries sustained when he fell off a roof in 2016.  Subjective:   Ronald Hampton is a 42 y.o. male patient came to the emergency department requesting detox from heroin and meth.  He has a history of depression, bipolar disorder, DM2, HTN, headaches and a plate and screws in his right leg and ankle due to injuries sustained when he fell off a roof in 2016.  Ronald Hampton, 42 y.o., male patient seen face to face by this provider, consulted with Dr. Lucianne Muss; and chart reviewed on 06/07/23.  On evaluation Ronald Hampton reports he wants help with detox from heroin and meth. Patient says he is disabled receiving benefits, lives in [place], and has solid support from his mother, brother and aunt.  He says he has not taken any psychiatric medications for his depression and bipolar disorder for at least 6 months.  He does not currently have outpatient mental health providers.  He says he has used heroin for 22 years and meth for 12 years.  Patient denies all other substance use; he does not smoke cigarettes.  During evaluation Ronald Hampton is sitting on a stretcher in no acute distress.  He is alert, oriented x 4, calm, cooperative and attentive.  His mood is euthymic with congruent affect.  He has  normal speech, and behavior.  Objectively there is no evidence of psychosis/mania or delusional thinking.  Patient is able to converse coherently, goal directed thoughts, no distractibility, or pre-occupation.  He denies suicidal/self-harm/homicidal ideation, psychosis, and paranoia.  Patient answered questions appropriately.    Patient is abusing substances and meets criteria for inpatient detox for stabilization and treatment.   Past Psychiatric History: depression, bipolar disorder, polysubstance abuse  Risk to Self or Others: Is the patient at risk to self? No Has the patient been a risk to self in the past 6 months? No Has the patient been a risk to self within the distant past? No Is the patient a risk to others? No Has the patient been a risk to others in the past 6 months? No Has the patient been a risk to others within the distant past? No  [place] Scale:  Flowsheet Row ED from 06/07/2023 in [facility] Emergency Department at [facility] ED from 02/27/2023 in [facility] Emergency Department at [facility] ED to Hosp-Admission (Discharged) from 02/09/2023 in [facility] 45M KIDNEY UNIT  C-SSRS RISK CATEGORY No Risk No Risk No Risk       Substance Abuse:   Heroin and meth  Past Medical History:  Past Medical History:  Diagnosis Date   Bipolar 1 disorder (HCC)    Depression    Endocarditis    Multiple myeloma (HCC)    PTSD (post-traumatic stress disorder)    Renal disorder    Stroke (HCC)  Past Surgical History:  Procedure Laterality Date   HERNIA REPAIR     Right Foot Surgery     Family History: History reviewed. No pertinent family history. Family Psychiatric  History: None noted Social History:  Social History   Substance and Sexual Activity  Alcohol Use Not Currently     Social History   Substance and Sexual Activity  Drug Use Not Currently   Types: Heroin    Social History   Socioeconomic History   Marital status: Legally  Separated    Spouse name: Not on file   Number of children: Not on file   Years of education: Not on file   Highest education level: Not on file  Occupational History   Not on file  Tobacco Use   Smoking status: Never   Smokeless tobacco: Never  Vaping Use   Vaping status: Never Used  Substance and Sexual Activity   Alcohol use: Not Currently   Drug use: Not Currently    Types: Heroin   Sexual activity: Yes  Other Topics Concern   Not on file  Social History Narrative   Not on file   Social Determinants of Health   Financial Resource Strain: Not on file  Food Insecurity: No Food Insecurity (02/10/2023)   Hunger Vital Sign    Worried About Running Out of Food in the Last Year: Never true    Ran Out of Food in the Last Year: Never true  Transportation Needs: No Transportation Needs (02/10/2023)   PRAPARE - Administrator, Civil Service (Medical): No    Lack of Transportation (Non-Medical): No  Physical Activity: Not on file  Stress: Not on file  Social Connections: Not on file   Additional Social History: Patient is on disability     Allergies:   Allergies  Allergen Reactions   Sulfa Antibiotics Anaphylaxis   Trazodone And Nefazodone    Zofran [Ondansetron Hcl] Rash    Labs:  Results for orders placed or performed during the hospital encounter of 06/07/23 (from the past 48 hour(s))  Rapid urine drug screen (hospital performed)     Status: Abnormal   Collection Time: 06/07/23 12:43 AM  Result Value Ref Range   Opiates NONE DETECTED NONE DETECTED   Cocaine NONE DETECTED NONE DETECTED   Benzodiazepines NONE DETECTED NONE DETECTED   Amphetamines POSITIVE (A) NONE DETECTED   Tetrahydrocannabinol NONE DETECTED NONE DETECTED   Barbiturates NONE DETECTED NONE DETECTED    Comment: (NOTE) DRUG SCREEN FOR MEDICAL PURPOSES ONLY.  IF CONFIRMATION IS NEEDED FOR ANY PURPOSE, NOTIFY LAB WITHIN 5 DAYS.  LOWEST DETECTABLE LIMITS FOR URINE DRUG SCREEN Drug Class                      Cutoff (ng/mL) Amphetamine and metabolites    1000 Barbiturate and metabolites    200 Benzodiazepine                 200 Opiates and metabolites        300 Cocaine and metabolites        300 THC                            50 Performed at [facility], [address], [place], [place] [address]   Comprehensive metabolic panel     Status: Abnormal   Collection Time: 06/07/23 12:58 AM  Result Value Ref Range   Sodium  135 135 - 145 mmol/L   Potassium 3.8 3.5 - 5.1 mmol/L   Chloride 104 98 - 111 mmol/L   CO2 21 (L) 22 - 32 mmol/L   Glucose, Bld 131 (H) 70 - 99 mg/dL    Comment: Glucose reference range applies only to samples taken after fasting for at least 8 hours.   BUN 20 6 - 20 mg/dL   Creatinine, Ser 9.14 (H) 0.61 - 1.24 mg/dL   Calcium 9.3 8.9 - 78.2 mg/dL   Total Protein 7.5 6.5 - 8.1 g/dL   Albumin 3.5 3.5 - 5.0 g/dL   AST 18 15 - 41 U/L   ALT 12 0 - 44 U/L   Alkaline Phosphatase 116 38 - 126 U/L   Total Bilirubin 0.5 <1.2 mg/dL   GFR, Estimated 57 (L) >60 mL/min    Comment: (NOTE) Calculated using the CKD-EPI Creatinine Equation (2021)    Anion gap 10 5 - 15    Comment: Performed at [facility], [address] [place], [place] [address]  Ethanol     Status: None   Collection Time: 06/07/23 12:58 AM  Result Value Ref Range   Alcohol, Ethyl (B) <10 <10 mg/dL    Comment: (NOTE) Lowest detectable limit for serum alcohol is 10 mg/dL.  For medical purposes only. Performed at [facility], [address] [place], [place] [address]   cbc     Status: Abnormal   Collection Time: 06/07/23 12:58 AM  Result Value Ref Range   WBC 8.0 4.0 - 10.5 K/uL   RBC 4.48 4.22 - 5.81 MIL/uL   Hemoglobin 12.4 (L) 13.0 - 17.0 g/dL   HCT 78.4 (L) 69.6 - 29.5 %   MCV 83.0 80.0 - 100.0 fL   MCH 27.7 26.0 - 34.0 pg   MCHC 33.3 30.0 - 36.0 g/dL   RDW 28.4 13.2 - 44.0 %   Platelets 278 150 - 400 K/uL   nRBC 0.0 0.0 - 0.2 %    Comment: Performed at  [facility] Lab, [address] [place], [place] [address]    No current facility-administered medications for this encounter.   Current Outpatient Medications  Medication Sig Dispense Refill   oxyCODONE (ROXICODONE) 5 MG immediate release tablet Take 1 tablet (5 mg total) by mouth every 6 (six) hours as needed. 15 tablet 0    Musculoskeletal: Strength & Muscle Tone: within normal limits Gait & Station: normal Patient leans: N/A   Psychiatric Specialty Exam: Presentation  General Appearance:  Appropriate for Environment  Eye Contact: Good  Speech: Clear and Coherent  Speech Volume: Normal  Handedness: Right   Mood and Affect  Mood: Euthymic  Affect: Congruent   Thought Process  Thought Processes: Coherent  Descriptions of Associations:Intact  Orientation:Full (Time, Place and Person)  Thought Content:WDL  History of Schizophrenia/Schizoaffective disorder:No data recorded Duration of Psychotic Symptoms:No data recorded Hallucinations:Hallucinations: None  Ideas of Reference:None  Suicidal Thoughts:Suicidal Thoughts: No  Homicidal Thoughts:Homicidal Thoughts: No   Sensorium  Memory: Immediate Good; Recent Good; Remote Good  Judgment: Intact  Insight: Fair   Art therapist  Concentration: Good  Attention Span: Good  Recall: Good  Fund of Knowledge: Good  Language: Good   Psychomotor Activity  Psychomotor Activity: Psychomotor Activity: Normal   Assets  Assets: Communication Skills; Desire for Improvement; Leisure Time; Social Support    Sleep  Sleep: Sleep: Good Number of Hours of Sleep: 8   Physical Exam: Physical Exam Vitals and  nursing note reviewed.  Eyes:     Pupils: Pupils are equal, round, and reactive to light.  Pulmonary:     Effort: Pulmonary effort is normal.  Skin:    General: Skin is dry.  Neurological:     Mental Status: He is alert and oriented to person, place, and time.     Review of Systems  Musculoskeletal:        Plate and pins in right leg and ankle  Psychiatric/Behavioral:  Positive for substance abuse.   All other systems reviewed and are negative.  Blood pressure (!) 150/99, pulse 82, temperature 98.5 F (36.9 C), resp. rate 16, height 5\' 10"  (1.778 m), weight 99.8 kg, SpO2 98%. Body mass index is 31.57 kg/m.  Medical Decision Making: Patient case reviewed and discussed with Dr Lucianne Muss.  Patient is abusing substances and wants assistance with detox. He meets criteria for inpatient detox for stabilization and treatment.  Problem 1: Polysubstance abuse   Disposition:  Recommend inpatient detoxification for stabilization and treatment.  Thomes Lolling, NP 06/07/2023 8:40 AM

## 2023-06-07 NOTE — Group Note (Signed)
Group Topic: Communication  Group Date: 06/07/2023 Start Time: 1900 End Time: 1905 Facilitators: Alvie Heidelberg, NT  Department: [facility]  Number of Participants: 0 Group Focus: N/A other  N/A Treatment Modality:  N/A Interventions utilized were  N/A Purpose: n/a nobody wanted to participate two patients were resting in their rooms and [name] was really into a christmas movie and wasn't really up to talking much.   Name: Ronald Hampton Date of Birth: [birth year]  MR: [id number]    Level of Participation: N/A Quality of Participation: N/A Interactions with others: N/A Mood/Affect:N/A Triggers (if applicable): N/A Cognition: N/A Progress: N/A Response: N/A Plan: N/A  Patients Problems:  Patient Active Problem List   Diagnosis Date Noted   Lower extremity cellulitis 02/10/2023   Severe sepsis (HCC) 02/10/2023   Pressure injury of skin 06/04/2022   Fall 06/03/2022   Acute kidney injury superimposed on chronic kidney disease (HCC) 06/03/2022   COVID-19 virus infection 06/03/2022   Hypercalcemia 06/03/2022   Monoclonal gammopathy 06/03/2022   Constipation 06/03/2022   History of discitis 06/03/2022   History of bacteremia 06/03/2022   History of osteomyelitis 06/03/2022   Transaminitis 06/03/2022   Hyperbilirubinemia 06/03/2022   Normocytic anemia 06/03/2022   Depression 06/03/2022   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

## 2023-06-07 NOTE — ED Notes (Signed)
Patient admitted directly from [facility] for detox from opiates and methamphetamine which he uses IV.  Patient states last use was last night at least a gram.  He has active track marks on left antecubital space.  No abscesses observed.  Patient uses a cane which is stored in his locker.  Patient given a walker to use while on unit.  Patient also has an ankle monitor on and charging box and cord are in report room.  Patient states that he has degenerative disk disease and he is missing vertebrae which makes it hard for him to walk.  Patient states that he was also in a coma one year ago due to a meth overdose.  Patient is pleasant and cooperative with admission process.  His affect is sad and mood depressed. He was oriented to unit and shown to his room.  Patient then given dinner.  He is now resting in his room without issue or complaint.  Patient encouraged to seek out staff if he feels opiate withdrawal symptoms.  Denies avh shi or plan.

## 2023-06-08 DIAGNOSIS — F1510 Other stimulant abuse, uncomplicated: Secondary | ICD-10-CM

## 2023-06-08 MED ORDER — HYDROXYZINE HCL 25 MG PO TABS
25.0000 mg | ORAL_TABLET | Freq: Four times a day (QID) | ORAL | Status: DC | PRN
  Filled 2023-06-08: qty 1

## 2023-06-08 MED ORDER — LOPERAMIDE HCL 2 MG PO CAPS: 2.0000 mg | ORAL_CAPSULE | ORAL | Status: DC | PRN

## 2023-06-08 MED ORDER — METHOCARBAMOL 500 MG PO TABS: 500.0000 mg | ORAL_TABLET | Freq: Three times a day (TID) | ORAL | Status: DC | PRN

## 2023-06-08 MED ORDER — CLONIDINE HCL 0.1 MG PO TABS
0.1000 mg | ORAL_TABLET | Freq: Once | ORAL | Status: AC
  Administered 2023-06-08: 0.1 mg via ORAL
  Filled 2023-06-08: qty 1

## 2023-06-08 MED ORDER — ONDANSETRON 4 MG PO TBDP: 4.0000 mg | ORAL_TABLET | Freq: Four times a day (QID) | ORAL | Status: DC | PRN

## 2023-06-08 MED ORDER — NAPROXEN 500 MG PO TABS
500.0000 mg | ORAL_TABLET | Freq: Two times a day (BID) | ORAL | Status: DC | PRN
  Administered 2023-06-08: 500 mg via ORAL
  Filled 2023-06-08: qty 1

## 2023-06-08 MED ORDER — DICYCLOMINE HCL 20 MG PO TABS: 20.0000 mg | ORAL_TABLET | Freq: Four times a day (QID) | ORAL | Status: DC | PRN

## 2023-06-08 NOTE — ED Notes (Signed)
Pt sleeping in no acute distress. RR even and unlabored. Environment secured. Will continue to monitor for safety. 

## 2023-06-08 NOTE — ED Notes (Signed)
Notified Veronique, NP, of pt's elevated BP reading dinamap 167/121 and manually 150/128 HR 114. Received one time order for Clonidine. Dr. Jerrel Ivory responded stating he was coming to assess pt. Pt c/o mild HA to front mid forehead. Receiving Naproxen for sx relief. Will continue to monitor for safety.

## 2023-06-08 NOTE — ED Provider Notes (Signed)
Behavioral Health Progress Note  Date and Time: 06/08/2023 1:08 PM Name: Ronald Hampton MRN:  [id number]  Subjective:   The patient is a 42 year old male with a history of opioid and methamphetamine use.  He was admitted to the behavioral hospital 5 years ago for suicidal thoughts and was given a diagnosis of bipolar affective disorder 1.  He reports that he has disability income and lives in [place].  The patient has denied suicidal thoughts consistently since his arrival to the [facility] emergency department.  The patient was admitted from that facility to the facility based crisis for substance use treatment.  The patient desires to attend an intensive outpatient program in [place].  The patient has noted medical comorbidities of chronic kidney disease (GFR approximately 55), history of CVA, and history of hepatitis C.  He ambulates using a walker.  Patient also reports having multiple myeloma.  He denies experiencing any sick symptoms.  He denies taking any medications for his medical problems on a regular basis.  The patient denies auditory/visual hallucinations.  The patient reports good mood, appetite, and sleep. They deny suicidal and homicidal thoughts. The patient denies side effects from their medications.  Review of systems as below. The patient denies experiencing any withdrawal symptoms.    Diagnosis:  Final diagnoses:  Methamphetamine abuse (HCC)    Total Time spent with patient: 15 minutes  Past Psychiatric History: See H&P and above Past Medical History: See H&P and above Family History: See H&P and above Family Psychiatric  History: See H&P and above Social History: See H&P and above  Additional Social History:     none                    Sleep: fair  Appetite:  fair  Current Medications:  Current Facility-Administered Medications  Medication Dose Route Frequency Provider Last Rate Last Admin   acetaminophen (TYLENOL) tablet 650 mg  650 mg  Oral Q6H PRN Weber, Kyra A, NP       alum & mag hydroxide-simeth (MAALOX/MYLANTA) 200-200-20 MG/5ML suspension 30 mL  30 mL Oral Q4H PRN Weber, Kyra A, NP       hydrOXYzine (ATARAX) tablet 25 mg  25 mg Oral TID PRN Weber, Kyra A, NP       magnesium hydroxide (MILK OF MAGNESIA) suspension 30 mL  30 mL Oral Daily PRN Weber, Kyra A, NP       Current Outpatient Medications  Medication Sig Dispense Refill   oxyCODONE (ROXICODONE) 5 MG immediate release tablet Take 1 tablet (5 mg total) by mouth every 6 (six) hours as needed. 15 tablet 0    Labs  Lab Results:  Admission on 06/07/2023, Discharged on 06/07/2023  Component Date Value Ref Range Status   Sodium 06/07/2023 135  135 - 145 mmol/L Final   Potassium 06/07/2023 3.8  3.5 - 5.1 mmol/L Final   Chloride 06/07/2023 104  98 - 111 mmol/L Final   CO2 06/07/2023 21 (L)  22 - 32 mmol/L Final   Glucose, Bld 06/07/2023 131 (H)  70 - 99 mg/dL Final   Glucose reference range applies only to samples taken after fasting for at least 8 hours.   BUN 06/07/2023 20  6 - 20 mg/dL Final   Creatinine, Ser 06/07/2023 1.55 (H)  0.61 - 1.24 mg/dL Final   Calcium 78/29/5621 9.3  8.9 - 10.3 mg/dL Final   Total Protein 30/86/5784 7.5  6.5 - 8.1 g/dL Final   Albumin 69/62/9528  3.5  3.5 - 5.0 g/dL Final   AST 56/21/3086 18  15 - 41 U/L Final   ALT 06/07/2023 12  0 - 44 U/L Final   Alkaline Phosphatase 06/07/2023 116  38 - 126 U/L Final   Total Bilirubin 06/07/2023 0.5  <1.2 mg/dL Final   GFR, Estimated 06/07/2023 57 (L)  >60 mL/min Final   Comment: (NOTE) Calculated using the CKD-EPI Creatinine Equation (2021)    Anion gap 06/07/2023 10  5 - 15 Final   Performed at [facility], [address], [place], [place] [address]   Alcohol, Ethyl (B) 06/07/2023 <10  <10 mg/dL Final   Comment: (NOTE) Lowest detectable limit for serum alcohol is 10 mg/dL.  For medical purposes only. Performed at [facility], [address], [place], [place] [address]     WBC 06/07/2023 8.0  4.0 - 10.5 K/uL Final   RBC 06/07/2023 4.48  4.22 - 5.81 MIL/uL Final   Hemoglobin 06/07/2023 12.4 (L)  13.0 - 17.0 g/dL Final   HCT 28/41/3244 37.2 (L)  39.0 - 52.0 % Final   MCV 06/07/2023 83.0  80.0 - 100.0 fL Final   MCH 06/07/2023 27.7  26.0 - 34.0 pg Final   MCHC 06/07/2023 33.3  30.0 - 36.0 g/dL Final   RDW 07/18/7251 14.7  11.5 - 15.5 % Final   Platelets 06/07/2023 278  150 - 400 K/uL Final   nRBC 06/07/2023 0.0  0.0 - 0.2 % Final   Performed at [facility], [address] [place], [place] [address]   Opiates 06/07/2023 NONE DETECTED  NONE DETECTED Final   Cocaine 06/07/2023 NONE DETECTED  NONE DETECTED Final   Benzodiazepines 06/07/2023 NONE DETECTED  NONE DETECTED Final   Amphetamines 06/07/2023 POSITIVE (A)  NONE DETECTED Final   Tetrahydrocannabinol 06/07/2023 NONE DETECTED  NONE DETECTED Final   Barbiturates 06/07/2023 NONE DETECTED  NONE DETECTED Final   Comment: (NOTE) DRUG SCREEN FOR MEDICAL PURPOSES ONLY.  IF CONFIRMATION IS NEEDED FOR ANY PURPOSE, NOTIFY LAB WITHIN 5 DAYS.  LOWEST DETECTABLE LIMITS FOR URINE DRUG SCREEN Drug Class                     Cutoff (ng/mL) Amphetamine and metabolites    1000 Barbiturate and metabolites    200 Benzodiazepine                 200 Opiates and metabolites        300 Cocaine and metabolites        300 THC                            50 Performed at [facility], [address], [place], [place] [address]   Admission on 02/27/2023, Discharged on 02/27/2023  Component Date Value Ref Range Status   Sodium 02/27/2023 135  135 - 145 mmol/L Final   Potassium 02/27/2023 4.1  3.5 - 5.1 mmol/L Final   Chloride 02/27/2023 100  98 - 111 mmol/L Final   CO2 02/27/2023 25  22 - 32 mmol/L Final   Glucose, Bld 02/27/2023 118 (H)  70 - 99 mg/dL Final   Glucose reference range applies only to samples taken after fasting for at least 8 hours.   BUN 02/27/2023 21 (H)  6 - 20 mg/dL Final   Creatinine,  Ser 02/27/2023 1.72 (H)  0.61 - 1.24 mg/dL Final   Calcium  02/27/2023 9.1  8.9 - 10.3 mg/dL Final   GFR, Estimated 02/27/2023 51 (L)  >60 mL/min Final   Comment: (NOTE) Calculated using the CKD-EPI Creatinine Equation (2021)    Anion gap 02/27/2023 10  5 - 15 Final   Performed at [facility], [address], [place], [place] [address]   WBC 02/27/2023 7.3  4.0 - 10.5 K/uL Final   RBC 02/27/2023 3.52 (L)  4.22 - 5.81 MIL/uL Final   Hemoglobin 02/27/2023 10.2 (L)  13.0 - 17.0 g/dL Final   HCT 11/91/4782 30.9 (L)  39.0 - 52.0 % Final   MCV 02/27/2023 87.8  80.0 - 100.0 fL Final   MCH 02/27/2023 29.0  26.0 - 34.0 pg Final   MCHC 02/27/2023 33.0  30.0 - 36.0 g/dL Final   RDW 95/62/1308 13.2  11.5 - 15.5 % Final   Platelets 02/27/2023 360  150 - 400 K/uL Final   nRBC 02/27/2023 0.0  0.0 - 0.2 % Final   Performed at [facility], [address] [place], [place] [address]   Troponin I (High Sensitivity) 02/27/2023 4  <18 ng/L Final   Comment: (NOTE) Elevated high sensitivity troponin I (hsTnI) values and significant  changes across serial measurements may suggest ACS but many other  chronic and acute conditions are known to elevate hsTnI results.  Refer to the "Links" section for chest pain algorithms and additional  guidance. Performed at [facility], [address] [place], [place] [address]    Lactic Acid, Venous 02/27/2023 1.8  0.5 - 1.9 mmol/L Final  No results displayed because visit has over 200 results.      Blood Alcohol level:  Lab Results  Component Value Date   ETH <10 06/07/2023   ETH <10 11/09/2017    Metabolic Disorder Labs: Lab Results  Component Value Date   HGBA1C 5.3 02/10/2023   MPG 105 02/10/2023   MPG 286.22 10/23/2017   No results found for: "PROLACTIN" Lab Results  Component Value Date   CHOL 176 10/23/2017   TRIG 193 (H) 10/23/2017   HDL 36 (L) 10/23/2017   CHOLHDL 4.9 10/23/2017   VLDL 39 10/23/2017   LDLCALC 101 (H)  10/23/2017    Therapeutic Lab Levels: No results found for: "LITHIUM" No results found for: "VALPROATE" No results found for: "CBMZ"  Physical Findings   AIMS    Flowsheet Row [facility] Admission (Discharged) from 10/22/2017 in [facility] INPATIENT BEHAVIORAL MEDICINE  AIMS Total Score 0 0      AUDIT    Flowsheet Row [facility]) [facility] Admission (Discharged) from 10/22/2017 in [facility]PAT[facility] BEHAV[facility]ICINE  Alcohol Use Disorder Identification Test Final Score (AUDIT) 7 13      PHQ2-9    Flowsheet Row ED from 06/07/2023 in [facility]  PHQ-2 Total Score 2  PHQ-9 Total Score 10      Flowsheet Row ED from 06/07/2023 in [facility] Most recent reading at 06/07/2023  6:12 PM ED from 06/07/2023 in [facility] Emergency Department at [facility] Most recent reading at 06/07/2023 12:37 AM ED from 02/27/2023 in [facility] Emergency Department at [facility] Most recent reading at 02/27/2023  2:09 PM  C-SSRS RISK CATEGORY No Risk No Risk No Risk        Psychiatric Specialty Exam: Physical Exam Constitutional:  Appearance: the patient is not toxic-appearing.  Pulmonary:     Effort: Pulmonary effort is normal.  Neurological:     General: No focal deficit present.     Mental Status: the patient is alert and oriented to person, place, and time.   Review of Systems  Respiratory:  Negative for shortness of breath.   Cardiovascular:  Negative for chest pain.  Gastrointestinal:  Negative for abdominal pain, constipation, diarrhea, nausea and vomiting.  Neurological:  Negative for headaches.      BP (!) 144/108 (BP Location: Right Arm) Comment: RN notified  Pulse 100   Temp 98.2 F (36.8 C) (Oral)   Resp 20   SpO2 100%   General Appearance: Fairly Groomed   Eye Contact:  Good  Speech:  Clear and Coherent  Volume:  Normal  Mood:  Euthymic  Affect:  Congruent  Thought Process:  Coherent  Orientation:  Full (Time, Place, and Person)  Thought Content: Logical   Suicidal Thoughts:  No  Homicidal Thoughts:  No  Memory:  Immediate;   Good  Judgement:  fair  Insight:  fair  Psychomotor Activity:  Normal  Concentration:  Concentration: Good  Recall:  Good  Fund of Knowledge: Good  Language: Good  Akathisia:  No  Handed:  not assessed  AIMS (if indicated): not done  Assets:  Communication Skills Desire for Improvement Financial Resources/Insurance Housing Leisure Time Physical Health  ADL's:  Intact  Cognition: WNL  Sleep:  Fair    Treatment Plan Summary: Daily contact with patient to assess and evaluate symptoms and progress in treatment and Medication management.  Opioid use and methamphetamine use - As needed medication available for symptomatic treatment - Social worker to coordinate outpatient or residential treatment   Carlyn Reichert, MD 06/08/2023 1:08 PM

## 2023-06-08 NOTE — ED Notes (Signed)
Patient in the bedroom calm and sleeping. NAD. Respirations are even and unlabored. Will monitor for safety.

## 2023-06-08 NOTE — ED Notes (Signed)
Patient A&Ox4. Denies intent to harm self/others when asked. Denies A/VH. Patient denies any physical complaints when asked. No acute distress noted. Ambulates well with walker provided by staff. Support and encouragement provided. Routine safety checks conducted according to facility protocol. Encouraged patient to notify staff if thoughts of harm toward self or others arise. Patient verbalize understanding and agreement. Will continue to monitor for safety.

## 2023-06-08 NOTE — Group Note (Signed)
Group Topic: Relapse and Recovery  Group Date: 06/08/2023 Start Time: 2000 End Time: 2100 Facilitators: Darin Engels  Department: [facility]  Number of Participants: 3  Group Focus: abuse issues, acceptance, and coping skills Treatment Modality:  Leisure Development Interventions utilized were leisure development Purpose: rehabilitation and coping   Name: Ronald Hampton Date of Birth: 08/16/1980  MR: [id number]    Level of Participation: pt did not attend group  Quality of Participation: pt did not attend group  Interactions with others: pt did not attend group  Mood/Affect: appropriate and positive Triggers (if applicable): n/a Cognition: coherent/clear Progress: Gaining insight Response: pt interacted with staff and other pts appropriately on the unit.  Plan: patient will be encouraged to continue attending group.   Patients Problems:  Patient Active Problem List   Diagnosis Date Noted   Lower extremity cellulitis 02/10/2023   Severe sepsis (HCC) 02/10/2023   Pressure injury of skin 06/04/2022   Fall 06/03/2022   Acute kidney injury superimposed on chronic kidney disease (HCC) 06/03/2022   COVID-19 virus infection 06/03/2022   Hypercalcemia 06/03/2022   Monoclonal gammopathy 06/03/2022   Constipation 06/03/2022   History of discitis 06/03/2022   History of bacteremia 06/03/2022   History of osteomyelitis 06/03/2022   Transaminitis 06/03/2022   Hyperbilirubinemia 06/03/2022   Normocytic anemia 06/03/2022   Depression 06/03/2022   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

## 2023-06-08 NOTE — ED Notes (Signed)
Patient is sleeping. Respirations equal and unlabored, skin warm and dry. No change in assessment or acuity. Routine safety checks conducted according to facility protocol. Will continue to monitor for safety.   

## 2023-06-08 NOTE — Group Note (Signed)
Group Topic: Wellness  Group Date: 06/08/2023 Start Time: 1515 End Time: 1527 Facilitators: Prentice Docker, RN  Department: [facility]  Number of Participants: 1  Group Focus: BP education Treatment Modality:  Individual Therapy Interventions utilized were patient education Purpose: increase insight  Name: Ronald Hampton Date of Birth: 26-Apr-1981  MR: [id number]    Level of Participation: active Quality of Participation: attentive and engaged Interactions with others: gave feedback Mood/Affect: positive Triggers (if applicable): none Cognition: coherent/clear Progress: Gaining insight Response: pt verbalized understanding of s/s to monitor for with elevated BP Plan: follow-up needed  Patients Problems:  Patient Active Problem List   Diagnosis Date Noted   Lower extremity cellulitis 02/10/2023   Severe sepsis (HCC) 02/10/2023   Pressure injury of skin 06/04/2022   Fall 06/03/2022   Acute kidney injury superimposed on chronic kidney disease (HCC) 06/03/2022   COVID-19 virus infection 06/03/2022   Hypercalcemia 06/03/2022   Monoclonal gammopathy 06/03/2022   Constipation 06/03/2022   History of discitis 06/03/2022   History of bacteremia 06/03/2022   History of osteomyelitis 06/03/2022   Transaminitis 06/03/2022   Hyperbilirubinemia 06/03/2022   Normocytic anemia 06/03/2022   Depression 06/03/2022   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

## 2023-06-09 DIAGNOSIS — F1510 Other stimulant abuse, uncomplicated: Secondary | ICD-10-CM | POA: Diagnosis not present

## 2023-06-09 MED ORDER — CLONIDINE HCL 0.1 MG PO TABS
0.1000 mg | ORAL_TABLET | Freq: Once | ORAL | Status: AC
  Administered 2023-06-09: 0.1 mg via ORAL
  Filled 2023-06-09: qty 1

## 2023-06-09 NOTE — ED Provider Notes (Signed)
Behavioral Health Progress Note  Date and Time: 06/09/2023 9:55 AM Name: Ronald Hampton MRN:  [id number]  Subjective:   The patient is a 42 year old male with a history of opioid and methamphetamine use.  He was admitted to the behavioral hospital 5 years ago for suicidal thoughts and was given a diagnosis of bipolar affective disorder 1.  He reports that he has disability income and lives in [place].  The patient has denied suicidal thoughts consistently since his arrival to the [facility] emergency department.  The patient was admitted from that facility to the facility based crisis for substance use treatment.  The patient desires to attend an intensive outpatient program in [place].  The patient has noted medical comorbidities of chronic kidney disease (GFR approximately 55), history of CVA, and history of hepatitis C.  He ambulates using a walker.  Patient also reports having multiple myeloma.  He denies experiencing any sick symptoms.  He denies taking any medications for his medical problems on a regular basis.  The patient reports that he is doing well this morning.  He denies having any questions.  The patient denies auditory/visual hallucinations.  The patient reports good mood, appetite, and sleep. They deny suicidal and homicidal thoughts. The patient denies side effects from their medications.  Review of systems as below. The patient denies experiencing any withdrawal symptoms.       Diagnosis:  Final diagnoses:  Methamphetamine abuse (HCC)    Total Time spent with patient: 15 minutes  Past Psychiatric History: See H&P and above Past Medical History: See H&P and above Family History: See H&P and above Family Psychiatric  History: See H&P and above Social History: See H&P and above  Additional Social History:     none                    Sleep: fair  Appetite:  fair  Current Medications:  Current Facility-Administered Medications  Medication  Dose Route Frequency Provider Last Rate Last Admin   acetaminophen (TYLENOL) tablet 650 mg  650 mg Oral Q6H PRN Weber, Kyra A, NP       alum & mag hydroxide-simeth (MAALOX/MYLANTA) 200-200-20 MG/5ML suspension 30 mL  30 mL Oral Q4H PRN Weber, Kyra A, NP       dicyclomine (BENTYL) tablet 20 mg  20 mg Oral Q6H PRN Carlyn Reichert, MD       hydrOXYzine (ATARAX) tablet 25 mg  25 mg Oral Q6H PRN Carlyn Reichert, MD       loperamide (IMODIUM) capsule 2-4 mg  2-4 mg Oral PRN Carlyn Reichert, MD       magnesium hydroxide (MILK OF MAGNESIA) suspension 30 mL  30 mL Oral Daily PRN Weber, Bella Kennedy A, NP       methocarbamol (ROBAXIN) tablet 500 mg  500 mg Oral Q8H PRN Carlyn Reichert, MD       naproxen (NAPROSYN) tablet 500 mg  500 mg Oral BID PRN Carlyn Reichert, MD   500 mg at 06/08/23 1520   Current Outpatient Medications  Medication Sig Dispense Refill   oxyCODONE (ROXICODONE) 5 MG immediate release tablet Take 1 tablet (5 mg total) by mouth every 6 (six) hours as needed. 15 tablet 0    Labs  Lab Results:  Admission on 06/07/2023, Discharged on 06/07/2023  Component Date Value Ref Range Status   Sodium 06/07/2023 135  135 - 145 mmol/L Final   Potassium 06/07/2023 3.8  3.5 - 5.1 mmol/L Final   Chloride  06/07/2023 104  98 - 111 mmol/L Final   CO2 06/07/2023 21 (L)  22 - 32 mmol/L Final   Glucose, Bld 06/07/2023 131 (H)  70 - 99 mg/dL Final   Glucose reference range applies only to samples taken after fasting for at least 8 hours.   BUN 06/07/2023 20  6 - 20 mg/dL Final   Creatinine, Ser 06/07/2023 1.55 (H)  0.61 - 1.24 mg/dL Final   Calcium 21/30/8657 9.3  8.9 - 10.3 mg/dL Final   Total Protein 84/69/6295 7.5  6.5 - 8.1 g/dL Final   Albumin 28/41/3244 3.5  3.5 - 5.0 g/dL Final   AST 07/18/7251 18  15 - 41 U/L Final   ALT 06/07/2023 12  0 - 44 U/L Final   Alkaline Phosphatase 06/07/2023 116  38 - 126 U/L Final   Total Bilirubin 06/07/2023 0.5  <1.2 mg/dL Final   GFR, Estimated 06/07/2023 57 (L)   >60 mL/min Final   Comment: (NOTE) Calculated using the CKD-EPI Creatinine Equation (2021)    Anion gap 06/07/2023 10  5 - 15 Final   Performed at [facility], [address] [place], [place] [address]   Alcohol, Ethyl (B) 06/07/2023 <10  <10 mg/dL Final   Comment: (NOTE) Lowest detectable limit for serum alcohol is 10 mg/dL.  For medical purposes only. Performed at [facility], [address] [place], [place] [address]    WBC 06/07/2023 8.0  4.0 - 10.5 K/uL Final   RBC 06/07/2023 4.48  4.22 - 5.81 MIL/uL Final   Hemoglobin 06/07/2023 12.4 (L)  13.0 - 17.0 g/dL Final   HCT 59/56/3875 37.2 (L)  39.0 - 52.0 % Final   MCV 06/07/2023 83.0  80.0 - 100.0 fL Final   MCH 06/07/2023 27.7  26.0 - 34.0 pg Final   MCHC 06/07/2023 33.3  30.0 - 36.0 g/dL Final   RDW 64/33/2951 14.7  11.5 - 15.5 % Final   Platelets 06/07/2023 278  150 - 400 K/uL Final   nRBC 06/07/2023 0.0  0.0 - 0.2 % Final   Performed at [facility], [address], [place], [place] [address]   Opiates 06/07/2023 NONE DETECTED  NONE DETECTED Final   Cocaine 06/07/2023 NONE DETECTED  NONE DETECTED Final   Benzodiazepines 06/07/2023 NONE DETECTED  NONE DETECTED Final   Amphetamines 06/07/2023 POSITIVE (A)  NONE DETECTED Final   Tetrahydrocannabinol 06/07/2023 NONE DETECTED  NONE DETECTED Final   Barbiturates 06/07/2023 NONE DETECTED  NONE DETECTED Final   Comment: (NOTE) DRUG SCREEN FOR MEDICAL PURPOSES ONLY.  IF CONFIRMATION IS NEEDED FOR ANY PURPOSE, NOTIFY LAB WITHIN 5 DAYS.  LOWEST DETECTABLE LIMITS FOR URINE DRUG SCREEN Drug Class                     Cutoff (ng/mL) Amphetamine and metabolites    1000 Barbiturate and metabolites    200 Benzodiazepine                 200 Opiates and metabolites        300 Cocaine and metabolites        300 THC                            50 Performed at [facility], [address] [place], [place] [address]   Admission on 02/27/2023, Discharged on  02/27/2023  Component Date Value Ref Range Status  Sodium 02/27/2023 135  135 - 145 mmol/L Final   Potassium 02/27/2023 4.1  3.5 - 5.1 mmol/L Final   Chloride 02/27/2023 100  98 - 111 mmol/L Final   CO2 02/27/2023 25  22 - 32 mmol/L Final   Glucose, Bld 02/27/2023 118 (H)  70 - 99 mg/dL Final   Glucose reference range applies only to samples taken after fasting for at least 8 hours.   BUN 02/27/2023 21 (H)  6 - 20 mg/dL Final   Creatinine, Ser 02/27/2023 1.72 (H)  0.61 - 1.24 mg/dL Final   Calcium 16/04/9603 9.1  8.9 - 10.3 mg/dL Final   GFR, Estimated 02/27/2023 51 (L)  >60 mL/min Final   Comment: (NOTE) Calculated using the CKD-EPI Creatinine Equation (2021)    Anion gap 02/27/2023 10  5 - 15 Final   Performed at [facility], [address], [place], [place] [address]   WBC 02/27/2023 7.3  4.0 - 10.5 K/uL Final   RBC 02/27/2023 3.52 (L)  4.22 - 5.81 MIL/uL Final   Hemoglobin 02/27/2023 10.2 (L)  13.0 - 17.0 g/dL Final   HCT 11/91/4782 30.9 (L)  39.0 - 52.0 % Final   MCV 02/27/2023 87.8  80.0 - 100.0 fL Final   MCH 02/27/2023 29.0  26.0 - 34.0 pg Final   MCHC 02/27/2023 33.0  30.0 - 36.0 g/dL Final   RDW 95/62/1308 13.2  11.5 - 15.5 % Final   Platelets 02/27/2023 360  150 - 400 K/uL Final   nRBC 02/27/2023 0.0  0.0 - 0.2 % Final   Performed at [facility], [address], [place], [place] [address]   Troponin I (High Sensitivity) 02/27/2023 4  <18 ng/L Final   Comment: (NOTE) Elevated high sensitivity troponin I (hsTnI) values and significant  changes across serial measurements may suggest ACS but many other  chronic and acute conditions are known to elevate hsTnI results.  Refer to the "Links" section for chest pain algorithms and additional  guidance. Performed at [facility], [address] [place], [place] [address]    Lactic Acid, Venous 02/27/2023 1.8  0.5 - 1.9 mmol/L Final  No results displayed because visit has over 200 results.      Blood  Alcohol level:  Lab Results  Component Value Date   ETH <10 06/07/2023   ETH <10 11/09/2017    Metabolic Disorder Labs: Lab Results  Component Value Date   HGBA1C 5.3 02/10/2023   MPG 105 02/10/2023   MPG 286.22 10/23/2017   No results found for: "PROLACTIN" Lab Results  Component Value Date   CHOL 176 10/23/2017   TRIG 193 (H) 10/23/2017   HDL 36 (L) 10/23/2017   CHOLHDL 4.9 10/23/2017   VLDL 39 10/23/2017   LDLCALC 101 (H) 10/23/2017    Therapeutic Lab Levels: No results found for: "LITHIUM" No results found for: "VALPROATE" No results found for: "CBMZ"  Physical Findings   AIMS    Flowsheet Row [facility] Ad[facility] (Discharged) from 10/22/2017 in [facility]  AIMS Total Score 0 0      AUDIT    Flowsheet Row [facility]) [facility] Admission (Discharged) from 10/22/2017 in [facility] INPATIENT BEHAVIORAL MEDICINE  Alcohol Use Disorder Identification Test Final Score (AUDIT) 7 13      PHQ2-9    Flowsheet Row ED from 06/07/2023 in [facility]  PHQ-2 Total Score 2  PHQ-9 Total Score 10      Flowsheet Row ED from 06/07/2023 in [facility] Most recent reading at 06/07/2023  6:12 PM ED from 06/07/2023 in [facility] Emergency Department at [facility] Most recent reading at 06/07/2023 12:37 AM ED from 02/27/2023 in [facility] Emergency Department at [facility] Most recent reading at 02/27/2023  2:09 PM  C-SSRS RISK CATEGORY No Risk No Risk No Risk        Psychiatric Specialty Exam: Physical Exam Constitutional:      Appearance: the patient is not toxic-appearing.  Pulmonary:     Effort: Pulmonary effort is normal.  Neurological:     General: No focal deficit present.     Mental Status: the patient is alert and oriented to  person, place, and time.   Review of Systems  Respiratory:  Negative for shortness of breath.   Cardiovascular:  Negative for chest pain.  Gastrointestinal:  Negative for abdominal pain, constipation, diarrhea, nausea and vomiting.  Neurological:  Negative for headaches.      BP (!) 142/88 (BP Location: Right Arm) Comment: notified RN  Pulse 98   Temp 98.4 F (36.9 C) (Oral)   Resp 18   SpO2 100%   General Appearance: Fairly Groomed  Eye Contact:  Good  Speech:  Clear and Coherent  Volume:  Normal  Mood:  Euthymic  Affect:  Congruent  Thought Process:  Coherent  Orientation:  Full (Time, Place, and Person)  Thought Content: Logical   Suicidal Thoughts:  No  Homicidal Thoughts:  No  Memory:  Immediate;   Good  Judgement:  fair  Insight:  fair  Psychomotor Activity:  Normal  Concentration:  Concentration: Good  Recall:  Good  Fund of Knowledge: Good  Language: Good  Akathisia:  No  Handed:  not assessed  AIMS (if indicated): not done  Assets:  Communication Skills Desire for Improvement Financial Resources/Insurance Housing Leisure Time Physical Health  ADL's:  Intact  Cognition: WNL  Sleep:  Fair    Treatment Plan Summary: Daily contact with patient to assess and evaluate symptoms and progress in treatment and Medication management.  Opioid use and methamphetamine use - As needed medication available for symptomatic treatment - Social worker to coordinate outpatient or residential treatment   Carlyn Reichert, MD 06/09/2023 9:55 AM

## 2023-06-09 NOTE — ED Notes (Addendum)
Patient A&Ox4. Denies intent to harm self/others when asked. Denies A/VH. Patient denies any physical complaints when asked. Pt denies any withdrawal sx at present. Reports sleeping good last night. No acute distress noted. Routine safety checks conducted according to facility protocol. Encouraged patient to notify staff if thoughts of harm toward self or others arise. Patient verbalize understanding and agreement. Will continue to monitor for safety.

## 2023-06-09 NOTE — ED Notes (Signed)
Patient in the bedroom calm and sleeping. NAD. Respirations are even and unlabored. Will monitor for safety.

## 2023-06-09 NOTE — ED Notes (Signed)
Patient is sleeping. Respirations equal and unlabored, skin warm and dry. No change in assessment or acuity. Routine safety checks conducted according to facility protocol. Will continue to monitor for safety.   

## 2023-06-09 NOTE — ED Notes (Signed)
Rechecked pt's BP 142/108. Provider notified with instructions given to continue monitoring BP and to notify provider with any changes. Will continue to monitor for safety.

## 2023-06-09 NOTE — ED Notes (Signed)
Pt

## 2023-06-09 NOTE — ED Notes (Signed)
Pt sleeping in no acute distress. RR even and unlabored. Environment secured. Will continue to monitor for safety. 

## 2023-06-09 NOTE — Group Note (Signed)
Group Topic: Relapse and Recovery  Group Date: 06/09/2023 Start Time: 1700 End Time: 1725 Facilitators: Prentice Docker, RN  Department: [facility]  Number of Participants: 4  Group Focus: relapse prevention Treatment Modality:  Leisure Development Interventions utilized were problem solving Purpose: relapse prevention strategies  Name: Ronald Hampton Date of Birth: April 18, 1981  MR: [id number]    Level of Participation: active Quality of Participation: cooperative and motivated Interactions with others: gave feedback Mood/Affect: appropriate and positive Triggers (if applicable): none identified Cognition: coherent/clear and logical Progress: Gaining insight Response: "I plan to go to more NA meetings and find myself a sponsor. I need someone to be there when I have those cravings, so I'll get me a good sponsor" Plan: patient will be encouraged to follow thru on his plan of action to prevent relapse  Patients Problems:  Patient Active Problem List   Diagnosis Date Noted   Lower extremity cellulitis 02/10/2023   Severe sepsis (HCC) 02/10/2023   Pressure injury of skin 06/04/2022   Fall 06/03/2022   Acute kidney injury superimposed on chronic kidney disease (HCC) 06/03/2022   COVID-19 virus infection 06/03/2022   Hypercalcemia 06/03/2022   Monoclonal gammopathy 06/03/2022   Constipation 06/03/2022   History of discitis 06/03/2022   History of bacteremia 06/03/2022   History of osteomyelitis 06/03/2022   Transaminitis 06/03/2022   Hyperbilirubinemia 06/03/2022   Normocytic anemia 06/03/2022   Depression 06/03/2022   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

## 2023-06-09 NOTE — Group Note (Unsigned)
Group Topic: Relaxation  Group Date: 06/09/2023 Start Time: 0730 End Time: 0800 Facilitators: Hilma Favors, RN  Department: [facility]  Number of Participants: 4  Group Focus: daily focus Treatment Modality:  Leisure Development Interventions utilized were exploration Purpose: reinforce self-care  Name: Ronald Hampton Date of Birth: [birth year]  MR: [id number]    Level of Participation: did not attend Quality of Participation: n/a Interactions with others: n/a Mood/Affect: n/a Triggers (if applicable): n/a Cognition: n/a Progress: None Response: n/a Plan: patient will be encouraged to attend groups  Patients Problems:  Patient Active Problem List   Diagnosis Date Noted   Lower extremity cellulitis 02/10/2023   Severe sepsis (HCC) 02/10/2023   Pressure injury of skin 06/04/2022   Fall 06/03/2022   Acute kidney injury superimposed on chronic kidney disease (HCC) 06/03/2022   COVID-19 virus infection 06/03/2022   Hypercalcemia 06/03/2022   Monoclonal gammopathy 06/03/2022   Constipation 06/03/2022   History of discitis 06/03/2022   History of bacteremia 06/03/2022   History of osteomyelitis 06/03/2022   Transaminitis 06/03/2022   Hyperbilirubinemia 06/03/2022   Normocytic anemia 06/03/2022   Depression 06/03/2022   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

## 2023-06-09 NOTE — ED Notes (Signed)
Notified Veronique, NP, of pt's elevated BP (152/116) manually. Orders given for Clonidine 0.1mg . Pt asymptomatic. Encouraged pt to eat foods low in sodium, s/s to monitor for with elevated BP. Pt verbalized understanding and agreement. Pt received medication without difficulty. Will continue to to monitor.

## 2023-06-09 NOTE — ED Notes (Signed)
Pt sleeping@this time breathing even and unlabored will continue to monitor for safety 

## 2023-06-09 NOTE — Group Note (Signed)
Group Topic: Balance in Life  Group Date: 06/09/2023 Start Time: 1730 End Time: 1805 Facilitators: Vonzell Schlatter B  Department: [facility]  Number of Participants: 4  Group Focus: activities of daily living skills and daily focus Treatment Modality:  Psychoeducation Interventions utilized were patient education and support Purpose: express feelings and regain self-worth  Name: Ronald Hampton Date of Birth: 1981/04/07  MR: [id number]    Level of Participation: moderate Quality of Participation: attentive and cooperative Interactions with others: gave feedback Mood/Affect: positive Triggers (if applicable): n/a Cognition: coherent/clear Progress: Moderate Response: pt is trying not to sleep as much and he doesn't know why he is so tired Plan: follow-up needed  Patients Problems:  Patient Active Problem List   Diagnosis Date Noted   Lower extremity cellulitis 02/10/2023   Severe sepsis (HCC) 02/10/2023   Pressure injury of skin 06/04/2022   Fall 06/03/2022   Acute kidney injury superimposed on chronic kidney disease (HCC) 06/03/2022   COVID-19 virus infection 06/03/2022   Hypercalcemia 06/03/2022   Monoclonal gammopathy 06/03/2022   Constipation 06/03/2022   History of discitis 06/03/2022   History of bacteremia 06/03/2022   History of osteomyelitis 06/03/2022   Transaminitis 06/03/2022   Hyperbilirubinemia 06/03/2022   Normocytic anemia 06/03/2022   Depression 06/03/2022   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

## 2023-06-09 NOTE — ED Notes (Signed)
Pt in room sleeping calm and cooperative no c/o pain or distress will continue to monitor for safety

## 2023-06-10 DIAGNOSIS — F1510 Other stimulant abuse, uncomplicated: Secondary | ICD-10-CM | POA: Diagnosis not present

## 2023-06-10 LAB — RAPID HIV SCREEN (HIV 1/2 AB+AG)
HIV 1/2 Antibodies: NONREACTIVE
HIV-1 P24 Antigen - HIV24: NONREACTIVE

## 2023-06-10 LAB — HEPATITIS PANEL, ACUTE
HCV Ab: REACTIVE — AB
Hep A IgM: NONREACTIVE
Hep B C IgM: NONREACTIVE
Hepatitis B Surface Ag: NONREACTIVE

## 2023-06-10 MED ORDER — CLONIDINE HCL 0.1 MG PO TABS: 0.1000 mg | ORAL_TABLET | Freq: Four times a day (QID) | ORAL | Status: DC | PRN

## 2023-06-10 MED ORDER — AMLODIPINE BESYLATE 5 MG PO TABS
5.0000 mg | ORAL_TABLET | Freq: Every day | ORAL | Status: DC
  Administered 2023-06-10 – 2023-06-12 (×3): 5 mg via ORAL
  Filled 2023-06-10 (×4): qty 1

## 2023-06-10 NOTE — ED Notes (Signed)
 [facility] called for specimen pickup

## 2023-06-10 NOTE — Group Note (Signed)
Group Topic: Relapse and Recovery  Group Date: 06/10/2023 Start Time: 0730 End Time: 0800 Facilitators: Quinn Axe, NT  Department: [facility]  Number of Participants: 4  Group Focus: clarity of thought, communication, concentration, coping skills, goals/reality orientation, relapse prevention, and self-awareness Treatment Modality:  Cognitive Behavioral Therapy, Patient-Centered Therapy, and Solution-Focused Therapy Interventions utilized were problem solving Purpose: enhance coping skills, increase insight, relapse prevention strategies, and trigger / craving management  Name: Ronald Hampton Date of Birth: 1980-12-29  MR: [id number]    Level of Participation: did not attend group Quality of Participation: n/a Interactions with others: n/a Mood/Affect: n/a Triggers (if applicable): n/a Cognition: n/a Progress: Other Response: n/a Plan: patient will be encouraged to attend all scheduled programming.  Patients Problems:  Patient Active Problem List   Diagnosis Date Noted   Lower extremity cellulitis 02/10/2023   Severe sepsis (HCC) 02/10/2023   Pressure injury of skin 06/04/2022   Fall 06/03/2022   Acute kidney injury superimposed on chronic kidney disease (HCC) 06/03/2022   COVID-19 virus infection 06/03/2022   Hypercalcemia 06/03/2022   Monoclonal gammopathy 06/03/2022   Constipation 06/03/2022   History of discitis 06/03/2022   History of bacteremia 06/03/2022   History of osteomyelitis 06/03/2022   Transaminitis 06/03/2022   Hyperbilirubinemia 06/03/2022   Normocytic anemia 06/03/2022   Depression 06/03/2022   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

## 2023-06-10 NOTE — ED Notes (Signed)
Pt sitting up on bed preparing for blood draw. No acute distress noted. Cooperative with staff. No concerns voiced. Informed pt to notify staff with any needs or assistance. Pt verbalized understanding or agreement. Will continue to monitor for safety.

## 2023-06-10 NOTE — ED Notes (Signed)
Pt sleeping@this time breathing even and unlabored will continue to monitor for safety 

## 2023-06-10 NOTE — ED Notes (Signed)
Patient provided lunch.

## 2023-06-10 NOTE — ED Provider Notes (Signed)
Behavioral Health Progress Note  Date and Time: 06/10/2023 9:08 AM Name: Ronald Hampton MRN:  [id number]  Subjective:   The patient is a 42 year old male with a history of opioid and methamphetamine use.  He was admitted to the behavioral hospital 5 years ago for suicidal thoughts and was given a diagnosis of bipolar affective disorder 1.  He reports that he has disability income and lives in [place].  The patient has denied suicidal thoughts consistently since his arrival to the [facility] emergency department.  The patient was admitted from that facility to the facility based crisis for substance use treatment.  The patient desires to attend an intensive outpatient program in [place].  The patient has noted medical comorbidities of chronic kidney disease (GFR approximately 55), history of CVA, and history of hepatitis C.  He ambulates using a walker.  Patient also reports having multiple myeloma.  He denies experiencing any sick symptoms.  He denies taking any medications for his medical problems on a regular basis.  The patient reports he has been to multiple inpatient rehabs including [facility] [address] and [facility]. He reports that he wants to get on suboxone and follow-up with an intensive outpatient program. He reports prior use of up to 1g heroin per day (including IV use) and history of unintentional overdose though chart review shows intentional overdose by heroin. He reports he was not taking any medications for medical conditions prior to arrival including any for HTN. He reports he was treated for hepatitis C in prison this past year.   The patient denies auditory/visual hallucinations.  The patient reports fair mood, appetite, and sleep. They deny suicidal and homicidal thoughts. The patient denies side effects from their medications.  Review of systems as below. The patient denies experiencing any withdrawal symptoms.    Diagnosis:  Final diagnoses:   Methamphetamine abuse (HCC)    Total Time spent with patient: 15 minutes  Past Psychiatric History: Admitted to the behavioral hospital 5 years ago for suicidal thoughts and was given a diagnosis of bipolar affective disorder 1. History of suicide attempt by heroin overdose. Has previously been on psychotropic medications including depakote, doxepin, abilify maintenna, abilify, prazosin Past Medical History: Chronic kidney disease (GFR approximately 55), history of CVA, and history of hepatitis C (reports treated).  He ambulates using a walker.  Patient also reports having multiple myeloma Family History: Unknown Social History: lives in [place] with mom, 2 brothers, and sister. On disability. [facility] of [facility]   Additional Social History:     none                    Sleep: fair  Appetite:  fair  Current Medications:  Current Facility-Administered Medications  Medication Dose Route Frequency Provider Last Rate Last Admin   acetaminophen (TYLENOL) tablet 650 mg  650 mg Oral Q6H PRN Weber, Kyra A, NP       alum & mag hydroxide-simeth (MAALOX/MYLANTA) 200-200-20 MG/5ML suspension 30 mL  30 mL Oral Q4H PRN Weber, Kyra A, NP       dicyclomine (BENTYL) tablet 20 mg  20 mg Oral Q6H PRN Carlyn Reichert, MD       hydrOXYzine (ATARAX) tablet 25 mg  25 mg Oral Q6H PRN Carlyn Reichert, MD       loperamide (IMODIUM) capsule 2-4 mg  2-4 mg Oral PRN Carlyn Reichert, MD       magnesium hydroxide (MILK OF MAGNESIA) suspension  30 mL  30 mL Oral Daily PRN Weber, Bella Kennedy A, NP       methocarbamol (ROBAXIN) tablet 500 mg  500 mg Oral Q8H PRN Carlyn Reichert, MD       naproxen (NAPROSYN) tablet 500 mg  500 mg Oral BID PRN Carlyn Reichert, MD   500 mg at 06/08/23 1520   Current Outpatient Medications  Medication Sig Dispense Refill   oxyCODONE (ROXICODONE) 5 MG immediate release tablet Take 1 tablet (5 mg total) by mouth every 6 (six) hours as needed.  15 tablet 0    Labs  Lab Results:  Admission on 06/07/2023, Discharged on 06/07/2023  Component Date Value Ref Range Status   Sodium 06/07/2023 135  135 - 145 mmol/L Final   Potassium 06/07/2023 3.8  3.5 - 5.1 mmol/L Final   Chloride 06/07/2023 104  98 - 111 mmol/L Final   CO2 06/07/2023 21 (L)  22 - 32 mmol/L Final   Glucose, Bld 06/07/2023 131 (H)  70 - 99 mg/dL Final   Glucose reference range applies only to samples taken after fasting for at least 8 hours.   BUN 06/07/2023 20  6 - 20 mg/dL Final   Creatinine, Ser 06/07/2023 1.55 (H)  0.61 - 1.24 mg/dL Final   Calcium 16/04/9603 9.3  8.9 - 10.3 mg/dL Final   Total Protein 54/03/8118 7.5  6.5 - 8.1 g/dL Final   Albumin 14/78/2956 3.5  3.5 - 5.0 g/dL Final   AST 21/30/8657 18  15 - 41 U/L Final   ALT 06/07/2023 12  0 - 44 U/L Final   Alkaline Phosphatase 06/07/2023 116  38 - 126 U/L Final   Total Bilirubin 06/07/2023 0.5  <1.2 mg/dL Final   GFR, Estimated 06/07/2023 57 (L)  >60 mL/min Final   Comment: (NOTE) Calculated using the CKD-EPI Creatinine Equation (2021)    Anion gap 06/07/2023 10  5 - 15 Final   Performed at [facility], [address] [place], [place] [address]   Alcohol, Ethyl (B) 06/07/2023 <10  <10 mg/dL Final   Comment: (NOTE) Lowest detectable limit for serum alcohol is 10 mg/dL.  For medical purposes only. Performed at [facility], [address], [place], [place] [address]    WBC 06/07/2023 8.0  4.0 - 10.5 K/uL Final   RBC 06/07/2023 4.48  4.22 - 5.81 MIL/uL Final   Hemoglobin 06/07/2023 12.4 (L)  13.0 - 17.0 g/dL Final   HCT 41/32/4401 37.2 (L)  39.0 - 52.0 % Final   MCV 06/07/2023 83.0  80.0 - 100.0 fL Final   MCH 06/07/2023 27.7  26.0 - 34.0 pg Final   MCHC 06/07/2023 33.3  30.0 - 36.0 g/dL Final   RDW 02/72/5366 14.7  11.5 - 15.5 % Final   Platelets 06/07/2023 278  150 - 400 K/uL Final   nRBC 06/07/2023 0.0  0.0 - 0.2 % Final   Performed at [facility], [address]  [place], [place] [address]   Opiates 06/07/2023 NONE DETECTED  NONE DETECTED Final   Cocaine 06/07/2023 NONE DETECTED  NONE DETECTED Final   Benzodiazepines 06/07/2023 NONE DETECTED  NONE DETECTED Final   Amphetamines 06/07/2023 POSITIVE (A)  NONE DETECTED Final   Tetrahydrocannabinol 06/07/2023 NONE DETECTED  NONE DETECTED Final   Barbiturates 06/07/2023 NONE DETECTED  NONE DETECTED Final   Comment: (NOTE) DRUG SCREEN FOR MEDICAL PURPOSES ONLY.  IF CONFIRMATION IS NEEDED FOR ANY PURPOSE, NOTIFY LAB WITHIN 5 DAYS.  LOWEST DETECTABLE  LIMITS FOR URINE DRUG SCREEN Drug Class                     Cutoff (ng/mL) Amphetamine and metabolites    1000 Barbiturate and metabolites    200 Benzodiazepine                 200 Opiates and metabolites        300 Cocaine and metabolites        300 THC                            50 Performed at [facility], [address] [place], [place] [address]   Admission on 02/27/2023, Discharged on 02/27/2023  Component Date Value Ref Range Status   Sodium 02/27/2023 135  135 - 145 mmol/L Final   Potassium 02/27/2023 4.1  3.5 - 5.1 mmol/L Final   Chloride 02/27/2023 100  98 - 111 mmol/L Final   CO2 02/27/2023 25  22 - 32 mmol/L Final   Glucose, Bld 02/27/2023 118 (H)  70 - 99 mg/dL Final   Glucose reference range applies only to samples taken after fasting for at least 8 hours.   BUN 02/27/2023 21 (H)  6 - 20 mg/dL Final   Creatinine, Ser 02/27/2023 1.72 (H)  0.61 - 1.24 mg/dL Final   Calcium 19/14/7829 9.1  8.9 - 10.3 mg/dL Final   GFR, Estimated 02/27/2023 51 (L)  >60 mL/min Final   Comment: (NOTE) Calculated using the CKD-EPI Creatinine Equation (2021)    Anion gap 02/27/2023 10  5 - 15 Final   Performed at [facility], [address], [place], [place] [address]   WBC 02/27/2023 7.3  4.0 - 10.5 K/uL Final   RBC 02/27/2023 3.52 (L)  4.22 - 5.81 MIL/uL Final   Hemoglobin 02/27/2023 10.2 (L)  13.0 - 17.0 g/dL Final   HCT 08/65/7846 30.9 (L)   39.0 - 52.0 % Final   MCV 02/27/2023 87.8  80.0 - 100.0 fL Final   MCH 02/27/2023 29.0  26.0 - 34.0 pg Final   MCHC 02/27/2023 33.0  30.0 - 36.0 g/dL Final   RDW 96/29/5284 13.2  11.5 - 15.5 % Final   Platelets 02/27/2023 360  150 - 400 K/uL Final   nRBC 02/27/2023 0.0  0.0 - 0.2 % Final   Performed at [facility], [address], [place], [place] [address]   Troponin I (High Sensitivity) 02/27/2023 4  <18 ng/L Final   Comment: (NOTE) Elevated high sensitivity troponin I (hsTnI) values and significant  changes across serial measurements may suggest ACS but many other  chronic and acute conditions are known to elevate hsTnI results.  Refer to the "Links" section for chest pain algorithms and additional  guidance. Performed at [facility], [address] [place], [place] [address]    Lactic Acid, Venous 02/27/2023 1.8  0.5 - 1.9 mmol/L Final  No results displayed because visit has over 200 results.      Blood Alcohol level:  Lab Results  Component Value Date   [facility] <10 06/07/2023   ETH <10 11/09/2017    Metabolic Disorder Labs: Lab Results  Component Value Date   HGBA1C 5.3 02/10/2023   MPG 105 02/10/2023   MPG 286.22 10/23/2017   No results found for: "PROLACTIN" Lab Results  Component Value Date   CHOL 176 10/23/2017   TRIG 193 (  H) 10/23/2017   HDL 36 (L) 10/23/2017   CHOLHDL 4.9 10/23/2017   VLDL 39 10/23/2017   LDLCALC 101 (H) 10/23/2017    Therapeutic Lab Levels: No results found for: "LITHIUM" No results found for: "VALPROATE" No results found for: "CBMZ"  Physical Findings   AIMS    Flowsheet Row [facility] Admission (Discharged) from 10/22/2017 in [facility]  AIMS Total Score 0 0      AUDIT    Flowsheet Row [facility]) [facility] Admission (Discharged) from 10/22/2017 in [facility]  INPATIENT BEHAVIORAL MEDICINE  Alcohol Use Disorder Identification Test Final Score (AUDIT) 7 13      PHQ2-9    Flowsheet Row ED from 06/07/2023 in [facility]  PHQ-2 Total Score 2  PHQ-9 Total Score 10      Flowsheet Row ED from 06/07/2023 in [facility] Most recent reading at 06/07/2023  6:12 PM ED from 06/07/2023 in [facility] Emergency Department at [facility] Most recent reading at 06/07/2023 12:37 AM ED from 02/27/2023 in [facility] Department at [facility] Most recent reading at 02/27/2023  2:09 PM  C-SSRS RISK CATEGORY No Risk No Risk No Risk        Psychiatric Specialty Exam: Physical Exam Constitutional:      Appearance: the patient is not toxic-appearing.  Pulmonary:     Effort: Pulmonary effort is normal.  Neurological:     General: No focal deficit present.     Mental Status: the patient is alert and oriented to person, place, and time.   Review of Systems  Respiratory:  Negative for shortness of breath.   Cardiovascular:  Negative for chest pain.  Gastrointestinal:  Negative for abdominal pain, constipation, diarrhea, nausea and vomiting.  Neurological:  Negative for headaches.      BP (!) 140/96 (BP Location: Left Arm)   Pulse (!) 107   Temp 98.3 F (36.8 C) (Oral)   Resp 19   SpO2 99%   General Appearance: Fairly Groomed  Eye Contact:  Good  Speech:  Clear and Coherent  Volume:  Normal  Mood:  Euthymic  Affect:  Congruent  Thought Process:  Coherent  Orientation:  Full (Time, Place, and Person)  Thought Content: Logical   Suicidal Thoughts:  No  Homicidal Thoughts:  No  Memory:  Immediate;   Good  Judgement:  fair  Insight:  fair  Psychomotor Activity:  Normal  Concentration:  Concentration: Good  Recall:  Good  Fund of Knowledge: Good  Language: Good  Akathisia:  No  Handed:  not assessed  AIMS (if indicated): not done  Assets:  Communication  Skills Desire for Improvement Financial Resources/Insurance Housing Leisure Time Physical Health  ADL's:  Intact  Cognition: WNL  Sleep:  Fair    Treatment Plan Summary: Daily contact with patient to assess and evaluate symptoms and progress in treatment and Medication management.  The patient is a 42 year old male with a past psychiatric history of opioid and methamphetamine use, [facility] admission 5 years ago for suicidal thoughts, history of intentional overdose on heroin as suicide attempt and past medical history of chronic kidney disease (GFR approximately 36), history of CVA, history of hepatitis C, HTN who presents for opioid and stimulant use detox.   Patient currently denying any withdrawal symptoms, meets criteria for opioid use disorder, stimulant use disorder. Could  benefit from starting suboxone in the outpatient setting. Desires to go to intensive outpatient program for rehab.   Labs notable for Cr 1.55, AST 18, ALT 12, Hgb 12.4. Qtc 462. Alc<10.   #Opioid use disorder #Stimulant use disorder - As needed medication available for symptomatic treatment  #HTN History of HTN on lisinopril. Given CKD, will start amlodipine for HTN.  -start amlodipine 5mg  for HTN -PRN clonidine   #CKD IIIa Around baseline.   #Recent IVDU  #History of hepatitis C -repeat hepatitis panel, HIV, RPR, G/C -treated for hepatitis C in the past, will need outpatient follow-up with ID   Dispo: Patient desires to go to intensive outpatient program in [place] [facility]    Karie Fetch, MD, PGY-2 06/10/2023 9:08 AM

## 2023-06-10 NOTE — ED Notes (Signed)
Pt BP flagged in system 120/98. Provider notified but no new orders due to BP not meeting required parameter for prn sx mgmt. Pt asymptomatic and resting in no acute distress. Informed pt to notify staff with any needs, discomfort or distress. Pt verbalized understanding and agreement. Will continue to monitor for safety.

## 2023-06-10 NOTE — Group Note (Signed)
Group Topic: Relapse and Recovery  Group Date: 06/10/2023 Start Time: 1004 End Time: 1100 Facilitators: Naz Denunzio, Jacklynn Barnacle, RN  Department: [facility]  Number of Participants: 1  Group Focus: abuse issues, acceptance, chemical dependency education, chemical dependency issues, communication, community group, individual meeting, relapse prevention, and substance abuse education Treatment Modality:  Individual Therapy Interventions utilized were discussion, education Purpose: enhance coping skills, express feelings, increase insight, and relapse prevention strategies  Name: Ronald Hampton Date of Birth: 07-Nov-1980  MR: [id number]    Level of Participation: did not participate in group Quality of Participation: did not participate in group Interactions with others: did not participate in group Mood/Affect: did not participate in group Triggers (if applicable): did not participate in group Cognition: did not participate in group Progress: None Response: did not participate in group Plan: patient will be encouraged to participate in future groups  Patients Problems:  Patient Active Problem List   Diagnosis Date Noted   Lower extremity cellulitis 02/10/2023   Severe sepsis (HCC) 02/10/2023   Pressure injury of skin 06/04/2022   Fall 06/03/2022   Acute kidney injury superimposed on chronic kidney disease (HCC) 06/03/2022   COVID-19 virus infection 06/03/2022   Hypercalcemia 06/03/2022   Monoclonal gammopathy 06/03/2022   Constipation 06/03/2022   History of discitis 06/03/2022   History of bacteremia 06/03/2022   History of osteomyelitis 06/03/2022   Transaminitis 06/03/2022   Hyperbilirubinemia 06/03/2022   Normocytic anemia 06/03/2022   Depression 06/03/2022   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

## 2023-06-10 NOTE — ED Notes (Signed)
Abnormal BP of 134/96 manual obtained. Nicolette Bang, RN notified.

## 2023-06-10 NOTE — ED Notes (Signed)
Writer attempted to draw labs. Blood spurted from site upon stick but no blood drew into tubes. Another staff member was able to draw labs with no complications. Patient tolerated well.

## 2023-06-10 NOTE — ED Notes (Signed)
Patient is sleeping. Respirations equal and unlabored, skin warm and dry. No change in assessment or acuity. Routine safety checks conducted according to facility protocol. Will continue to monitor for safety.   

## 2023-06-10 NOTE — Group Note (Signed)
Group Topic: Change and Accountability  Group Date: 06/10/2023 Start Time: 1710 End Time: 1730 Facilitators: Prentice Docker, RN  Department: [facility]  Number of Participants: 4  Group Focus: coping skills Treatment Modality:  Leisure Development Interventions utilized were problem solving Purpose: enhance coping skills  Name: Ronald Hampton Date of Birth: [birth year]  MR: [id number]    Level of Participation: active Quality of Participation: attentive and cooperative Interactions with others: gave feedback Mood/Affect: appropriate Triggers (if applicable): none identified Cognition: insightful Progress: Gaining insight Response: "I will just walk and breath when I start having cravings" Plan: patient will be encouraged to continue learned coping skills utilized to manage cravings  Patients Problems:  Patient Active Problem List   Diagnosis Date Noted   Lower extremity cellulitis 02/10/2023   Severe sepsis (HCC) 02/10/2023   Pressure injury of skin 06/04/2022   Fall 06/03/2022   Acute kidney injury superimposed on chronic kidney disease (HCC) 06/03/2022   COVID-19 virus infection 06/03/2022   Hypercalcemia 06/03/2022   Monoclonal gammopathy 06/03/2022   Constipation 06/03/2022   History of discitis 06/03/2022   History of bacteremia 06/03/2022   History of osteomyelitis 06/03/2022   Transaminitis 06/03/2022   Hyperbilirubinemia 06/03/2022   Normocytic anemia 06/03/2022   Depression 06/03/2022   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

## 2023-06-10 NOTE — ED Notes (Signed)
Pt is in his room sleeping. Pt denies SI/HI/AVH. No acute distress noted. Will continue to monitor for safety and provide support.

## 2023-06-10 NOTE — Group Note (Unsigned)
Group Topic: Relapse and Recovery  Group Date: 06/10/2023 Start Time: 0730 End Time: 0800 Facilitators: Quinn Axe, NT  Department: [facility]  Number of Participants: 4  Group Focus: clarity of thought, communication, coping skills, problem solving, relapse prevention, and self-awareness Treatment Modality:  Cognitive Behavioral Therapy and Patient-Centered Therapy Interventions utilized were clarification, confrontation, patient education, and problem solving Purpose: enhance coping skills, relapse prevention strategies, and trigger / craving management   Name: Ronald Hampton Date of Birth: 06/09/1981  MR: [id number]    Level of Participation: {THERAPIES; PSYCH GROUP PARTICIPATION [id number]:19147} Quality of Participation: {THERAPIES; PSYCH QUALITY OF PARTICIPATION:23992} Interactions with others: {THERAPIES; PSYCH INTERACTIONS:23993} Mood/Affect: {THERAPIES; PSYCH MOOD/AFFECT:23994} Triggers (if applicable): *** Cognition: {THERAPIES; PSYCH COGNITION:23995} Progress: {THERAPIES; PSYCH PROGRESS:23997} Response: *** Plan: {THERAPIES; PSYCH WGNF:62130}  Patients Problems:  Patient Active Problem List   Diagnosis Date Noted   Lower extremity cellulitis 02/10/2023   Severe sepsis (HCC) 02/10/2023   Pressure injury of skin 06/04/2022   Fall 06/03/2022   Acute kidney injury superimposed on chronic kidney disease (HCC) 06/03/2022   COVID-19 virus infection 06/03/2022   Hypercalcemia 06/03/2022   Monoclonal gammopathy 06/03/2022   Constipation 06/03/2022   History of discitis 06/03/2022   History of bacteremia 06/03/2022   History of osteomyelitis 06/03/2022   Transaminitis 06/03/2022   Hyperbilirubinemia 06/03/2022   Normocytic anemia 06/03/2022   Depression 06/03/2022   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

## 2023-06-10 NOTE — ED Notes (Signed)
Pt sleeping in no acute distress. RR even and unlabored. Environment secured. Will continue to monitor for safety. 

## 2023-06-10 NOTE — ED Notes (Signed)
Patient sleeping but easily aroused to name being called. Denies intent to harm self/others when asked. Denies A/VH. Patient denies any physical complaints when asked. Denies withdrawal sx at present. No acute distress noted. Support and encouragement provided. Routine safety checks conducted according to facility protocol. Encouraged patient to notify staff if thoughts of harm toward self or others arise. Patient verbalize understanding and agreement. Will continue to monitor for safety.

## 2023-06-11 DIAGNOSIS — F1510 Other stimulant abuse, uncomplicated: Secondary | ICD-10-CM | POA: Diagnosis not present

## 2023-06-11 LAB — RPR: RPR Ser Ql: NONREACTIVE

## 2023-06-11 MED ORDER — BUPRENORPHINE HCL-NALOXONE HCL 2-0.5 MG SL SUBL
2.0000 | SUBLINGUAL_TABLET | Freq: Once | SUBLINGUAL | Status: AC
  Administered 2023-06-11: 2 via SUBLINGUAL
  Filled 2023-06-11: qty 2

## 2023-06-11 MED ORDER — BUPRENORPHINE HCL-NALOXONE HCL 8-2 MG SL SUBL
1.0000 | SUBLINGUAL_TABLET | Freq: Every day | SUBLINGUAL | Status: DC
  Administered 2023-06-12: 1 via SUBLINGUAL
  Filled 2023-06-11: qty 1

## 2023-06-11 NOTE — Discharge Instructions (Addendum)
Based on the information that you have provided and the presenting issues outpatient services and resources for have been recommended.  It is imperative that you follow through with treatment recommendations within 5-7 days from the of discharge to mitigate further risk to your safety and mental well-being. In case of an urgent crisis, you may contact the Mobile Crisis Unit with [facility] at [phone].  Marcelino Duster with [facility] in [place] has confirmed that you have a follow up visit on Friday 06-14-2023 at 10am  Address: [address]  [place] [address] [phone]  -Follow-up with your outpatient psychiatric provider -instructions on appointment date, time, and address (location) are provided to you in discharge paperwork.  -Take your psychiatric medications as prescribed at discharge - instructions are provided to you in the discharge paperwork  -Follow-up with outpatient primary care doctor and other specialists -for management of preventative medicine and any chronic medical disease.  -Recommend abstinence from alcohol, tobacco, and other illicit drug use at discharge.   -If your psychiatric symptoms recur, worsen, or if you have side effects to your psychiatric medications, call your outpatient psychiatric provider, 911, [phone] or go to the nearest emergency department.  -If suicidal thoughts occur, call your outpatient psychiatric provider, 911, [phone] or go to the nearest emergency department.  Naloxone (Narcan) can help reverse an overdose when given to the victim quickly.  [facility] offers free naloxone kits and instructions/training on its use.  Add naloxone to your first aid kit and you can help save a life.   Pick up your free kit at the following locations:   [place]:  [facility], [address] [place] [address] [phone]) [facility] [address] [place] [address] [phone]) [facility] [address] [place] [address]  [place]: [facility] [address] [place] [address] ([phone]) [facility] [address] [place] [address] [phone])

## 2023-06-11 NOTE — Care Management (Addendum)
[facility] Care Management   9:30am  Writer left a voice mail message at [facility] in [place] [phone]) to schedule a follow up appointment for the patient.   11:05am  Writer spoke to [place] with [employer] in [place].  The patient has a follow up visit on Friday 06-14-2023 at 10am  Address: [address]  [place]  [phone]

## 2023-06-11 NOTE — Group Note (Signed)
Group Topic: Healthy Self Image and Positive Change  Group Date: 06/11/2023 Start Time: 2000 End Time: 2100 Facilitators: Darin Engels  Department: [facility]  Number of Participants: 3  Group Focus: abuse issues, acceptance, coping skills, relaxation, self-awareness, and self-esteem Treatment Modality:  Leisure Development Interventions utilized were story telling and leisure development  Purpose: enhance coping skills, express feelings, increase insight, and relapse prevention strategies  Name: Ronald Hampton Date of Birth: 07/10/1981  MR: [id number]    Level of Participation: pt did not attend group.  Quality of Participation: pt did not attend group. Interactions with others: pt interacts well with others on the unit. Mood/Affect: appropriate and positive Triggers (if applicable): n/a Cognition: coherent/clear Progress: Gaining insight Response: patient did not want to attend group but did come out and interact with other patients and staff briefly.  Plan: patient will be encouraged to attend groups.   Patients Problems:  Patient Active Problem List   Diagnosis Date Noted   Lower extremity cellulitis 02/10/2023   Severe sepsis (HCC) 02/10/2023   Pressure injury of skin 06/04/2022   Fall 06/03/2022   Acute kidney injury superimposed on chronic kidney disease (HCC) 06/03/2022   COVID-19 virus infection 06/03/2022   Hypercalcemia 06/03/2022   Monoclonal gammopathy 06/03/2022   Constipation 06/03/2022   History of discitis 06/03/2022   History of bacteremia 06/03/2022   History of osteomyelitis 06/03/2022   Transaminitis 06/03/2022   Hyperbilirubinemia 06/03/2022   Normocytic anemia 06/03/2022   Depression 06/03/2022   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

## 2023-06-11 NOTE — ED Provider Notes (Signed)
FBC/OBS ASAP Discharge Summary  Date and Time: 06/11/2023 2:46 PM  Name: Ronald Hampton  MRN:  [id number]   Discharge Diagnoses:  Final diagnoses:  Methamphetamine abuse (HCC)  Opioid use disorder, moderate, dependence (HCC)   Subjective: Patient is seen this AM. He denies withdrawal symptoms or other somatic symptoms. He denies SI/HI/AVH. He reports he will maintain his sobriety by staying in groups (NA, AA) and being honest about his use as well as going to [facility]. He reports his brother will come pick him up. His brother was contacted prior to discharge and denied any safety concerns with the patient including access to guns and medication stockpiles.   Stay Summary:  During the patient's hospitalization, patient had extensive initial psychiatric evaluation, and follow-up psychiatric evaluations every day.  Psychiatric diagnoses provided upon initial assessment:  -Opioid use disorder -Stimulant use disorder  Patient's psychiatric medications were adjusted on admission:  -none  During the hospitalization, other adjustments were made to the patient's psychiatric medication regimen:  -started on suboxone, increased to 8-2mg    Patient's care was discussed during the interdisciplinary team meeting every day during the hospitalization.  The patient denied having side effects to prescribed psychiatric medication.  Gradually, patient started adjusting to milieu. The patient was evaluated each day by a clinical provider to ascertain response to treatment. Improvement was noted by the patient's report of decreasing symptoms, improved sleep and appetite, affect, medication tolerance, behavior, and participation in unit programming.  Patient was asked each day to complete a self inventory noting mood, mental status, pain, new symptoms, anxiety and concerns.    Symptoms were reported as significantly decreased or resolved completely by discharge.   On day of discharge, the patient  reports that their mood is stable. The patient denied having suicidal thoughts for more than 48 hours prior to discharge.  Patient denies having homicidal thoughts.  Patient denies having auditory hallucinations.  Patient denies any visual hallucinations or other symptoms of psychosis. The patient was motivated to continue taking medication with a goal of continued improvement in mental health.   Supportive psychotherapy was provided to the patient. The patient also participated in regular group therapy while hospitalized. Coping skills, problem solving as well as relaxation therapies were also part of the unit programming.  Labs were reviewed with the patient, and abnormal results were discussed with the patient.  The patient is able to verbalize their individual safety plan to this provider.  # It is recommended to the patient to continue psychiatric medications as prescribed, after discharge from the hospital.    # It is recommended to the patient to follow up with your outpatient psychiatric provider and PCP.  # It was discussed with the patient, the impact of alcohol, drugs, tobacco have been there overall psychiatric and medical wellbeing, and total abstinence from substance use was recommended the patient.ed.  # Prescriptions provided or sent directly to preferred pharmacy at discharge. Patient agreeable to plan. Given opportunity to ask questions. Appears to feel comfortable with discharge.    # In the event of worsening symptoms, the patient is instructed to call the crisis hotline, 911 and or go to the nearest ED for appropriate evaluation and treatment of symptoms. To follow-up with primary care provider for other medical issues, concerns and or health care needs  # Patient was discharged home with a plan to follow up with [facility] in [place] as noted below.   Past Psychiatric History: Admitted to the behavioral hospital 5 years ago  for suicidal thoughts and was given a diagnosis of  bipolar affective disorder 1. History of suicide attempt by heroin overdose. Has previously been on psychotropic medications including depakote, doxepin, abilify maintenna, abilify, prazosin   Past Medical History: Chronic kidney disease (GFR approximately 55), history of CVA, and history of hepatitis C (reports treated). He ambulates using a walker. Patient also reports having multiple myeloma   Family History: Unknown   Social History: lives in [place] with mom, 2 brothers, and sister. On disability. History of going [facility]   Tobacco Cessation:  N/A, patient does not currently use tobacco products  Current Medications:  Current Facility-Administered Medications  Medication Dose Route Frequency Provider Last Rate Last Admin   acetaminophen (TYLENOL) tablet 650 mg  650 mg Oral Q6H PRN Weber, Kyra A, NP       alum & mag hydroxide-simeth (MAALOX/MYLANTA) 200-200-20 MG/5ML suspension 30 mL  30 mL Oral Q4H PRN Weber, Kyra A, NP       amLODipine (NORVASC) tablet 5 mg  5 mg Oral Daily Karie Fetch, MD   5 mg at 06/11/23 1335   buprenorphine-naloxone (SUBOXONE) 2-0.5 mg per SL tablet 2 tablet  2 tablet Sublingual Once Karie Fetch, MD       Melene Muller ON 06/12/2023] buprenorphine-naloxone (SUBOXONE) 8-2 mg per SL tablet 1 tablet  1 tablet Sublingual Daily Karie Fetch, MD       cloNIDine (CATAPRES) tablet 0.1 mg  0.1 mg Oral Q6H PRN Karie Fetch, MD       dicyclomine (BENTYL) tablet 20 mg  20 mg Oral Q6H PRN Carlyn Reichert, MD       hydrOXYzine (ATARAX) tablet 25 mg  25 mg Oral Q6H PRN Carlyn Reichert, MD       loperamide (IMODIUM) capsule 2-4 mg  2-4 mg Oral PRN Carlyn Reichert, MD       magnesium hydroxide (MILK OF MAGNESIA) suspension 30 mL  30 mL Oral Daily PRN Weber, Bella Kennedy A, NP       methocarbamol (ROBAXIN) tablet 500 mg  500 mg Oral Q8H PRN Carlyn Reichert, MD       naproxen (NAPROSYN) tablet 500 mg  500 mg Oral BID PRN Carlyn Reichert,  MD   500 mg at 06/08/23 1520   Current Outpatient Medications  Medication Sig Dispense Refill   oxyCODONE (ROXICODONE) 5 MG immediate release tablet Take 1 tablet (5 mg total) by mouth every 6 (six) hours as needed. 15 tablet 0    PTA Medications:  Facility Ordered Medications  Medication   [COMPLETED] ondansetron (ZOFRAN-ODT) disintegrating tablet 4 mg   acetaminophen (TYLENOL) tablet 650 mg   alum & mag hydroxide-simeth (MAALOX/MYLANTA) 200-200-20 MG/5ML suspension 30 mL   magnesium hydroxide (MILK OF MAGNESIA) suspension 30 mL   dicyclomine (BENTYL) tablet 20 mg   hydrOXYzine (ATARAX) tablet 25 mg   loperamide (IMODIUM) capsule 2-4 mg   methocarbamol (ROBAXIN) tablet 500 mg   naproxen (NAPROSYN) tablet 500 mg   [COMPLETED] cloNIDine (CATAPRES) tablet 0.1 mg   [COMPLETED] cloNIDine (CATAPRES) tablet 0.1 mg   amLODipine (NORVASC) tablet 5 mg   cloNIDine (CATAPRES) tablet 0.1 mg   [COMPLETED] buprenorphine-naloxone (SUBOXONE) 2-0.5 mg per SL tablet 2 tablet   buprenorphine-naloxone (SUBOXONE) 2-0.5 mg per SL tablet 2 tablet   [START ON 06/12/2023] buprenorphine-naloxone (SUBOXONE) 8-2 mg per SL tablet 1 tablet   PTA Medications  Medication Sig   oxyCODONE (ROXICODONE) 5 MG immediate release tablet Take 1 tablet (5 mg total)  by mouth every 6 (six) hours as needed.       06/08/2023   12:13 PM  Depression screen PHQ 2/9  Decreased Interest 1  Down, Depressed, Hopeless 1  PHQ - 2 Score 2  Altered sleeping 2  Tired, decreased energy 2  Change in appetite 2  Feeling bad or failure about yourself  1  Trouble concentrating 1  Moving slowly or fidgety/restless 0  Suicidal thoughts 0  PHQ-9 Score 10    Flowsheet Row ED from 06/07/2023 in [facility] Most recent reading at 06/07/2023  6:12 PM ED from 06/07/2023 in [facility] Emergency Department at [name] Most recent reading at 06/07/2023 12:37 AM ED from 02/27/2023 in [facility]  Emergency Department at [facility] Most recent reading at 02/27/2023  2:09 PM  C-SSRS RISK CATEGORY No Risk No Risk No Risk       Musculoskeletal  Strength & Muscle Tone: within normal limits Gait & Station: normal Patient leans: N/A  Psychiatric Specialty Exam: Physical Exam Constitutional:      Appearance: the patient is not toxic-appearing.  Pulmonary:     Effort: Pulmonary effort is normal.  Neurological:     General: No focal deficit present.     Mental Status: the patient is alert and oriented to person, place, and time.   Review of Systems  Respiratory:  Negative for shortness of breath.   Cardiovascular:  Negative for chest pain.  Gastrointestinal:  Negative for abdominal pain, constipation, diarrhea, nausea and vomiting.  Neurological:  Negative for headaches.      BP 132/87 (BP Location: Left Arm)   Pulse 99   Temp 98.1 F (36.7 C) (Oral)   Resp 18   SpO2 98%   General Appearance: Fairly Groomed  Eye Contact:  Good  Speech:  Clear and Coherent  Volume:  Normal  Mood:  Euthymic  Affect:  Congruent  Thought Process:  Coherent  Orientation:  Full (Time, Place, and Person)  Thought Content: Logical   Suicidal Thoughts:  No  Homicidal Thoughts:  No  Memory:  Immediate;   Good  Judgement:  fair  Insight:  fair  Psychomotor Activity:  Normal  Concentration:  Concentration: Good  Recall:  Good  Fund of Knowledge: Good  Language: Good  Akathisia:  No  AIMS (if indicated): not done  Assets:  Communication Skills Desire for Improvement Leisure Time Physical Health  ADL's:  Intact  Cognition: WNL  Sleep:  Fair   Treatment Plan Summary: Daily contact with patient to assess and evaluate symptoms and progress in treatment and Medication management  Demographic Factors:  Male  Loss Factors: NA  Historical Factors: Prior suicide attempts and Impulsivity  Risk Reduction Factors:   Living with another person, especially a relative and  Positive social support  Continued Clinical Symptoms:  Alcohol/Substance Abuse/Dependencies Previous Psychiatric Diagnoses and Treatments  Cognitive Features That Contribute To Risk:  Thought constriction (tunnel vision)    Suicide Risk:  Mild:  There are no identifiable plans, no associated intent, mild dysphoria and related symptoms, good self-control (both objective and subjective assessment), few other risk factors, and identifiable protective factors, including available and accessible social support.  Plan Of Care/Follow-up recommendations:  Activity: as tolerated  Diet: heart healthy  Other: -Follow-up with your outpatient psychiatric provider -instructions on appointment date, time, and address (location) are provided to you in discharge paperwork.  -Take your psychiatric medications as prescribed at discharge - instructions are provided to  you in the discharge paperwork  -Follow-up with outpatient primary care doctor and other specialists -for management of preventative medicine and chronic medical disease  -Testing: Follow-up with outpatient provider for abnormal lab results:  Cr 1.55, Hgb 12.4, Hct 37.2, HCV ab reactive   -If you are prescribed an atypical antipsychotic medication, we recommend that your outpatient psychiatrist follow routine screening for side effects within 3 months of discharge, including monitoring: AIMS scale, height, weight, blood pressure, fasting lipid panel, HbA1c, and fasting blood sugar.   -Recommend total abstinence from alcohol, tobacco, and other illicit drug use at discharge.   -If your psychiatric symptoms recur, worsen, or if you have side effects to your psychiatric medications, call your outpatient psychiatric provider, 911, [phone] or go to the nearest emergency department.  -If suicidal thoughts occur, immediately call your outpatient psychiatric provider, 911, [phone] or go to the nearest emergency department.  Disposition: Home then  [facility] follow up visit on Friday 06-14-2023 at 10am   Karie Fetch, MD, PGY-2 06/11/2023, 2:46 PM

## 2023-06-11 NOTE — ED Notes (Signed)
Patient resting quietly in bed. Respirations seven and non-labored. 15 minute safety checks continue.

## 2023-06-11 NOTE — Group Note (Signed)
Group Topic: Positive Affirmations  Group Date: 06/11/2023 Start Time: 1200 End Time: 1230 Facilitators: Maeola Sarah  Department: [facility]  Number of Participants: 3  Group Focus: affirmation Treatment Modality:  Psychoeducation Interventions utilized were group exercise Purpose: increase insight, regain self-worth, and reinforce self-care  Name: Ronald Hampton Date of Birth: 1980-09-12  MR: [id number]    Level of Participation: active Quality of Participation: cooperative and engaged Interactions with others: gave feedback Mood/Affect: appropriate and positive Triggers (if applicable): N/A Cognition: coherent/clear and logical Progress: Gaining insight Response: Patient listened as staff explained what positive affirmations were and how they can be used to reverse negative internal messages. Patient stated that his positive affirmations is "I look good". Plan: patient will be encouraged to continue attending all groups  Patients Problems:  Patient Active Problem List   Diagnosis Date Noted   Lower extremity cellulitis 02/10/2023   Severe sepsis (HCC) 02/10/2023   Pressure injury of skin 06/04/2022   Fall 06/03/2022   Acute kidney injury superimposed on chronic kidney disease (HCC) 06/03/2022   COVID-19 virus infection 06/03/2022   Hypercalcemia 06/03/2022   Monoclonal gammopathy 06/03/2022   Constipation 06/03/2022   History of discitis 06/03/2022   History of bacteremia 06/03/2022   History of osteomyelitis 06/03/2022   Transaminitis 06/03/2022   Hyperbilirubinemia 06/03/2022   Normocytic anemia 06/03/2022   Depression 06/03/2022   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

## 2023-06-11 NOTE — ED Notes (Signed)
Patient was provided with chicken tenders, mac & cheese, broccoli and juice

## 2023-06-11 NOTE — Group Note (Addendum)
Group Topic: Understanding Self  Group Date: 06/11/2023 Start Time: 1300 End Time: 1325 Facilitators: Mayer Camel L, RN  Department: [facility]  Number of Participants: 3  Group Focus: acceptance, check in, clarity of thought, daily focus, family, feeling awareness/expression, nursing group, and self-awareness Treatment Modality:  Psychoeducation Interventions utilized were exploration, reminiscence, and support Purpose: express feelings, increase insight, and reinforce self-care  Name: Ronald Hampton Date of Birth: 1981-04-14  MR: [id number]    Level of Participation: active Quality of Participation: cooperative, engaged, and offered feedback Interactions with others: gave feedback Mood/Affect: appropriate and positive Triggers (if applicable): N/A Cognition: coherent/clear, goal directed, insightful, and logical Progress: Gaining insight Response: Patient actively participated in group. Patient appeared to gain insight into self-awareness and thoughts about future planning.  Plan: follow-up needed  Patients Problems:  Patient Active Problem List   Diagnosis Date Noted  . Lower extremity cellulitis 02/10/2023  . Severe sepsis (HCC) 02/10/2023  . Pressure injury of skin 06/04/2022  . Fall 06/03/2022  . Acute kidney injury superimposed on chronic kidney disease (HCC) 06/03/2022  . COVID-19 virus infection 06/03/2022  . Hypercalcemia 06/03/2022  . Monoclonal gammopathy 06/03/2022  . Constipation 06/03/2022  . History of discitis 06/03/2022  . History of bacteremia 06/03/2022  . History of osteomyelitis 06/03/2022  . Transaminitis 06/03/2022  . Hyperbilirubinemia 06/03/2022  . Normocytic anemia 06/03/2022  . Depression 06/03/2022  . 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 Chronic hepatitis C (HCC) 06/03/2022  . History of CVA (cerebrovascular accident) 06/03/2022  . Polysubstance abuse (HCC) 06/03/2022  . MDD (major depressive  disorder), severe (HCC) 11/10/2017  . DM (diabetes mellitus), type 2, uncontrolled 10/23/2017  . HTN (hypertension) 10/23/2017  . OCD (obsessive compulsive disorder) 10/23/2017  . Bipolar I disorder, current or most recent episode depressed, with psychotic features (HCC) 10/22/2017  . PTSD (post-traumatic stress disorder) 10/22/2017  . Opioid use disorder, moderate, dependence (HCC) 10/22/2017  . Suicidal ideation 10/22/2017

## 2023-06-11 NOTE — ED Notes (Signed)
Patient was provided with cereal, milk, juice and nutri-grain bar for breakfast

## 2023-06-11 NOTE — ED Notes (Addendum)
Patient A&O x 4, calm and semi-cooperative. Patient declined medications this a.m. Patient appears depressed acnegenic,and apathetic. Patient preference is to lay in bed daily. Patient uses a walker for ambulatory assistance with steady gait. Patient denies SI, HI, AVH. Patient observed grimacing but denied discomfort. 15 minute safety checks in place. Patient able to verbally contract for safety.

## 2023-06-11 NOTE — ED Provider Notes (Signed)
Behavioral Health Progress Note  Date and Time: 06/11/2023 9:25 AM Name: Ronald Hampton MRN:  [id number]  Subjective:   The patient is a 42 year old male with a history of opioid and methamphetamine use.  He was admitted to the behavioral hospital 5 years ago for suicidal thoughts and was given a diagnosis of bipolar affective disorder 1.  He reports that he has disability income and lives in [place].  The patient has denied suicidal thoughts consistently since his arrival to the [facility] emergency department.  The patient was admitted from that facility to the facility based crisis for substance use treatment.  The patient desires to attend an intensive outpatient program in [place].  The patient has noted medical comorbidities of chronic kidney disease (GFR approximately 55), history of CVA, and history of hepatitis C.  He ambulates using a walker.  Patient also reports having multiple myeloma.  He denies experiencing any sick symptoms.  He denies taking any medications for his medical problems on a regular basis.  Patient reports poor sleep, reports waking up intermittently throughout the night. He denies other somatic complaints. Discussed continued elevated blood pressure and need to follow-up with PCP after discharge. He denies any headache, chest pain, SOB. The patient denies auditory/visual hallucinations.  The patient reports fair mood, appetite, and sleep. They deny suicidal and homicidal thoughts. The patient denies side effects from their medications.  Review of systems as below. The patient denies experiencing any withdrawal symptoms. Discussed can plan to start suboxone if we have outpatient follow-up set up.    Diagnosis:  Final diagnoses:  Methamphetamine abuse (HCC)  Opioid use disorder, moderate, dependence (HCC)    Past Psychiatric History: Admitted to the behavioral hospital 5 years ago for suicidal thoughts and was given a diagnosis of bipolar affective disorder  1. History of suicide attempt by heroin overdose. Has previously been on psychotropic medications including depakote, doxepin, abilify maintenna, abilify, prazosin Past Medical History: Chronic kidney disease (GFR approximately 55), history of CVA, and history of hepatitis C (reports treated).  He ambulates using a walker.  Patient also reports having multiple myeloma Family History: Unknown Social History: lives in [place] with mom, 2 brothers, and sister. On disability. History of going to [facility]   Additional Social History:     none                    Sleep: poor  Appetite:  fair  Current Medications:  Current Facility-Administered Medications  Medication Dose Route Frequency Provider Last Rate Last Admin   acetaminophen (TYLENOL) tablet 650 mg  650 mg Oral Q6H PRN Weber, Kyra A, NP       alum & mag hydroxide-simeth (MAALOX/MYLANTA) 200-200-20 MG/5ML suspension 30 mL  30 mL Oral Q4H PRN Weber, Kyra A, NP       amLODipine (NORVASC) tablet 5 mg  5 mg Oral Daily Karie Fetch, MD   5 mg at 06/10/23 1022   cloNIDine (CATAPRES) tablet 0.1 mg  0.1 mg Oral Q6H PRN Karie Fetch, MD       dicyclomine (BENTYL) tablet 20 mg  20 mg Oral Q6H PRN Carlyn Reichert, MD       hydrOXYzine (ATARAX) tablet 25 mg  25 mg Oral Q6H PRN Carlyn Reichert, MD       loperamide (IMODIUM) capsule 2-4 mg  2-4 mg Oral PRN Carlyn Reichert, MD       magnesium hydroxide (MILK OF MAGNESIA) suspension 30 mL  30 mL Oral Daily PRN Weber, Bella Kennedy A, NP       methocarbamol (ROBAXIN) tablet 500 mg  500 mg Oral Q8H PRN Carlyn Reichert, MD       naproxen (NAPROSYN) tablet 500 mg  500 mg Oral BID PRN Carlyn Reichert, MD   500 mg at 06/08/23 1520   Current Outpatient Medications  Medication Sig Dispense Refill   oxyCODONE (ROXICODONE) 5 MG immediate release tablet Take 1 tablet (5 mg total) by mouth every 6 (six) hours as needed. 15 tablet 0    Labs  Lab Results:  Admission  on 06/07/2023  Component Date Value Ref Range Status   HIV-1 P24 Antigen - HIV24 06/10/2023 NON REACTIVE  NON REACTIVE Final   Comment: (NOTE) Detection of p24 may be inhibited by biotin in the sample, causing false negative results in acute infection.    HIV 1/2 Antibodies 06/10/2023 NON REACTIVE  NON REACTIVE Final   Interpretation (HIV Ag Ab) 06/10/2023 A non reactive test result means that HIV 1 or HIV 2 antibodies and HIV 1 p24 antigen were not detected in the specimen.   Final   Performed at [facility], [address] [place], [place] [address]   Hepatitis B Surface Ag 06/10/2023 NON REACTIVE  NON REACTIVE Final   HCV Ab 06/10/2023 Reactive (A)  NON REACTIVE Final   Comment: (NOTE) The CDC recommends that a Reactive HCV antibody result be followed up  with a HCV Nucleic Acid Amplification test.     Hep A IgM 06/10/2023 NON REACTIVE  NON REACTIVE Final   Hep B C IgM 06/10/2023 NON REACTIVE  NON REACTIVE Final   Performed at [facility], [address] [place], [place] [address]  Admission on 06/07/2023, Discharged on 06/07/2023  Component Date Value Ref Range Status   Sodium 06/07/2023 135  135 - 145 mmol/L Final   Potassium 06/07/2023 3.8  3.5 - 5.1 mmol/L Final   Chloride 06/07/2023 104  98 - 111 mmol/L Final   CO2 06/07/2023 21 (L)  22 - 32 mmol/L Final   Glucose, Bld 06/07/2023 131 (H)  70 - 99 mg/dL Final   Glucose reference range applies only to samples taken after fasting for at least 8 hours.   BUN 06/07/2023 20  6 - 20 mg/dL Final   Creatinine, Ser 06/07/2023 1.55 (H)  0.61 - 1.24 mg/dL Final   Calcium 73/22/0254 9.3  8.9 - 10.3 mg/dL Final   Total Protein 27/12/2374 7.5  6.5 - 8.1 g/dL Final   Albumin 28/31/5176 3.5  3.5 - 5.0 g/dL Final   AST 16/01/3709 18  15 - 41 U/L Final   ALT 06/07/2023 12  0 - 44 U/L Final   Alkaline Phosphatase 06/07/2023 116  38 - 126 U/L Final   Total Bilirubin 06/07/2023 0.5  <1.2 mg/dL Final   GFR, Estimated 06/07/2023  57 (L)  >60 mL/min Final   Comment: (NOTE) Calculated using the CKD-EPI Creatinine Equation (2021)    Anion gap 06/07/2023 10  5 - 15 Final   Performed at [facility], [address] [place], [place] [address]   Alcohol, Ethyl (B) 06/07/2023 <10  <10 mg/dL Final   Comment: (NOTE) Lowest detectable limit for serum alcohol is 10 mg/dL.  For medical purposes only. Performed at [facility], [address] [place], [place] [address]    WBC 06/07/2023 8.0  4.0 - 10.5 K/uL Final   RBC 06/07/2023 4.48  4.22 - 5.81 MIL/uL Final   Hemoglobin 06/07/2023 12.4 (L)  13.0 - 17.0 g/dL Final   HCT 29/56/2130 37.2 (L)  39.0 - 52.0 % Final   MCV 06/07/2023 83.0  80.0 - 100.0 fL Final   MCH 06/07/2023 27.7  26.0 - 34.0 pg Final   MCHC 06/07/2023 33.3  30.0 - 36.0 g/dL Final   RDW 86/57/8469 14.7  11.5 - 15.5 % Final   Platelets 06/07/2023 278  150 - 400 K/uL Final   nRBC 06/07/2023 0.0  0.0 - 0.2 % Final   Performed at [facility], [address], [place], [place] [address]   Opiates 06/07/2023 NONE DETECTED  NONE DETECTED Final   Cocaine 06/07/2023 NONE DETECTED  NONE DETECTED Final   Benzodiazepines 06/07/2023 NONE DETECTED  NONE DETECTED Final   Amphetamines 06/07/2023 POSITIVE (A)  NONE DETECTED Final   Tetrahydrocannabinol 06/07/2023 NONE DETECTED  NONE DETECTED Final   Barbiturates 06/07/2023 NONE DETECTED  NONE DETECTED Final   Comment: (NOTE) DRUG SCREEN FOR MEDICAL PURPOSES ONLY.  IF CONFIRMATION IS NEEDED FOR ANY PURPOSE, NOTIFY LAB WITHIN 5 DAYS.  LOWEST DETECTABLE LIMITS FOR URINE DRUG SCREEN Drug Class                     Cutoff (ng/mL) Amphetamine and metabolites    1000 Barbiturate and metabolites    200 Benzodiazepine                 200 Opiates and metabolites        300 Cocaine and metabolites        300 THC                            50 Performed at [facility], [address], [place], [place] [address]   Admission on 02/27/2023,  Discharged on 02/27/2023  Component Date Value Ref Range Status   Sodium 02/27/2023 135  135 - 145 mmol/L Final   Potassium 02/27/2023 4.1  3.5 - 5.1 mmol/L Final   Chloride 02/27/2023 100  98 - 111 mmol/L Final   CO2 02/27/2023 25  22 - 32 mmol/L Final   Glucose, Bld 02/27/2023 118 (H)  70 - 99 mg/dL Final   Glucose reference range applies only to samples taken after fasting for at least 8 hours.   BUN 02/27/2023 21 (H)  6 - 20 mg/dL Final   Creatinine, Ser 02/27/2023 1.72 (H)  0.61 - 1.24 mg/dL Final   Calcium 44/07/270 9.1  8.9 - 10.3 mg/dL Final   GFR, Estimated 02/27/2023 51 (L)  >60 mL/min Final   Comment: (NOTE) Calculated using the CKD-EPI Creatinine Equation (2021)    Anion gap 02/27/2023 10  5 - 15 Final   Performed at [facility], [address] [place], [place] [address]   WBC 02/27/2023 7.3  4.0 - 10.5 K/uL Final   RBC 02/27/2023 3.52 (L)  4.22 - 5.81 MIL/uL Final   Hemoglobin 02/27/2023 10.2 (L)  13.0 - 17.0 g/dL Final   HCT 40/34/7425 30.9 (L)  39.0 - 52.0 % Final   MCV 02/27/2023 87.8  80.0 - 100.0 fL Final   MCH 02/27/2023 29.0  26.0 - 34.0 pg Final   MCHC 02/27/2023 33.0  30.0 - 36.0 g/dL Final   RDW 95/63/8756 13.2  11.5 - 15.5 % Final   Platelets 02/27/2023 360  150 - 400 K/uL Final  nRBC 02/27/2023 0.0  0.0 - 0.2 % Final   Performed at [facility], [address], [place], [place] [address]   Troponin I (High Sensitivity) 02/27/2023 4  <18 ng/L Final   Comment: (NOTE) Elevated high sensitivity troponin I (hsTnI) values and significant  changes across serial measurements may suggest ACS but many other  chronic and acute conditions are known to elevate hsTnI results.  Refer to the "Links" section for chest pain algorithms and additional  guidance. Performed at [facility], [address], [place], [place] [address]    Lactic Acid, Venous 02/27/2023 1.8  0.5 - 1.9 mmol/L Final  No results displayed because visit has over 200 results.       Blood Alcohol level:  Lab Results  Component Value Date   ETH <10 06/07/2023   ETH <10 11/09/2017    Metabolic Disorder Labs: Lab Results  Component Value Date   HGBA1C 5.3 02/10/2023   MPG 105 02/10/2023   MPG 286.22 10/23/2017   No results found for: "PROLACTIN" Lab Results  Component Value Date   CHOL 176 10/23/2017   TRIG 193 (H) 10/23/2017   HDL 36 (L) 10/23/2017   CHOLHDL 4.9 10/23/2017   VLDL 39 10/23/2017   LDLCALC 101 (H) 10/23/2017    Therapeutic Lab Levels: No results found for: "LITHIUM" No results found for: "VALPROATE" No results found for: "CBMZ"  Physical Findings   AIMS    Flowsheet Row [facility] Ad[facility] (Discharged) from 10/22/2017 in [facility]  AIMS Total Score 0 0      AUDIT    Flowsheet Row [facility]) [facility] Admission (Discharged) from 10/22/2017 in [facility]  Alcohol Use Disorder Identification Test Final Score (AUDIT) 7 13      PHQ2-9    Flowsheet Row ED from 06/07/2023 in [facility]  PHQ-2 Total Score 2  PHQ-9 Total Score 10      Flowsheet Row ED from 06/07/2023 in [facility] Most recent reading at 06/07/2023  6:12 PM ED from 06/07/2023 in [facility] Emergency Department at [facility] Most recent reading at 06/07/2023 12:37 AM ED from 02/27/2023 in [facility] Department at [facility] Most recent reading at 02/27/2023  2:09 PM  C-SSRS RISK CATEGORY No Risk No Risk No Risk        Psychiatric Specialty Exam: Physical Exam Constitutional:      Appearance: the patient is not toxic-appearing.  Pulmonary:     Effort: Pulmonary effort is normal.  Neurological:     General: No focal deficit present.     Mental Status: the patient is alert and  oriented to person, place, and time.   Review of Systems  Respiratory:  Negative for shortness of breath.   Cardiovascular:  Negative for chest pain.  Gastrointestinal:  Negative for abdominal pain, constipation, diarrhea, nausea and vomiting.  Neurological:  Negative for headaches.      BP (!) 140/104 (BP Location: Right Arm)   Pulse 98   Temp 98.3 F (36.8 C) (Oral)   Resp 20   SpO2 98%   General Appearance: Fairly Groomed  Eye Contact:  Good  Speech:  Clear and Coherent  Volume:  Normal  Mood:  Euthymic  Affect:  Congruent  Thought Process:  Coherent  Orientation:  Full (Time, Place, and  Person)  Thought Content: Logical   Suicidal Thoughts:  No  Homicidal Thoughts:  No  Memory:  Immediate;   Good  Judgement:  fair  Insight:  fair  Psychomotor Activity:  Normal  Concentration:  Concentration: Good  Recall:  Good  Fund of Knowledge: Good  Language: Good  Akathisia:  No  Handed:  not assessed  AIMS (if indicated): not done  Assets:  Communication Skills Desire for Improvement Financial Resources/Insurance Housing Leisure Time Physical Health  ADL's:  Intact  Cognition: WNL  Sleep:  Fair    Treatment Plan Summary: Daily contact with patient to assess and evaluate symptoms and progress in treatment and Medication management.  The patient is a 42 year old male with a past psychiatric history of opioid and methamphetamine use, [facility] admission 5 years ago for suicidal thoughts, history of intentional overdose on heroin as suicide attempt and past medical history of chronic kidney disease (GFR approximately 54), history of CVA, history of hepatitis C, HTN who presents for opioid and stimulant use detox.   Patient continues to deny any withdrawal symptoms, meets criteria for opioid use disorder, stimulant use disorder. Could benefit from starting suboxone in the outpatient setting. Desires to go to intensive outpatient program for continued maintenance of his sobriety.    Labs notable for Cr 1.55, AST 18, ALT 12, Hgb 12.4. Qtc 462. Alc<10.   #Opioid use disorder #Stimulant use disorder - COWS  - As needed medication available for symptomatic treatment -- consider starting suboxone if outpatient follow-up is set up  #HTN BP decreased afternoon yesterday though elevated this AM. History of HTN on lisinopril. Given CKD, started amlodipine for HTN.  -continue amlodipine 5mg  for HTN -PRN clonidine   #CKD IIIa Around baseline.   #Recent IVDU  #History of hepatitis C -hepatitis panel +HCV ab, otherwise wnl. HIV NR. RPR pending.  -treated for hepatitis C in the past, will need outpatient follow-up with ID   Dispo: Patient desires to go to intensive outpatient program in [place]. Plan to d/c 11/27.     Karie Fetch, MD, PGY-2 06/11/2023 9:25 AM

## 2023-06-11 NOTE — ED Notes (Signed)
Patient was provided with a cheeseburger, mashed potatoes, corn and juice for lunch

## 2023-06-11 NOTE — ED Notes (Signed)
Patient is sleeping. Respirations equal and unlabored, skin warm and dry. No change in assessment or acuity. Routine safety checks conducted according to facility protocol. Will continue to monitor for safety.   

## 2023-06-11 NOTE — ED Notes (Signed)
Patient is A&O x 4 Calm and cooperative. Patient attended a group meeting and had good participation. Patient B/P and HR elevated. Amlodipine administered, provider aware. Patient started on Suboxone tab 2mg /0.5 mg S/L.

## 2023-06-11 NOTE — ED Notes (Signed)
Patient resting quietly in bed with even non-labored respirations. Patient started on Suboxone total of 8 mg administered. Patient states he feels much better. Patient continues to deny discomfort, SI, HI, AVH. Patient willingly participated in groups x 2 with good feedback. Patient appears goal directed and enthusiastic for continued SUD care. 15 minute safety checks in progress.

## 2023-06-11 NOTE — ED Notes (Signed)
Pt is in the dayroom watching TV with peers. Pt denies SI/HI/AVH. Pt has no further complain.No acute distress noted. Will continue to monitor for safety and provide support.

## 2023-06-12 ENCOUNTER — Encounter (HOSPITAL_COMMUNITY): Payer: Self-pay

## 2023-06-12 DIAGNOSIS — F1510 Other stimulant abuse, uncomplicated: Secondary | ICD-10-CM | POA: Diagnosis not present

## 2023-06-12 MED ORDER — AMLODIPINE BESYLATE 5 MG PO TABS: 5.0000 mg | ORAL_TABLET | Freq: Every day | ORAL | 0 refills | Status: AC

## 2023-06-12 MED ORDER — BUPRENORPHINE HCL-NALOXONE HCL 8-2 MG SL SUBL: 1.0000 | SUBLINGUAL_TABLET | Freq: Every day | SUBLINGUAL | 0 refills | Status: AC

## 2023-06-12 NOTE — ED Notes (Signed)
Patient remains asleep in bed without complaint or distress.  No evidence of withdrawal at this time.

## 2023-06-12 NOTE — ED Notes (Signed)
Patient is sleeping. Respirations equal and unlabored, skin warm and dry. No change in assessment or acuity. Routine safety checks conducted according to facility protocol. Will continue to monitor for safety.   

## 2023-07-18 ENCOUNTER — Ambulatory Visit (HOSPITAL_COMMUNITY)
Admission: EM | Admit: 2023-07-18 | Discharge: 2023-07-18 | Discharge status: Against Medical Advice | Payer: No Typology Code available for payment source | Attending: Internal Medicine | Admitting: Internal Medicine

## 2023-07-18 ENCOUNTER — Encounter (HOSPITAL_COMMUNITY): Payer: Self-pay

## 2023-07-18 DIAGNOSIS — Z5321 Procedure and treatment not carried out due to patient leaving prior to being seen by health care provider: Secondary | ICD-10-CM | POA: Diagnosis not present

## 2023-07-18 DIAGNOSIS — R1110 Vomiting, unspecified: Secondary | ICD-10-CM

## 2023-07-18 LAB — POCT FASTING CBG KUC MANUAL ENTRY: POCT Glucose (KUC): 123 mg/dL — AB (ref 70–99)

## 2023-07-18 NOTE — ED Triage Notes (Signed)
 Patient having dizziness, light headed, blurred vision and fatigue for 2 days onset with nausea and vomiting, unable to keep down solids or fluids.   Patient is type 2 diabetic. Not on any diabetic meds

## 2023-07-18 NOTE — ED Notes (Addendum)
Patient LWBS after triage.

## 2023-07-18 NOTE — ED Notes (Signed)
 Called pt from lobby no answer

## 2023-08-16 ENCOUNTER — Encounter (HOSPITAL_COMMUNITY): Payer: Self-pay
